# Patient Record
Sex: Male | Born: 1940 | Race: White | Hispanic: No | Marital: Married | Smoking: Former smoker
Health system: Southern US, Community
[De-identification: ages and names within clinical notes are randomized; demographics above are authoritative.]

## PROBLEM LIST (undated history)

## (undated) DIAGNOSIS — F29 Unspecified psychosis not due to a substance or known physiological condition: Secondary | ICD-10-CM

## (undated) DIAGNOSIS — I6529 Occlusion and stenosis of unspecified carotid artery: Secondary | ICD-10-CM

## (undated) DIAGNOSIS — I4891 Unspecified atrial fibrillation: Secondary | ICD-10-CM

## (undated) DIAGNOSIS — F0390 Unspecified dementia without behavioral disturbance: Secondary | ICD-10-CM

## (undated) DIAGNOSIS — F419 Anxiety disorder, unspecified: Secondary | ICD-10-CM

## (undated) DIAGNOSIS — G4700 Insomnia, unspecified: Secondary | ICD-10-CM

## (undated) DIAGNOSIS — I471 Supraventricular tachycardia, unspecified: Secondary | ICD-10-CM

## (undated) DIAGNOSIS — J449 Chronic obstructive pulmonary disease, unspecified: Secondary | ICD-10-CM

## (undated) DIAGNOSIS — I10 Essential (primary) hypertension: Secondary | ICD-10-CM

## (undated) DIAGNOSIS — Z860100 Personal history of colon polyps, unspecified: Secondary | ICD-10-CM

## (undated) DIAGNOSIS — I219 Acute myocardial infarction, unspecified: Secondary | ICD-10-CM

## (undated) DIAGNOSIS — M1990 Unspecified osteoarthritis, unspecified site: Secondary | ICD-10-CM

## (undated) DIAGNOSIS — R040 Epistaxis: Secondary | ICD-10-CM

## (undated) DIAGNOSIS — F329 Major depressive disorder, single episode, unspecified: Secondary | ICD-10-CM

## (undated) DIAGNOSIS — I2510 Atherosclerotic heart disease of native coronary artery without angina pectoris: Secondary | ICD-10-CM

## (undated) DIAGNOSIS — K649 Unspecified hemorrhoids: Secondary | ICD-10-CM

## (undated) DIAGNOSIS — K219 Gastro-esophageal reflux disease without esophagitis: Secondary | ICD-10-CM

## (undated) DIAGNOSIS — R7302 Impaired glucose tolerance (oral): Secondary | ICD-10-CM

## (undated) DIAGNOSIS — G4730 Sleep apnea, unspecified: Secondary | ICD-10-CM

## (undated) DIAGNOSIS — E785 Hyperlipidemia, unspecified: Secondary | ICD-10-CM

## (undated) DIAGNOSIS — C4490 Unspecified malignant neoplasm of skin, unspecified: Secondary | ICD-10-CM

## (undated) DIAGNOSIS — R413 Other amnesia: Secondary | ICD-10-CM

## (undated) DIAGNOSIS — F32A Depression, unspecified: Secondary | ICD-10-CM

## (undated) HISTORY — PX: OTHER SURGICAL HISTORY: SHX169

## (undated) HISTORY — DX: Occlusion and stenosis of unspecified carotid artery: I65.29

## (undated) HISTORY — DX: Sleep apnea, unspecified: G47.30

## (undated) HISTORY — DX: Supraventricular tachycardia: I47.1

## (undated) HISTORY — DX: Anxiety disorder, unspecified: F41.9

## (undated) HISTORY — DX: Unspecified osteoarthritis, unspecified site: M19.90

## (undated) HISTORY — DX: Gastro-esophageal reflux disease without esophagitis: K21.9

## (undated) HISTORY — PX: EYE SURGERY: SHX253

## (undated) HISTORY — DX: Hyperlipidemia, unspecified: E78.5

## (undated) HISTORY — DX: Unspecified malignant neoplasm of skin, unspecified: C44.90

## (undated) HISTORY — DX: Supraventricular tachycardia, unspecified: I47.10

## (undated) HISTORY — PX: COLONOSCOPY: SHX174

## (undated) HISTORY — PX: FINGER SURGERY: SHX640

## (undated) HISTORY — DX: Atherosclerotic heart disease of native coronary artery without angina pectoris: I25.10

## (undated) HISTORY — PX: TONSILLECTOMY AND ADENOIDECTOMY: SUR1326

## (undated) HISTORY — DX: Essential (primary) hypertension: I10

## (undated) HISTORY — PX: BACK SURGERY: SHX140

## (undated) HISTORY — PX: CIRCUMCISION: SUR203

## (undated) HISTORY — DX: Impaired glucose tolerance (oral): R73.02

## (undated) HISTORY — DX: Chronic obstructive pulmonary disease, unspecified: J44.9

---

## 1968-08-06 HISTORY — PX: HEMORRHOID SURGERY: SHX153

## 1976-08-06 HISTORY — PX: CERVICAL SPINE SURGERY: SHX589

## 1998-03-28 ENCOUNTER — Ambulatory Visit (HOSPITAL_COMMUNITY): Admission: RE | Admit: 1998-03-28 | Discharge: 1998-03-28 | Payer: Self-pay | Admitting: Gastroenterology

## 1999-08-24 ENCOUNTER — Encounter: Admission: RE | Admit: 1999-08-24 | Discharge: 1999-08-24 | Payer: Self-pay

## 2000-02-18 ENCOUNTER — Emergency Department (HOSPITAL_COMMUNITY): Admission: EM | Admit: 2000-02-18 | Discharge: 2000-02-18 | Payer: Self-pay | Admitting: Emergency Medicine

## 2000-02-18 ENCOUNTER — Encounter: Payer: Self-pay | Admitting: Emergency Medicine

## 2000-07-01 ENCOUNTER — Inpatient Hospital Stay (HOSPITAL_COMMUNITY): Admission: EM | Admit: 2000-07-01 | Discharge: 2000-07-03 | Payer: Self-pay | Admitting: Emergency Medicine

## 2000-07-01 ENCOUNTER — Encounter: Payer: Self-pay | Admitting: Emergency Medicine

## 2002-10-31 ENCOUNTER — Emergency Department (HOSPITAL_COMMUNITY): Admission: EM | Admit: 2002-10-31 | Discharge: 2002-10-31 | Payer: Self-pay | Admitting: Emergency Medicine

## 2003-02-09 ENCOUNTER — Encounter: Payer: Self-pay | Admitting: Family Medicine

## 2003-02-09 ENCOUNTER — Ambulatory Visit (HOSPITAL_COMMUNITY): Admission: RE | Admit: 2003-02-09 | Discharge: 2003-02-09 | Payer: Self-pay | Admitting: Family Medicine

## 2004-06-27 ENCOUNTER — Ambulatory Visit: Payer: Self-pay | Admitting: Internal Medicine

## 2004-08-08 ENCOUNTER — Observation Stay (HOSPITAL_COMMUNITY): Admission: EM | Admit: 2004-08-08 | Discharge: 2004-08-09 | Payer: Self-pay | Admitting: Emergency Medicine

## 2004-08-08 ENCOUNTER — Ambulatory Visit: Payer: Self-pay | Admitting: Cardiovascular Disease

## 2004-08-09 ENCOUNTER — Ambulatory Visit: Payer: Self-pay

## 2004-08-09 ENCOUNTER — Ambulatory Visit: Payer: Self-pay | Admitting: Internal Medicine

## 2004-08-14 ENCOUNTER — Ambulatory Visit (HOSPITAL_COMMUNITY): Admission: RE | Admit: 2004-08-14 | Discharge: 2004-08-14 | Payer: Self-pay | Admitting: Internal Medicine

## 2004-08-22 ENCOUNTER — Ambulatory Visit: Payer: Self-pay | Admitting: Internal Medicine

## 2004-08-30 ENCOUNTER — Inpatient Hospital Stay (HOSPITAL_COMMUNITY): Admission: EM | Admit: 2004-08-30 | Discharge: 2004-09-03 | Payer: Self-pay | Admitting: Emergency Medicine

## 2004-08-30 ENCOUNTER — Ambulatory Visit: Payer: Self-pay | Admitting: *Deleted

## 2004-09-21 ENCOUNTER — Ambulatory Visit: Payer: Self-pay | Admitting: Internal Medicine

## 2004-11-03 ENCOUNTER — Ambulatory Visit: Payer: Self-pay | Admitting: Internal Medicine

## 2004-11-15 ENCOUNTER — Ambulatory Visit: Payer: Self-pay | Admitting: Internal Medicine

## 2004-12-26 ENCOUNTER — Ambulatory Visit: Payer: Self-pay | Admitting: Internal Medicine

## 2005-02-01 ENCOUNTER — Ambulatory Visit: Payer: Self-pay | Admitting: Internal Medicine

## 2005-02-16 ENCOUNTER — Ambulatory Visit: Payer: Self-pay | Admitting: Internal Medicine

## 2005-02-19 ENCOUNTER — Ambulatory Visit: Payer: Self-pay | Admitting: Internal Medicine

## 2005-04-18 ENCOUNTER — Ambulatory Visit: Payer: Self-pay | Admitting: Cardiology

## 2005-05-22 ENCOUNTER — Ambulatory Visit: Payer: Self-pay | Admitting: Internal Medicine

## 2005-06-07 ENCOUNTER — Ambulatory Visit: Payer: Self-pay | Admitting: Internal Medicine

## 2005-06-14 ENCOUNTER — Ambulatory Visit: Payer: Self-pay | Admitting: Internal Medicine

## 2005-06-14 ENCOUNTER — Ambulatory Visit: Admission: RE | Admit: 2005-06-14 | Discharge: 2005-06-14 | Payer: Self-pay | Admitting: Internal Medicine

## 2005-06-14 ENCOUNTER — Encounter (INDEPENDENT_AMBULATORY_CARE_PROVIDER_SITE_OTHER): Payer: Self-pay | Admitting: Specialist

## 2005-06-18 ENCOUNTER — Ambulatory Visit: Payer: Self-pay | Admitting: Internal Medicine

## 2005-06-19 ENCOUNTER — Ambulatory Visit: Payer: Self-pay | Admitting: Internal Medicine

## 2005-09-10 ENCOUNTER — Ambulatory Visit: Payer: Self-pay | Admitting: Internal Medicine

## 2005-09-14 ENCOUNTER — Ambulatory Visit: Payer: Self-pay | Admitting: Internal Medicine

## 2005-09-20 ENCOUNTER — Ambulatory Visit: Payer: Self-pay

## 2005-11-02 ENCOUNTER — Inpatient Hospital Stay (HOSPITAL_COMMUNITY): Admission: EM | Admit: 2005-11-02 | Discharge: 2005-11-03 | Payer: Self-pay | Admitting: Emergency Medicine

## 2005-11-02 ENCOUNTER — Ambulatory Visit: Payer: Self-pay | Admitting: *Deleted

## 2005-11-16 ENCOUNTER — Ambulatory Visit: Payer: Self-pay | Admitting: Cardiology

## 2006-01-01 ENCOUNTER — Ambulatory Visit: Payer: Self-pay | Admitting: Internal Medicine

## 2006-01-03 ENCOUNTER — Emergency Department (HOSPITAL_COMMUNITY): Admission: EM | Admit: 2006-01-03 | Discharge: 2006-01-03 | Payer: Self-pay | Admitting: Emergency Medicine

## 2006-01-29 ENCOUNTER — Ambulatory Visit: Payer: Self-pay | Admitting: Pulmonary Disease

## 2006-02-21 ENCOUNTER — Ambulatory Visit (HOSPITAL_BASED_OUTPATIENT_CLINIC_OR_DEPARTMENT_OTHER): Admission: RE | Admit: 2006-02-21 | Discharge: 2006-02-21 | Payer: Self-pay | Admitting: Pulmonary Disease

## 2006-02-21 ENCOUNTER — Ambulatory Visit: Payer: Self-pay | Admitting: Pulmonary Disease

## 2006-03-19 ENCOUNTER — Ambulatory Visit: Payer: Self-pay | Admitting: Pulmonary Disease

## 2006-04-03 ENCOUNTER — Ambulatory Visit: Payer: Self-pay | Admitting: Internal Medicine

## 2006-04-04 ENCOUNTER — Ambulatory Visit (HOSPITAL_COMMUNITY): Admission: RE | Admit: 2006-04-04 | Discharge: 2006-04-05 | Payer: Self-pay | Admitting: Orthopedic Surgery

## 2006-04-19 ENCOUNTER — Ambulatory Visit: Payer: Self-pay

## 2006-04-22 ENCOUNTER — Ambulatory Visit: Payer: Self-pay | Admitting: Pulmonary Disease

## 2006-10-10 ENCOUNTER — Observation Stay (HOSPITAL_COMMUNITY): Admission: RE | Admit: 2006-10-10 | Discharge: 2006-10-11 | Payer: Self-pay | Admitting: Orthopedic Surgery

## 2006-12-27 ENCOUNTER — Ambulatory Visit: Payer: Self-pay | Admitting: Internal Medicine

## 2007-01-10 ENCOUNTER — Ambulatory Visit: Payer: Self-pay | Admitting: Internal Medicine

## 2007-01-10 LAB — CONVERTED CEMR LAB
ALT: 19 units/L (ref 0–40)
AST: 19 units/L (ref 0–37)
Alkaline Phosphatase: 56 units/L (ref 39–117)
Bilirubin, Direct: 0.1 mg/dL (ref 0.0–0.3)
CO2: 29 meq/L (ref 19–32)
Calcium: 9.2 mg/dL (ref 8.4–10.5)
Chloride: 104 meq/L (ref 96–112)
Cholesterol: 130 mg/dL (ref 0–200)
Creatinine, Ser: 0.9 mg/dL (ref 0.4–1.5)
GFR calc Af Amer: 110 mL/min
Glucose, Bld: 104 mg/dL — ABNORMAL HIGH (ref 70–99)
Total Bilirubin: 1 mg/dL (ref 0.3–1.2)
Total CHOL/HDL Ratio: 2.5
Total Protein: 6.2 g/dL (ref 6.0–8.3)

## 2007-05-30 ENCOUNTER — Ambulatory Visit: Payer: Self-pay | Admitting: Internal Medicine

## 2007-05-30 LAB — CONVERTED CEMR LAB
ALT: 19 units/L (ref 0–53)
Albumin: 3.9 g/dL (ref 3.5–5.2)
Alkaline Phosphatase: 43 units/L (ref 39–117)
BUN: 15 mg/dL (ref 6–23)
CO2: 30 meq/L (ref 19–32)
Calcium: 9.1 mg/dL (ref 8.4–10.5)
Creatinine, Ser: 1.1 mg/dL (ref 0.4–1.5)
Total Bilirubin: 1 mg/dL (ref 0.3–1.2)
Total Protein: 6.2 g/dL (ref 6.0–8.3)
VLDL: 13 mg/dL (ref 0–40)

## 2007-07-08 ENCOUNTER — Ambulatory Visit: Payer: Self-pay | Admitting: Internal Medicine

## 2008-01-26 ENCOUNTER — Ambulatory Visit: Payer: Self-pay | Admitting: Internal Medicine

## 2008-03-30 ENCOUNTER — Ambulatory Visit: Payer: Self-pay | Admitting: Internal Medicine

## 2008-05-13 ENCOUNTER — Ambulatory Visit (HOSPITAL_COMMUNITY): Admission: RE | Admit: 2008-05-13 | Discharge: 2008-05-13 | Payer: Self-pay | Admitting: Ophthalmology

## 2008-05-18 ENCOUNTER — Ambulatory Visit: Payer: Self-pay | Admitting: Internal Medicine

## 2008-05-18 LAB — CONVERTED CEMR LAB
AST: 21 units/L (ref 0–37)
Albumin: 3.9 g/dL (ref 3.5–5.2)
BUN: 17 mg/dL (ref 6–23)
Chloride: 105 meq/L (ref 96–112)
Cholesterol: 123 mg/dL (ref 0–200)
Creatinine, Ser: 1.1 mg/dL (ref 0.4–1.5)
GFR calc Af Amer: 86 mL/min
GFR calc non Af Amer: 71 mL/min
LDL Cholesterol: 66 mg/dL (ref 0–99)
Potassium: 4.3 meq/L (ref 3.5–5.1)
Total Bilirubin: 1 mg/dL (ref 0.3–1.2)
Total CHOL/HDL Ratio: 2.7
Total CK: 65 units/L (ref 7–195)
Triglycerides: 55 mg/dL (ref 0–149)
VLDL: 11 mg/dL (ref 0–40)

## 2008-06-17 ENCOUNTER — Ambulatory Visit (HOSPITAL_COMMUNITY): Admission: RE | Admit: 2008-06-17 | Discharge: 2008-06-17 | Payer: Self-pay | Admitting: Ophthalmology

## 2008-10-06 ENCOUNTER — Encounter (INDEPENDENT_AMBULATORY_CARE_PROVIDER_SITE_OTHER): Payer: Self-pay | Admitting: *Deleted

## 2008-11-09 ENCOUNTER — Ambulatory Visit: Payer: Self-pay | Admitting: Internal Medicine

## 2008-11-09 LAB — CONVERTED CEMR LAB
Albumin: 3.8 g/dL (ref 3.5–5.2)
CO2: 30 meq/L (ref 19–32)
Chloride: 107 meq/L (ref 96–112)
Cholesterol: 114 mg/dL (ref 0–200)
HDL: 44.3 mg/dL (ref >39.00)
LDL Cholesterol: 62 mg/dL (ref 0–99)
Potassium: 4.6 meq/L (ref 3.5–5.1)
Total CHOL/HDL Ratio: 3
Total Protein: 6.1 g/dL (ref 6.0–8.3)
Triglycerides: 40 mg/dL (ref 0.0–149.0)
VLDL: 8 mg/dL (ref 0.0–40.0)

## 2008-11-10 ENCOUNTER — Encounter: Payer: Self-pay | Admitting: Internal Medicine

## 2008-11-10 ENCOUNTER — Ambulatory Visit: Payer: Self-pay | Admitting: Internal Medicine

## 2008-11-10 DIAGNOSIS — E785 Hyperlipidemia, unspecified: Secondary | ICD-10-CM | POA: Insufficient documentation

## 2008-11-10 DIAGNOSIS — I10 Essential (primary) hypertension: Secondary | ICD-10-CM

## 2008-11-10 DIAGNOSIS — I2510 Atherosclerotic heart disease of native coronary artery without angina pectoris: Secondary | ICD-10-CM

## 2008-12-01 ENCOUNTER — Encounter: Payer: Self-pay | Admitting: Internal Medicine

## 2009-09-21 ENCOUNTER — Telehealth: Payer: Self-pay | Admitting: Internal Medicine

## 2009-10-31 ENCOUNTER — Ambulatory Visit: Payer: Self-pay | Admitting: Internal Medicine

## 2009-11-08 ENCOUNTER — Ambulatory Visit: Payer: Self-pay | Admitting: Internal Medicine

## 2009-11-08 LAB — CONVERTED CEMR LAB
Albumin: 3.9 g/dL (ref 3.5–5.2)
Alkaline Phosphatase: 47 units/L (ref 39–117)
CO2: 30 meq/L (ref 19–32)
Chloride: 105 meq/L (ref 96–112)
Cholesterol: 141 mg/dL (ref 0–200)
Creatinine, Ser: 1.1 mg/dL (ref 0.4–1.5)
HDL: 69.1 mg/dL (ref >39.00)
LDL Cholesterol: 63 mg/dL (ref 0–99)
Potassium: 5 meq/L (ref 3.5–5.1)
Sodium: 141 meq/L (ref 135–145)
Total Protein: 6.9 g/dL (ref 6.0–8.3)
Triglycerides: 46 mg/dL (ref 0.0–149.0)
VLDL: 9.2 mg/dL (ref 0.0–40.0)

## 2010-01-16 ENCOUNTER — Telehealth: Payer: Self-pay | Admitting: Internal Medicine

## 2010-01-31 ENCOUNTER — Ambulatory Visit: Payer: Self-pay | Admitting: Internal Medicine

## 2010-04-27 ENCOUNTER — Ambulatory Visit (HOSPITAL_COMMUNITY): Admission: RE | Admit: 2010-04-27 | Discharge: 2010-04-27 | Payer: Self-pay | Admitting: Orthopedic Surgery

## 2010-07-20 ENCOUNTER — Telehealth (INDEPENDENT_AMBULATORY_CARE_PROVIDER_SITE_OTHER): Payer: Self-pay | Admitting: *Deleted

## 2010-07-25 ENCOUNTER — Ambulatory Visit (HOSPITAL_COMMUNITY)
Admission: RE | Admit: 2010-07-25 | Discharge: 2010-07-26 | Payer: Self-pay | Source: Home / Self Care | Attending: Neurosurgery | Admitting: Neurosurgery

## 2010-09-07 NOTE — Assessment & Plan Note (Signed)
Summary: 1 yr f/u  Medications Added FLEXERIL 5 MG TABS (CYCLOBENZAPRINE HCL) as needed      Allergies Added: NKDA  Visit Type:  Follow-up Primary Provider:  Dr Hart Robinsons  CC:  chest pain 1 time very short.  History of Present Illness: [name] [name] is a very pleasant 70 year old male with a history of coronary artery disease, status post Taxus drug-eluting stent to the LAD and left circumflex in January 2006.  Catheterization on March 2007 showed patent stents with an EF of 60%.  He also has COPD, hyperlipidemia, glucose intolerance, obstructive sleep apnea on CPAP and history of SVT status post ablation and bradycardia, which has prohibited beta-blocker therapy. Returns today for routine f/u.  Doing well. Still working, active. No SOB. No claudication. Tolerating meds well.  A couple of weeks ago. Had episode of transient chest discomfort. lasted a few seconds and resolved. Walking dog 2 miles every day. No CP or claudication. No change in exercise tolerance.  Most recent lipids. T41 TG 46 HDL 69 LDL 63    Current Medications (verified): 1)  Multivitamins   Tabs (Multiple Vitamin) .... Once Daily 2)  Vitamin C 500 Mg  Tabs (Ascorbic Acid) .... Once Daily 3)  Fish Oil   Oil (Fish Oil) .... Once Daily 4)  Garlic .... Once Daily 5)  Aspirin 81 Mg Tbec (Aspirin) .... Take One Tablet By Mouth Daily 6)  Lipitor 20 Mg Tabs (Atorvastatin Calcium) .... Take One Tablet By Mouth Daily. 7)  Niaspan 1000 Mg Cr-Tabs (Niacin (Antihyperlipidemic)) .... Once Daily 8)  Lexapro 20 Mg Tabs (Escitalopram Oxalate) .Marland Kitchen.. 1 Tabs By Mouth Once Daily 9)  Protonix 40 Mg Tbec (Pantoprazole Sodium) .... Once Daily 10)  Nitroglycerin 0.4 Mg Subl (Nitroglycerin) .... One Tablet Under Tongue Every 5 Minutes As Needed For Chest Pain---May Repeat Times Three 11)  Plavix 75 Mg Tabs (Clopidogrel Bisulfate) .... Take One Tablet By Mouth Daily 12)  Lorazepam 1 Mg Tabs (Lorazepam) .... At Bedtime and As Needed 13)   L-Lysine Hcl 500 Mg Tabs (Lysine Hcl) .... Once Daily 14)  Calcium Carbonate-Vitamin D 600-400 Mg-Unit  Tabs (Calcium Carbonate-Vitamin D) .... 2 Once Daily 15)  Flexeril 5 Mg Tabs (Cyclobenzaprine Hcl) .... As Needed  Allergies (verified): No Known Drug Allergies  Past History:  Past Medical History: Last updated: 09/17/2008 1. Coronary artery disease     a. Taxus DES to LAD & LCX January 2006.       b.  Cath 2/07  patent stents with an EF of 60%.   2. Hypertension 3. Hyperlipidemia     a. Myalgias with statin  4. SVT with an ablation in 1995    --residual bradycardia prohibiting beta-blocker 5. COPD followed by Dr. Sherene Sires 6. Osteoarthritis 7. Glucose intolerance 8. GERD. 9. Anxiety 10. Sleep Apnea  Review of Systems       As per HPI and past medical history; otherwise all systems negative.   Vital Signs:  Patient profile:   70 year old male Height:      68 inches Weight:      170 pounds BMI:     25.94 Pulse rate:   72 / minute BP sitting:   108 / 78  (left arm) Cuff size:   regular  Vitals Entered By: Hardin Negus, RMA (November 08, 2009 4:50 PM)  Physical Exam  General:  Gen: well appearing. no resp difficulty HEENT: normal Neck: supple. no JVD. Carotids 2+ bilat; not bruits. No lymphadenopathy or  thryomegaly appreciated. Cor: PMI nondisplaced. Regular rate & rhythm. No rubs, gallops, murmur. Lungs: clear Abdomen: soft, nontender, nondistended. No hepatosplenomegaly. No bruits or masses. Good bowel sounds. Extremities: no cyanosis, clubbing, rash, edema Neuro: alert & orientedx3, cranial nerves grossly intact. moves all 4 extremities w/o difficulty. affect pleasant    Impression & Recommendations:  Problem # 1:  CAD, NATIVE VESSEL (ICD-414.01) Stable. No evidence of ischemia. Brief episode of CP seems non-cardiac. Continue current regimen. If CP recurs can consider Myoview.  Problem # 2:  HYPERLIPIDEMIA (ICD-272.4) Lipids look great. LDL < 70.  Problem  # 3:  HYPERTENSION, BENIGN (ICD-401.1) Blood pressure well controlled. Continue current regimen.  Other Orders: EKG w/ Interpretation (93000)  Patient Instructions: 1)  Follow up in 1 year

## 2010-09-07 NOTE — Progress Notes (Signed)
Summary: Records Request   Faxed Stress to [name] (Per [name]) at [facility] ([phone]). [name]  July 20, 2010 11:28 AM

## 2010-09-07 NOTE — Progress Notes (Signed)
Summary: lab work   Phone Note Call from Patient Call back at Work Phone [phone]   Caller: Patient Reason for Call: Talk to Nurse Summary of Call: request lab work prior to appt in April Initial call taken by: Migdalia Dk,  September 21, 2009 11:27 AM  Follow-up for Phone Call        spoke w/pt labs sch for 3/28 Meredith Staggers, RN  September 21, 2009 12:20 PM

## 2010-09-07 NOTE — Progress Notes (Signed)
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  Discussed with Dr Gala Romney pt to have a reg GXT with him.  Will set up soon Dennis Bast, RN, BSN  January 17, 2010 12:27 PM

## 2010-10-16 LAB — CBC
HCT: 38.1 % — ABNORMAL LOW (ref 39.0–52.0)
Platelets: 184 10*3/uL (ref 150–400)
RBC: 4.16 MIL/uL — ABNORMAL LOW (ref 4.22–5.81)
RDW: 12.7 % (ref 11.5–15.5)
WBC: 4.3 10*3/uL (ref 4.0–10.5)

## 2010-10-16 LAB — SURGICAL PCR SCREEN: MRSA, PCR: NEGATIVE

## 2010-10-16 LAB — BASIC METABOLIC PANEL
BUN: 11 mg/dL (ref 6–23)
Creatinine, Ser: 0.94 mg/dL (ref 0.4–1.5)
GFR calc Af Amer: >60 mL/min (ref >60)
GFR calc non Af Amer: >60 mL/min (ref >60)
Potassium: 4.4 mEq/L (ref 3.5–5.1)

## 2010-10-18 ENCOUNTER — Other Ambulatory Visit (HOSPITAL_COMMUNITY): Payer: Self-pay | Admitting: *Deleted

## 2010-10-18 ENCOUNTER — Ambulatory Visit (HOSPITAL_COMMUNITY)
Admission: RE | Admit: 2010-10-18 | Discharge: 2010-10-18 | Disposition: A | Discharge status: Home / Self Care | Payer: Medicare Other | Source: Ambulatory Visit | Attending: *Deleted | Admitting: *Deleted

## 2010-10-18 DIAGNOSIS — R05 Cough: Secondary | ICD-10-CM | POA: Insufficient documentation

## 2010-10-18 DIAGNOSIS — R059 Cough, unspecified: Secondary | ICD-10-CM | POA: Insufficient documentation

## 2010-11-15 ENCOUNTER — Ambulatory Visit: Payer: Self-pay | Admitting: Internal Medicine

## 2010-12-01 ENCOUNTER — Encounter: Payer: Self-pay | Admitting: Internal Medicine

## 2010-12-04 ENCOUNTER — Encounter: Payer: Self-pay | Admitting: Internal Medicine

## 2010-12-04 ENCOUNTER — Ambulatory Visit (INDEPENDENT_AMBULATORY_CARE_PROVIDER_SITE_OTHER): Payer: Medicare Other | Admitting: Internal Medicine

## 2010-12-04 VITALS — BP 122/76 | HR 80 | Ht 68.0 in | Wt 165.0 lb

## 2010-12-04 DIAGNOSIS — I6529 Occlusion and stenosis of unspecified carotid artery: Secondary | ICD-10-CM

## 2010-12-04 DIAGNOSIS — E785 Hyperlipidemia, unspecified: Secondary | ICD-10-CM

## 2010-12-04 DIAGNOSIS — I6523 Occlusion and stenosis of bilateral carotid arteries: Secondary | ICD-10-CM | POA: Insufficient documentation

## 2010-12-04 DIAGNOSIS — I10 Essential (primary) hypertension: Secondary | ICD-10-CM

## 2010-12-04 DIAGNOSIS — I2510 Atherosclerotic heart disease of native coronary artery without angina pectoris: Secondary | ICD-10-CM

## 2010-12-04 NOTE — Progress Notes (Signed)
HPI:  [name] [name] is a very pleasant 70 year old male with a history of coronary artery disease, status post Taxus drug-eluting stent to the LAD and left circumflex in January 2006.  Catheterization on March 2007 showed patent stents with an EF of 60%.  He also has COPD, hyperlipidemia, glucose intolerance, obstructive sleep apnea on CPAP and history of SVT status post ablation and bradycardia, which has prohibited beta-blocker therapy.  Negative ETT 6/11. (9:00 on [place]) Returns today for routine f/u.  Doing well. Back to work driving a truck. Walking dog 2 miles per day. No SOB. No claudication. Tolerating meds well.  Had carotid u/s at neurologist in [place] (w/u of family h/o Alzheimer's). Told carotid flow was "slow". Doesn't know precentages.  Most recent lipids. TC 140  TG 47 HDL 71 LDL 48   ROS: All systems negative except as listed in HPI, PMH and Problem List.  Past Medical History  Diagnosis Date  . Coronary artery disease     taxes DES to LAD & disease/ Cath 2/07 patents stents with an EF 60%                 t  . Hypertension   . Hyperlipidemia     myagias with statin  . SVT (supraventricular tachycardia)     residual braycardia prohibiting beta blocker  . SVT (supraventricular tachycardia)   . COPD (chronic obstructive pulmonary disease)     followed by DR Sherene Sires  . Sleep apnea   . Anxiety   . GERD (gastroesophageal reflux disease)   . Glucose intolerance (impaired glucose tolerance)   . Osteoarthritis     Current Outpatient Prescriptions  Medication Sig Dispense Refill  . Ascorbic Acid (VITAMIN C) 500 MG tablet Take 500 mg by mouth daily.        Marland Kitchen aspirin 81 MG tablet Take 81 mg by mouth daily.        Marland Kitchen atorvastatin (LIPITOR) 20 MG tablet Take 20 mg by mouth daily.        . calcium carbonate 200 MG capsule Take 250 mg by mouth 2 (two) times daily with a meal.        . clopidogrel (PLAVIX) 75 MG tablet Take 75 mg by mouth daily.        . Coconut Oil OIL 2 mLs by Does  not apply route daily.        . cyclobenzaprine (FLEXERIL) 5 MG tablet Take 5 mg by mouth 3 (three) times daily as needed.        Marland Kitchen escitalopram (LEXAPRO) 20 MG tablet Take 20 mg by mouth daily.        . fish oil-omega-3 fatty acids 1000 MG capsule Take 2 g by mouth daily.        . Garlic 100 MG TABS Take by mouth.        Marland Kitchen LORazepam (ATIVAN) 1 MG tablet Take 1 mg by mouth every 8 (eight) hours.        . Lysine 500 MG CAPS Take by mouth.        . Multiple Vitamin (MULTIVITAMIN) capsule Take 1 capsule by mouth daily.        . niacin (NIASPAN) 1000 MG CR tablet Take 2,000 mg by mouth.       . pantoprazole (PROTONIX) 40 MG tablet Take 40 mg by mouth daily.        . Tamsulosin HCl (FLOMAX) 0.4 MG CAPS Take 0.4 mg by mouth.  PHYSICAL EXAM: Filed Vitals:   12/04/10 1058  BP: 122/76  Pulse: 80   General:  Well appearing. No resp difficulty HEENT: normal Neck: supple. JVP flat. Carotids 2+ bilaterally; no bruits. No lymphadenopathy or thryomegaly appreciated. Cor: PMI normal. Regular rate & rhythm. No rubs, gallops or murmurs. Lungs: clear Abdomen: soft, nontender, nondistended. No hepatosplenomegaly. No bruits or masses. Good bowel sounds. Extremities: no cyanosis, clubbing, rash, edema Neuro: alert & orientedx3, cranial nerves grossly intact. Moves all 4 extremities w/o difficulty. Affect pleasant.    ECG: NSR 80 No ST-T wave abnormalities.     ASSESSMENT & PLAN:

## 2010-12-04 NOTE — Assessment & Plan Note (Signed)
Degree of stenosis unclear. Asked him to fax [place] report so we can follow.

## 2010-12-04 NOTE — Assessment & Plan Note (Signed)
Blood pressure well controlled. Continue current regimen.  

## 2010-12-04 NOTE — Assessment & Plan Note (Signed)
No evidence of ischemia. Continue current regimen.   

## 2010-12-04 NOTE — Patient Instructions (Addendum)
Your physician recommends that you schedule a follow-up appointment in: 12 months with Dr. Gala Romney Please have carotid doppler report faxed to [place] at [phone]

## 2010-12-04 NOTE — Assessment & Plan Note (Signed)
Lipids look great. Continue current regimen.  

## 2010-12-19 NOTE — Assessment & Plan Note (Signed)
[facility]                            CARDIOLOGY OFFICE NOTE   NAME:[name] [name], [name] [name]                       MRN:          [id number]  DATE:01/26/2008                            DOB:          1940-08-19    PRIMARY CARE PHYSICIAN:  Dr. Samuel Jester.   INTERVAL HISTORY:  [name] is a delightful 70 year old male with a  history of coronary artery disease status post TAXUS drug-eluting stent  to the LAD and left circumflex in January 2006 and cardiac  catheterization in March 2007 showed patent stents with an EF of 60%.  He also has COPD, hyperlipidemia, glucose intolerance, obstructive sleep  apnea on CPAP, history of SVT, status post ablation, and bradycardia  which has prohibited beta-blocker therapy.   He returns today for routine followup.  He is doing well.  He remains  active without any chest pain or shortness of breath.  Over the last 6  to 8 months, he has been noticing progressive muscle aches which he  suspects is related to his Lipitor.  He does not notice significant  muscle weakness.   CURRENT MEDICATIONS:  1. Multivitamin.  2. Vitamin C.  3. Plavix 75 a day.  4. Fish oil.  5. Aspirin 81.  6. Lipitor 40.  7. Niaspan 1000.  8. Lexapro 40 a day.  9. Protonix 40 a day.   PHYSICAL EXAMINATION:  GENERAL:  He is well appearing, in no acute  distress, ambulatory in the clinic without respiratory difficulty.  VITAL SIGNS:  Blood pressure is 102/80, heart rate 72, and weight is  180.  HEENT:  Normal, except for mild xanthelasma.  NECK:  Supple.  No JVD.  Carotids are 2+ bilateral without bruits.  There is no lymphadenopathy or thyromegaly.  CARDIAC:  PMI is nondisplaced.  Regular rate and rhythm with an S4.  No  murmurs or rubs.  LUNGS:  Clear with mildly decreased breath sounds throughout.  ABDOMEN:  Soft, nontender, and nondistended.  No hepatosplenomegaly.  No  bruits.  No masses.  Good bowel sounds.  EXTREMITIES:  Warm.  No  cyanosis, clubbing, or edema.  No rash.  Good  distal pulses.  NEURO:  Alert and oriented x3.  Cranial nerves II-XII are intact.  Moves  all four extremities without difficulty.  Affect is pleasant.   DIAGNOSTIC STUDY:  Most recent cholesterol shows LDL of 57, total  cholesterol of 126, HDL of 62, and triglycerides of 36.  EKG shows sinus  rhythm, rate 61.  No ST-T wave abnormalities.   ASSESSMENT AND PLAN:  1. Coronary artery disease.  He is doing great.  No evidence of      ischemia.  Continue current therapy.  2. Hyperlipidemia.  Lipids look great; however, he is having some      muscle aches.  I am wondering this could be statin-related      myositis.  I have asked him to stop his Lipitor for 2 weeks.  We      will check a CK level today.  If his CK levels are markedly  elevated, we will have him restart his Lipitor in 2 weeks for a      rechallenge.  3. Hypertension, doing well.  4. Disposition.  He is doing well.  We will see him back in a couple      of months to further evaluate his muscle pain and possible      relationship to his statin.     Bevelyn Buckles. Bensimhon, MD  Electronically Signed    DRB/MedQ  DD: 01/26/2008  DT: 01/27/2008  Job #: [id number]   cc:   Samuel Jester

## 2010-12-19 NOTE — Assessment & Plan Note (Signed)
[facility]                            CARDIOLOGY OFFICE NOTE   NAME:Folkert, OSIRIS ODRISCOLL                       MRN:          [id number]  DATE:12/27/2006                            DOB:          1940/09/29    PRIMARY CARE PHYSICIAN:  Dr. Samuel Jester.   INTERVAL HISTORY:  Mr. Froman is a delightful 70 year old male with a  history of coronary artery disease , status post 2 vessel angioplasty  with a TAXUS drug-eluting stent to the LAD and left circumflex in  January 2006. cardiac catheterization in March of 2007 showed patent  stents with an ejection fraction of 60%. He also has a history of COPD,  hyperlipidemia, glucose intolerance, obstructive sleep apnea on CPAP,  and a history of SVT status post ablation.   He returns today for routine followup. He is doing quite well. He denies  any chest pain or shortness of breath. He remains active walking 2 miles  every night with his dog without any anginal symptoms. He has been  compliant with all of his medications without difficultly.   CURRENT MEDICATIONS:  1. Aspirin 81 mg a day.  2. Plavix 75.  3. Lipitor 40.  4. Niaspan 1000.  5. Protonix 40.  6. Multivitamin.   PHYSICAL EXAMINATION:  He is well-appearing in no acute distress. He  ambulates around the clinic without any respiratory difficultly. Blood  pressure is 112/76, heart rate is 54, weight is 180.  HEENT: Normal except for some mild xanthelasma.  NECK: Supple. There is no JVD. Carotids are 2 + bilaterally without any  bruits. There is no lymphadenopathy or thyromegaly.  CARDIAC: PMI is nondisplaced. He is bradycardic and regular. Plus S4, no  murmur.  LUNGS: Clear.  ABDOMEN: Soft, nontender, nondistended. No hepatosplenomegaly. No  bruits. No masses. Good bowel sounds.  EXTREMITIES: Warm with no cyanosis, clubbing, or edema. No rash, good  distal pulses.  NEURO: Alert and oriented x3. Cranial nerves II-XII are intact. Moves  all 4  extremities without difficultly. Affect is very pleasant.   EKG: Shows sinus bradycardia at a rate of 54. No significant ST-T wave  abnormalities.   ASSESSMENT:  1. Coronary artery disease,  he is doing very well without any      evidence of ischemia. Continue current therapy. Unfortunately he is      not a candidate for beta blocker due to his bradycardia.  2. Hyperlipidemia, lipids are at goal with an HDL of 54, and an LDL of      51. We will check his repeat lipid and liver today.  3. Lower extremity pain, had recent ABI which showed normal blood flow      with 1.2 bilaterally.  4. Hypertension, well controlled.   DISPOSITION:  We will see him back for routine follow up in 6 months.     Bevelyn Buckles. Bensimhon, MD  Electronically Signed    DRB/MedQ  DD: 12/27/2006  DT: 12/27/2006  Job #: [id number]   cc:   Samuel Jester

## 2010-12-19 NOTE — Assessment & Plan Note (Signed)
[facility]                            CARDIOLOGY OFFICE NOTE   NAME:[name] [name]                       MRN:          [id number]  DATE:03/30/2008                            DOB:          [birth year]    PRIMARY CARE PHYSICIAN:  Dr. Samuel Jester.   INTERVAL HISTORY:  [name] [name] is a very pleasant 70 year old male with a  history of coronary artery disease, status post Taxus drug-eluting stent  to the LAD and left circumflex in January 2006.  Catheterization on  March 2007 showed patent stents with an EF of 60%.  He also has COPD,  hyperlipidemia, glucose intolerance, obstructive sleep apnea on CPAP and  history of SVT status post ablation and bradycardia, which has  prohibited beta-blocker therapy.   He returns today for routine followup.  He denies any chest pain or  shortness of breath.  His main complaint is that he is hurting from head  to toe, which he relates to his Lipitor.  He says that he stopped this  for 3 weeks and it went away, now he started back up about a month ago  and it come back again.  This is fairly debilitating for him.  We have  checked a CK level before and this was within the normal range.  He has  not had any muscle weakness.   CURRENT MEDICATIONS:  1. Multivitamin.  2. Vitamin C.  3. Plavix 75 a day.  4. Fish oil.  5. Aspirin 81 a day.  6. Lipitor 40 a day.  7. Niaspan 1000 a day.  8. Lexapro.  9. Pantoprazole.   PHYSICAL EXAMINATION:  GENERAL:  He is in no acute distress, ambulatory  in the clinic without any respiratory difficulty.  VITAL SIGNS:  Blood pressure is 104/68, heart rate 65, weights 181.  HEENT:  Normal except for mild xanthelasmas.  NECK:  Supple.  No JVD.  Carotids are 2+ bilateral without bruits.  There is no lymphadenopathy or thyromegaly.  CARDIAC:  PMI is nondisplaced.  He is regular with no murmurs or rubs.  There is a soft S4.  LUNGS:  Clear.  ABDOMEN:  Soft, nontender, nondistended.  No  hepatosplenomegaly, no  bruits, no masses.  Good bowel sounds.  EXTREMITIES:  Warm with no cyanosis, clubbing, or edema.  No rash.  Good  distal pulses.  NEURO:  Alert and oriented x3.  Cranial nerves II-XII are intact.  Moves  all fours extremities without difficulty.  He has good strength.  Affect  is flat.   ASSESSMENT AND PLAN:  1. Coronary artery disease is stable.  No evidence of ischemia.  2. Hypertension, well controlled.  3. Hyperlipidemia.  He seems to be having myalgias with his statin.      There is no frank evidence of myositis.  We will cut his Lipitor      back to 20 mg a day and see how this does.  If he continues to have      symptoms, we may need to switch him over the low-dose Crestor or      maybe  pravastatin.  We will check a CMET and lipid panel in 2      months.   DISPOSITION:  We will then see him back for routine followup in 6  months.  He knows to call me sooner if there is a problem.     Bevelyn Buckles. Bensimhon, MD  Electronically Signed    DRB/MedQ  DD: 03/30/2008  DT: 03/31/2008  Job #: [id number]

## 2010-12-19 NOTE — Assessment & Plan Note (Signed)
[facility]                            CARDIOLOGY OFFICE NOTE   NAME:Whitehead, ONYX SCHIRMER                       MRN:          [id number]  DATE:07/08/2007                            DOB:          12/30/1940    PRIMARY CARE PHYSICIAN:  Samuel Jester,  M.D.   INTERVAL HISTORY:  Mr. Berkel is a delightful 70 year old male with a  history of coronary artery disease status post two-vessel angioplasty  with a TAXUS drug-eluting stent to the LAD and left circumflex in  January 2006.  Cardiac catheterization in March 2007 showed patent  stents with ejection fraction of 60%.  He also has a history of COPD,  hyperlipidemia, glucose intolerance, obstructive sleep apnea on CPAP,  history of SVT status post ablation, and bradycardia which has  prohibited beta blocker therapy.   He returns today for routine followup.  He is doing well.  He remains  active without any chest pain or shortness of breath.  He has been  compliant with all of his medications.   CURRENT MEDICATIONS:  1. Plavix 75 a day.  2. Fish oil.  3. Aspirin 81.  4. Lipitor 40.  5. Niaspan 1000.  6. Lexapro 40.  7. Protonix 40.   PHYSICAL EXAMINATION:  GENERAL:  He is well appearing, in no acute  distress.  He ambulates around the clinic without any respiratory  difficulty.  VITAL SIGNS:  Blood pressure 115/74, heart rate 63.  Weight is 182.  HEENT:  Normal except for minimal xanthelasmas.  NECK:  Supple, no JVD.  Carotids are 2+ bilaterally without bruits.  There is no lymphadenopathy or thyromegaly.  CARDIAC:  PMI is nondisplaced.  He has regular rate and rhythm.  No  murmurs or rubs.  There is an S4.  LUNGS:  Clear.  ABDOMEN:  Soft, nontender, nondistended.  No hepatosplenomegaly, no  bruits, no masses.  Good bowel sounds.  EXTREMITIES:  Warm with no cyanosis, clubbing, or edema.  No rash.  Good  distal pulses.  NEUROLOGIC:  Alert and oriented x3.  Cranial nerves II-XII intact.  Moves all  four extremities without difficulty.  Affect is pleasant.   EKG shows normal sinus rhythm at a rate of 63.  No ST-T wave  abnormalities.   Total cholesterol is 115, triglycerides 63, HDL 41, LDL 62.   ASSESSMENT AND PLAN:  1. Coronary artery disease.  He is doing well without any evidence of      ischemia.  Continue current therapy.  2. Hyperlipidemia.  Lipids are at goal.  Continue therapy.  3. Hypertension, well controlled.   DISPOSITION:  Overall, he is doing great.  Will see him back in 6 months  for routine followup.     Bevelyn Buckles. Bensimhon, MD  Electronically Signed    DRB/MedQ  DD: 07/08/2007  DT: 07/08/2007  Job #: [id number]   cc:   Samuel Jester

## 2010-12-22 NOTE — Cardiovascular Report (Signed)
NAME:  [name] [name], LARCH.:  [id number]   MEDICAL RECORD NO.:  [id number]          PATIENT TYPE:  INP   LOCATION:  3709                         FACILITY:  [facility]   PHYSICIAN:  Charlies Constable, M.D. [facility] DATE OF BIRTH:  27-Feb-1941   DATE OF PROCEDURE:  11/02/2005  DATE OF DISCHARGE:                              CARDIAC CATHETERIZATION   CLINICAL HISTORY:  [name] [name] is 70 years old and has had known coronary  artery disease.  He had Taxus stents placed in the proximal LAD and proximal  circumflex artery in January 2006, by Dr. Gerri Spore.  He was seen by Dr.  Gala Romney and had Cardiolite scan performed for some atypical chest pain  which was a low-risk scan.  He subsequently had recurrent chest pain which  he described as a pressure and sharp pain, but was nonexertional.  He came  to the emergency room and was seen by Dr. Dorethea Clan and was admitted for  evaluation and angiography.   PROCEDURE:  The procedure was performed via right femoral artery, arterial  sheath and 6-French coronary catheters.  Procedure was performed and  Omnipaque contrast was used.  After placing the diagnostic study, we made a  decision to perform intravascular ultrasound on the LAD lesion which was  hypodense.  The patient was given weighted doses of heparin upon the ACD at  200 seconds.  We used a Q4, 6-French guiding catheter with side holes.  Then  we crossed the area in the proximal LAD with the wire without difficulty.  After nitroglycerin was given, we passed an Atlantis catheter across the  lesion and did automatic pullback.  This did not demonstrate any significant  obstruction or dissection.  Our final diagnostic studies were then performed  through the guiding catheter.  The right femoral artery was closed at the  end of the procedure.  The patient tolerated the procedure well and left the  laboratory in satisfactory condition.   RESULTS:  Left main coronary artery:  The left main  coronary artery was free  of disease.   Left anterior descending artery:  This gave rise to two sets of perforators.  There was 0% stenosis at the stent site and the proximal LAD.  There was 40%  narrowing just distal to the stent.  There was a hypodense lesion in the mid  LAD after the first diagonal branch and first septal perforator.   Circumflex artery:  The circumflex artery gave rise to a large marginal  branch and two small posterolateral branches.  There was 0% stenosis at the  stent site in the proximal circumflex artery.   Right coronary artery:  The right coronary artery is moderate size and gave  rise to conus branch and acute marginal branch which supplied part of the  inferior septum, short posterior descending branch and two posterolateral  branches.  These vessels were free of significant disease.   Left ventriculogram:  The left ventricular showed good wall motion with no  areas of hypokinesis.  The estimated ejection fraction was 60%.   CONCLUSION:  Coronary artery disease, status post  prior percutaneous  coronary interventions as described above with 0% stenosis at the stent site  in the proximal LAD, 40% narrowing in the proximal LAD distal to the stent,  a hypodense area in the mid LAD which was not significantly obstructed by  IVUS, 0% stenosis in the circumflex artery at the stent site in the proximal  circumflex artery, 30% narrowing in the mid right coronary artery and normal  LV function.   RECOMMENDATIONS:  Reassurance.  The IVUS study in the mid LAD showed an  eccentric plaque which I think accounts for the hypodensity.  There was no  evidence of either dissection or significant obstruction.  I will plan  reassurance and keep the patient tonight and let him go home tomorrow.           ______________________________  Charlies Constable, M.D. [facility]     BB/MEDQ  D:  11/02/2005  T:  11/05/2005  Job:  [id number]   cc:   [name] [name]  Fax: [phone]

## 2010-12-22 NOTE — Cardiovascular Report (Signed)
[place]  Patient:    [name] [name], [name] [name]                       MRN: [id number] Proc. Date: 07/02/00 Adm. Date:  [id number] Attending:  Nathen May CC:         Samuel Jester, M.D.  Nathen May, M.D., [facility]  Cardiac catheter lab   Cardiac Catheterization  PROCEDURE:                    Left heart catheterization with coronary angiography, left ventriculography, and intracoronary administration of nitroglycerin.  INDICATION:                   Mr. Bulger is a 70 year old male who presented with prolonged episode of chest pain and was referred for cardiac catheterization.  DESCRIPTION OF PROCEDURE:     A 6 French sheath was placed in the right femoral artery.  A standard Judkins 6 French catheters were utilized. Contrast was Omnipaque for the initial part of the procedure and then changed to hexabrix.  The patient developed significant coronary spasm, particularly of the obtuse marginal branch during the catheterization which was relieved with intracoronary nitroglycerin.  There were no complications.  HEMODYNAMIC DATA: 1. Left ventricular pressure 110/8. 2. Aortic pressure 118/68. 3. There is no aortic valve gradient.  LEFT VENTRICULOGRAM:  Wall motion is normal.  Ejection fraction is calculated at 65%.  CORONARY ARTERIOGRAPHY (RIGHT DOMINANT): 1. Left main has an ostial 40% stenosis. 2. Left anterior descending coronary artery has an ostial 30% stenosis. 3. The mid-vessel has 50% stenosis proximal to the third diagonal and    another 50% stenosis distal to the third diagonal branch. 4. The LAD gives rise to four small diagonal branches. 5. Left circumflex had a 40% stenosis in the proximal vessel and a 30%    stenosis in the mid-vessel.  The distal vessel is relatively small and has    a 40% stenosis just after the origin of the large OM2.  There is a small    OM1, large OM2, and a small OM3.  OM2 had a proximal 20%  stenosis and a    mid-30% stenosis.  The proximal portion of OM2 became progressively tighter    with subsequent coronary injection progressing to 99% stenosis with    TIMI II flow.  Intracoronary nitroglycerin was administered and relieved    this area of spasm completely leaving only a residual 20% stenosis. 6. Right coronary artery had a diffuse 30-40% stenosis in the proximal    vessel and a diffuse 30-40% stenosis in the distal vessel.  The right    coronary artery gives rise to a small posterior descending artery, small    first posterolateral, large second posterolateral, and small third    posterolateral branches.  IMPRESSION: 1. Normal left ventricular systolic function. 2. Moderate fixed coronary artery disease which appears to be nonobstructive. 3. There is a significant component of coronary vasospasm which was relieved    with nitroglycerin.  PLAN:  The patient will be started on long-acting nitrates and calcium channel blockers to prevent coronary spasms.  I would recommending obtaining a stress Cardiolite as an outpatient to assess the significance of the fixed disease particularly in the LAD. DD:  07/02/00 TD:  07/02/00 Job: [id number]

## 2010-12-22 NOTE — Cardiovascular Report (Signed)
NAME:  CHRISTO, HAIN.:  [id number]   MEDICAL RECORD NO.:  [id number]          PATIENT TYPE:  INP   LOCATION:  6599                         FACILITY:  [facility]   PHYSICIAN:  Carole Binning, M.D. LHCDATE OF BIRTH:  09-25-1940   DATE OF PROCEDURE:  09/01/2004  DATE OF DISCHARGE:                              CARDIAC CATHETERIZATION   PROCEDURES PERFORMED:  1.  PTCA with placement of a drug-eluting stent in the proximal left      anterior descending artery.  2.  PTCA with placement of a drug-eluting stent in the proximal left      circumflex coronary artery.   INDICATIONS:  Mr.  Lucarelli is a 70 year old male who presented with unstable  angina.  Cardiac catheterization yesterday by Dr. Eden Emms revealed two-vessel  coronary artery disease with a 90% stenosis in the proximal left anterior  descending artery and a diffuse 80% stenosis in the proximal circumflex  coronary artery.  The patient returned today for planned percutaneous  coronary intervention.   PROCEDURAL NOTE:  A 6 French sheath was placed in the left femoral artery.  The patient had been started on Integrilin and heparin prior to the  procedure.  Additional heparin was administered to maintain an ACT of  greater than 200 seconds.  We used a 6 [place] CLS 3.5 guiding catheter.  An  Asahi soft coronary guide wire was advanced under fluoroscopic guidance into  the distal LAD.  We then performed PTCA of the proximal LAD with a 2.75 x 15  mm Quantum balloon inflated to 14 atmospheres.  We then positioned a 2.75 x  16 mm Taxus drug-eluting stent in the proximal vessel, with the proximal  edge of the stent carefully positioned at the ostium of the left anterior  descending artery.  The stent was deployed at 16 atmospheres.  We then went  in with a 3.5 x 8 mm Quantum balloon and inflated this to 16 atmospheres in  the very proximal edge of the stent and 18 atmospheres in the midportion of  the stent.   Intermittent doses of intracoronary nitroglycerin were  administered.  Angiographic images were obtained revealing patency of the  left anterior descending artery, with 0% residual stenosis at the stent site  and TIMI III flow into the distal vessel.   We then turned our attention to the left circumflex.  The Asahi soft wire  was advanced under fluoroscopic guidance into the distal aspect of the first  obtuse marginal branch.  We then performed PTCA of the proximal circumflex  with a 2.75 x 15 mm Quantum balloon inflated to 8 atmospheres.  Following  this, we positioned a 2.5 x 24 mm Taxus drug-eluting stent across the  diseased segment of vessel.  The proximal edge of the stent was positioned  in ostium of the circumflex.  The distal edge of the stent was positioned  just prior to the bifurcation of the circumflex and the obtuse marginal  branch.  We deployed this stent at 16 atmospheres.  Following this, we went  in with our 2.75 x 15 mm  Quantum balloon and inflated this to 14 atmospheres  in the distal aspect of the stent, 18 atmospheres in the midportion of the  stent, and 22 atmospheres in the proximal portion of the stent.  Finally, we  went back with our 3.5 x 8 mm Quantum balloon and inflated this to 14  atmospheres in the proximal aspect of the stent and 10 atmospheres in the  midportion of the stent.  Intermittent doses of nitroglycerin were  administered.  Final angiographic images were obtained revealing patency of  the left circumflex, with a 0% residual stenosis at the stent site and TIMI  III flow into the distal vessel.   COMPLICATIONS:  None.   RESULTS:  1.  Successful PTCA with placement of a drug-eluting stent in the proximal      left anterior descending artery.  A 90% stenosis was reduced to 0%      residual, with TIMI III flow.  Of note, we did lose a very small      diagonal branch with stent placement.  The patient was having mild chest      discomfort at the  conclusion of the procedure but was hemodynamically      stable.  2.  Successful PTCA with placement of a drug-eluting stent in the proximal      left circumflex coronary artery.  A diffuse 80% stenosis was reduced to      0% residual, with TIMI III flow.   PLAN:  Integrilin will be continued for 18 hours.  The patient will be  treated with Plavix for the recommended one year.  He will also benefit from  aggressive risk factor modification.      MWP/MEDQ  D:  09/01/2004  T:  09/01/2004  Job:  [id number]   cc:   [address]  [place]  [place] [address]  Fax: [phone]   Arvilla Meres, M.D. [facility]   Cardiac Catheterization Laboratory

## 2010-12-22 NOTE — Discharge Summary (Signed)
NAME:  [name] [name], BIVEN.:  [id number]   MEDICAL RECORD NO.:  [id number]          PATIENT TYPE:  INP   LOCATION:  2006                         FACILITY:  [facility]   PHYSICIAN:  Arturo Morton. Riley Kill, M.D. [facility] OF BIRTH:  May 13, 1941   DATE OF ADMISSION:  08/30/2004  DATE OF DISCHARGE:  09/03/2004                                 DISCHARGE SUMMARY   BRIEF HISTORY:  This is a 70 year old male with a history of moderate  coronary artery disease which was felt to be nonobstructive, treated with  medical therapy.  The patient also has a history of vasospasm.  He presented  to the emergency room, at [facility], on August 30, 2004, for  evaluation of chest pain.  He was seen by Dr. Dorethea Clan and admitted to rule  out an MI.   PAST MEDICAL HISTORY:  Significant for coronary artery disease with cardiac  catheterization 2001.  This showed a 40% left main, 50% mid LAD, 40%  circumflex, 30% mid RCA with normal LV function and a question of vasospasm  treated with intracoronary nitroglycerin.   The patient also has a history of supraventricular tachycardia status post  ablation.  He has a history of pulmonary embolus which complicated his  ablation in 1995.  He had a Cardiolite in 2001 that showed no ischemia,  ejection fraction 58%.  He has a history of hypertension, hyperlipidemia,  positive family history of coronary artery disease, history of ongoing  tobacco use, he quit earlier this month.  He has gastroesophageal reflux  disease, history of anxiety, a previous lung nodule that was worked up by  pulmonology.  He is status post C-spine surgery.  He has had some injections  in his thumbs.   ALLERGIES:  NO KNOWN DRUG ALLERGIES.   MEDICATIONS PRIOR TO ADMISSION INCLUDED:  Mobic, Imdur, Prozac, aspirin,  multivitamins, fish oil and Pepcid over-the-counter.   SOCIAL HISTORY:  The patient lives in [place] with his wife.  He works in  the trucking business.  He has a  45 pack year history of tobacco use.  He  denies alcohol or drug use.   FAMILY HISTORY:  His mother died at age 54, his father died at age 110; his  father had Alzheimer's disease.  He has a brother who is alive and well with  some coronary artery disease, he had to have bypass surgery at a relatively  early age.   HOSPITAL COURSE:  As noted, this patient was admitted to [facility]  through the emergency department on August 30, 2004, by Dr. Dorethea Clan for  evaluation of chest pain with history of nonobstructive coronary disease in  the past.  He was scheduled for a cardiac catheterization which was  performed on September 01, 2004, by Dr. Eden Emms.  Please see complete dictated  report from that catheterization.  The patient was found to have diffuse  disease.  The left main had a 30-40% stenosis, the LAD had an 80-90%  proximal stenosis, the circumflex had an 80% stenosis, the RCA had a 30% mid  to distal lesion.  He had a normal ejection fraction of 55%.  Percutaneous  intervention was recommended.   On September 01, 2004, the patient underwent stent placement to the LAD and  circumflex lesions.  The LAD was 90%, it was reduced to 0%.  The circumflex  was 80%, reduced to 0%.  The patient tolerated the procedure well.  Postprocedure he did have a bump in his enzymes, there was a small side  branch occlusion off the LAD.  It was recommended that he stay on Plavix for  at least 1 year.   The plans were for possible discharge on September 02, 2004; however, due to  the bump in his enzymes, it was recommended that he stay an extra day.  He  also had bilateral groin hematomas although there were no bruits  appreciated.  These appeared to be improving.   On September 03, 2004, the patient was feeling well, he denied any chest pain  or shortness of breath, his groin hematomas were resolving, his cardiac  enzymes were coming back down.  The situation was discussed with Dr. Riley Kill  and  arrangements were made to discharge the patient in stable and improved  condition.   LABORATORY DATA:  CK MB enzymes on the day of discharge revealed a CK of 99,  MB was 6.1, on the day prior to this, his CK was 225, MB was 27 with an  index of 12.3, his troponin was 1.54.  An EKG on the day of discharge  revealed sinus rhythm, rate 51 beats per minute with T-wave inversions in  AVL, but was otherwise unremarkable.  A chemistry profile on September 02, 2004, revealed BUN 7, creatinine 1.0, potassium 3.7, glucose 103.  A CBC on  that day revealed hemoglobin 13.3, hematocrit 38.5, WBC 6.3 thousand,  platelets 160,000.  A chest x-ray showed COPD with no acute disease.  I do  not see a lipid profile in the chart at this time.   DISCHARGE MEDICATIONS:  1.  Plavix 75 mg daily for at least 1 year.  2.  Coated aspirin 325 mg daily.  3.  Mobic 15 mg daily.  4.  Imdur 30 mg daily.  5.  Prozac 20 mg daily.  6.  Pepcid as previously taken.  7.  Lipitor 20 mg daily at bedtime.  8.  Nitroglycerin as needed for chest pain.  9.  Multivitamin daily.  10. Fish oil daily.  11. Tylenol as needed.   The patient was told to avoid any strenuous activity for at least one week.  He was not to drive for two days.  He was told to stay on a low fat diet.  He was told to call the office if he had any problems with his groin site.  He was to avoid tobacco.  He was to followup with Dr. Charm Barges as needed, the  office would call him for an appointment with Dr. Gerri Spore.   PROBLEM LIST AT TIME OF DISCHARGE:  1.  Chest pain with cardiac catheterization performed August 31, 2004,      revealing diffuse coronary disease with an ejection fraction of 55%.  2.  Percutaneous transluminal coronary angioplasty stenting of the      circumflex and left anterior descending performed September 01, 2004, by      Dr. Gerri Spore with small side branch occlusion of the left anterior      descending. 3.  Mild bump in cardiac  enzymes due to the side branch occlusion.  4.  History of supraventricular tachycardia status post ablation.  5.  History of pulmonary embolus complicating his ablation in 1995.  6.  History of hypertension.  7.  History of hyperlipidemia.  8.  Positive family history of coronary artery disease.  9.  Tobacco use prior to admission.  10. Gastroesophageal reflux disease.  11. History of anxiety.  12. History of a lung nodule being evaluated by pulmonary.      DR/MEDQ  D:  09/03/2004  T:  09/03/2004  Job:  [id number]   cc:   [address]  [place]  [place] [address]  Fax: [phone]

## 2010-12-22 NOTE — H&P (Signed)
NAME:  [name] [name], [name] [name].:  [id number]   MEDICAL RECORD NO.:  [id number]          PATIENT TYPE:  EMS   LOCATION:  MAJO                         FACILITY:  [facility]   PHYSICIAN:  Charlton Haws, M.D.     DATE OF BIRTH:  02-12-1941   DATE OF ADMISSION:  08/08/2004  DATE OF DISCHARGE:                                HISTORY & PHYSICAL   PRIMARY CARE PHYSICIAN:  Samuel Jester, M.D.   PRIMARY CARDIOLOGIST:  Carole Binning, M.D.   CHIEF COMPLAINT:  Chest pain.   HISTORY OF PRESENT ILLNESS:  Mr. Mckimmy is a 70 year old male with a history  of moderate coronary artery disease, as well as vasospasm.  He was in his  usual state of health until this a.m.  He became angry.  After he became  angry, he was walking and developed sharp substernal chest pain.  It was a 7-  8/10.  He took sublingual nitroglycerin x 2 at first and then another one  prior to EMS arrival.  These did not change his pain.  Walking around did  not change his pain and there was no change with position, deep inspiration  or cough.  He had no chest wall tenderness.  The pain was associated with  shortness of breath and diaphoresis, but no nausea or vomiting.  EMS gave  him four baby aspirin and transported him to the ER where he received IV  nitroglycerin.  His pain is a 1/10 now.  The last time he had symptoms  similar to these were in 2001 prior to his catheterization.  He states that  his symptoms are a buzzing pain in his lower left chest.  He gets them  occasionally and they are always relieved by nitroglycerin.  When he quit  smoking, he was able to completely discontinue the Imdur and the Norvasc and  did not have any symptoms.  However, he restarted smoking and after he  restarting smoking he developed symptoms again.  These are fairly well  controlled by low-dose Imdur.   PAST MEDICAL HISTORY:  1.  Status post cardiac catheterization in 2001 showing 40% left main, LAD      30% proximal, 50% mid  and 50% distal, as well as circumflex 40% and 30%      disease with the RCA at 30%.  He also had vasospasm.  He ruled in for an      MI secondary to spasm.  2.  History of SVT, status post ablation complicated by PE in 1995.  3.  History of a Cardiolite in 2001 that showed no scar or ischemia with EF      58%.  4.  Hypertension.  5.  Hyperlipidemia.  6.  Family history of coronary artery disease.  7.  Ongoing tobacco use.  8.  Gastrointestinal reflux disease symptoms occasionally.  9.  History of anxiety.  10. Osteoarthritis.   PAST SURGICAL HISTORY:  1.  Cardiac catheterization.  2.  SVT ablation.  3.  C spine surgery.  4.  Bilateral thumb injections.   ALLERGIES:  No known drug  allergies.   CURRENT MEDICATIONS:  1.  Mobic 15 mg daily.  2.  Imdur 30 mg one-half tablet daily.  3.  Prozac 20 mg daily.  4.  Aspirin 325 mg daily.  5.  Multivitamins, vitamin C, L-lysine and fish oil daily.  6.  Pepcid Complete daily.   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  He has a greater than 45-pack-year history of tobacco.  Does not abuse alcohol or drugs.   FAMILY HISTORY:  His mother died at age 33 of old age and his father died at  age 41 with Alzheimer's.  He has one brother that is alive and well, but had  bypass surgery, and another brother that is alive and well, but had CVA.   REVIEW OF SYSTEMS:  Significant for the chest pain as described above.  He  has some dyspnea on exertion.  He has occasional coughing and wheezing, but  does not treat this.  He has anxiety that is fairly well controlled by the  Prozac.  He has arthralgias.  He has occasional reflux symptoms.  The review  of systems is otherwise negative.   PHYSICAL EXAMINATION:  VITAL SIGNS:  The temperature is 98.8 degrees, blood  pressure 126/85, heart rate 63, respiratory rate 20 and O2 saturation 100%  on 2 L.  GENERAL APPEARANCE:  He is a well-developed,  well-nourished, white male in  no acute distress.  HEENT:  His head is normocephalic and atraumatic with pupils equal, round  and reactive to light and accommodation.  NECK:  Supple and there is no lymphadenopathy, thyromegaly, bruit or JVD  noted.  CHEST:  He has some dry rales in the bases, but no crackles.  CARDIOVASCULAR:  His heart is regular in rate and rhythm with a 2/6 systolic  ejection murmur noted.  SKIN:  No rashes or lesions are noted.  ABDOMEN:  Soft and nontender with active bowel sounds and no  hepatosplenomegaly by exam.  EXTREMITIES:  There is no cyanosis, clubbing or edema.  MUSCULOSKELETAL:  There no joint deformity or effusions and no spine or CVA  tenderness.  NEUROLOGIC:  He is alert and oriented with cranial nerves II-XII grossly  intact.   LABORATORY DATA:  Chest x-ray:  Mild diffuse peribronchial thickening and  mild left lower lobe atelectasis/scarring, but no acute cardiopulmonary  disease.  EKG:  Sinus rhythm, heart rate 61 with no acute ischemic changes  and no change from an EKG dated 2001.  Laboratory values are pending.   ASSESSMENT AND PLAN:  1.  Chest pain.  Normal EKG and the pain is not like previous spasm.  Will      rule out MI and continue his home dose of Imdur, as well as heparin.      Will check a D-dimer.  If enzymes are negative, outpatient Cardiolite in      the office if he remains stable.  2.  Hyperlipidemia.  Check in a.m.  3.  Tobacco use.  The patient needs to quit and is aware of this.  4.  He is otherwise stable.  Will be continued on his home medications.   Charlton Haws, M.D., saw the patient and determined the plan of care.       RB/MEDQ  D:  08/08/2004  T:  08/08/2004  Job:  [id number]

## 2010-12-22 NOTE — H&P (Signed)
NAME:  [name] [name], FULTS.:  [id number]   MEDICAL RECORD NO.:  [id number]          PATIENT TYPE:  INP   LOCATION:  3709                         FACILITY:  [facility]   PHYSICIAN:  Vida Roller, M.D.   DATE OF BIRTH:  [birth year]   DATE OF ADMISSION:  11/02/2005  DATE OF DISCHARGE:  11/03/2005                                HISTORY & PHYSICAL   SUMMARY OF HISTORY:  [name] [name] is a 70 year old white male who was  transported via EMS to [facility] emergency room secondary to chest  discomfort.   [name] [name] states at approximately 9:00 a.m., while at work doing his usual  morning routine, he suddenly developed a left-sided chest dull aching  sensation.  This did not radiate nor was it associated with shortness of  breath, nausea, vomiting or diaphoresis.  He gave it a 1 on a scale of 0 to  10.  His coworker asked him if he was okay because [name] [name] was rubbing  his chest.  He informed his coworker of his chest discomfort and he was  taken to supervisor's office where he rested.  The company nurse checked his  blood pressure and found this to be in the 140s to 150s over 90s which [name] states is high for him.  EMS was summoned and during transport, he  received IV fluids, O2, and a sublingual nitroglycerin.  The sublingual  nitroglycerin did not improve his discomfort.  He does describe a slight  pleuritic component to the discomfort.  He denies any recent accidents,  chest injuries, tenderness, or change with movement or exertion.  He feels  that the discomfort is different than prior to his stents that he received  in January 2006.  He stated that discomfort felt sharp.  During the stent  procedure, he states that he felt like he had a spinning sensation within  his chest.   PAST MEDICAL HISTORY:  No known drug allergies.   MEDICATIONS:  Prior to admission include:  1.  Vitamin C, unknown dosage every day.  2.  Plavix 75 mg every day.  3.  Multivitamin every  day.  4.  Prilosec 20 mg every day.  5.  Aspirin 81 every day.  6.  Prozac 20 mg every day.  7.  Lipitor 40 mg q.h.s.  8.  Fish oil unknown dose every day.   His history is notable for:  1.  SVT with an ablation in 1995.  This was complicated by pulmonary      embolism.  2.  COPD followed by Dr. Sherene Sires, status post bronchoscopy, however, the      majority of his symptoms have improved since he stopped smoking.  3.  Hyperlipidemia, last checked, in February 2007, showed total cholesterol      105, triglycerides 45, HDL 48, LDL 49.  4.  Osteoarthritis.  5.  Sinus bradycardia which resolved post discontinuation of his beta      blocker.  6.  Glucose intolerance.  He does not check his sugars at home.  7.  Hypertension.  His blood  pressure usually runs at home around 110s over      70s.  8.  GERD.  9.  Anxiety.  10. He first underwent cardiac catheterization in 2001.  This showed a 40%      left main, 50% proximal LAD, 40% circumflex, 30% mid RCA, with normal LV      function.  He did have some vasospasm that was relieved with      intracoronary nitroglycerin.  In January 2006, he was re-hospitalized      with chest discomfort.  He received a drug-eluting stent to the LAD and      circumflex.  EF was 55%.  Postprocedure enzymes were slightly elevated.      He had a residual 30-40% left main disease, mid 30% LAD, 30% first      diagonal, 30 to 40% multiple lesions in the proximal and mid RCA.   SURGICAL HISTORY:  1.  C-spine surgery.  2.  Status post hemorrhoid surgery in the 1970s.  3.  Status post T&A   SOCIAL HISTORY:  He resides in [place] with his wife.  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  He quit smoking in October  2006.  Prior to that, he smoked one pack a day for 45 years.  He denies any  alcohol, drugs.  He does take a herbal medication, __________  unknown  dosage every day.  He tries to maintain a low fat and sodium diet.  He plans  to  start exercising.   FAMILY HISTORY:  His mother died at the age of 20 secondary to old age.  His  father died at age 69 secondary to Alzheimer's.  He has three sisters, two  of which have cancer, another one has arthritis.  He has one brother age 37  that had a bypass surgery in his 29s and a brother age 79 who has had a  stroke.   REVIEW OF SYSTEMS:  Notable for rare nose bleed on the right side, glasses,  dentures, occasional bruising secondary to his work, chronic dyspnea on  exertion and cough which has remained unchanged for years, occasional  urinary straining and nocturia, numbness in his bilateral upper extremities  in the morning which he also feels to be chronic, arthralgias in both feet  and right knee.  All other points are negative.   PHYSICAL EXAMINATION:  GENERAL:  Well-nourished, well-developed, pleasant  white male who is still complaining of 1 over 10 chest discomfort.  VITAL SIGNS:  Blood pressure is 105/66, pulse 61, respirations 20, 98% sat  on room air.  HEENT:  Unremarkable, except for dentures.  NECK:  Supple without thyromegaly, adenopathy, JVD or carotid bruits.  CHEST:  Symmetrical excursion.  LUNGS:  Sounds clear to all auscultation but somewhat diminished.  HEART:  Regular rate and rhythm.  Normal S1-S2.  He does have a split S2  that is physiologic without murmurs, rubs, clicks or gallops.  All pulses  are symmetrical and intact without femoral or abdominal bruits.  SKIN:  Integument was intact.  He does have some bruising, particularly in  the left lower abdominal quadrant.  ABDOMEN:  Slightly rounded.  Bowel sounds present without organomegaly,  masses or tenderness.  EXTREMITIES:  Negative cyanosis, clubbing or edema.  MUSCULOSKELETAL:  Essentially unremarkable with full range of motion of his  upper extremities and palpation to his chest and shoulder.  He did not have  any reproducible chest discomfort.  NEUROLOGIC:  Intact.  Chest x-ray:  No  active disease.  EKG shows sinus bradycardia, normal axis,  normal intervals, early R with nonspecific ST-T changes grossly unchanged  from January 2007.  I-STAT in ER shows H&H of 15 and 44.  Sodium 137,  potassium 4.7, BUN 21, creatinine 1.1, glucose 102.   IMPRESSION:  1.  Prolonged chest discomfort, somewhat atypical.  2.  A recent adenosine Myoview __________  study:  Ejection fraction 54%,      fixed inferior wall defect, and mild reversibility in the apex, not      excluding ischemia.  3.  Residual coronary artery disease.  4.  Status post stenting to the left anterior descending artery and      circumflex with drug-eluting stents, January 2006.   History as mentioned in the past medical.   DISPOSITION:  1.  Dr. Dorethea Clan reviewed the patient's past medical history, spoke with and      examined the patient, and agrees with the above.  Given his recent      history of residual coronary artery disease, recent negative adenosine      Myoview, and multiple cardiac risk factors, despite the atypical nature      of the discomfort, Dr. Dorethea Clan has recommended cardiac catheterization to      Mr. Meas and his wife.  We were able to add him on to the      catheterization schedule for this afternoon; thus, he will be made NPO.  2.  We will start IV fluids.  3.  We will check C-MET, CBC, PT/PTT, and a hemoglobin A1c.  We have also      added a D-dimer to his blood work.  4.  Further plans will be pending the results of his blood work and the      cardiac catheterization.      Joellyn Rued, P.A. [facility]      Vida Roller, M.D.  Electronically Signed    EW/MEDQ  D:  11/02/2005  T:  11/03/2005  Job:  [id number]   cc:   Samuel Jester  Fax: [phone]   Arvilla Meres, M.D. [facility]  [facility]  [address]  [place]  [place] [address]   Duke Salvia, M.D.  [address]  [address]  [place]  [place] [address]   Charlaine Dalton. Sherene Sires, M.D. [facility]  [address]  [place]  [place]  [address]   [place], Dr.  [facility]

## 2010-12-22 NOTE — Discharge Summary (Signed)
[place]  Patient:    [name] [name], [name] [name]                       MRN: [id number] Adm. Date:  [id number] Disc. Date: 07/03/00 Attending:  Nathen May Dictator:   Chinita Pester, N.P. CC:         Nathen May, M.D., [facility]   Discharge Summary  PRIMARY DIAGNOSIS:  Chest pain.  HISTORY OF PRESENT ILLNESS:  This is a 70 year old male with a history of SVT, status post radiofrequency ablation by Dr. Graciela Husbands six to seven years ago complicated by a clot in his lung and blockages by cath at the time of the radiofrequency ablation.  He was seen in [facility] Emergency Room for complaints of chest pain.  He had progressively worsening substernal chest discomfort for two weeks.  He now has pain which is described as sharp and brought on by exertion and made better with rest.  He has noted decreased exercise tolerance over the last two weeks as well.  Last p.m. he developed a 10/10 chest pain that was lasted about 30-45 minutes.  Then again this a.m., he developed a 10/10 chest pain that seemed to last all morning.  He presented to the emergency room.  He has had associated shortness of breath, diaphoresis, and radiation down bilateral arms.  He denies any palpitations, orthopnea, or PND.  He received three nitroglycerins in the emergency room and was pain free.  PAST MEDICAL HISTORY:  Positive for anxiety, gastroesophageal reflux disease, cervical disk surgery, supraventricular tachycardia.  ALLERGIES:  No known drug allergies.  SOCIAL HISTORY:  Positive tobacco a pack a day for 40 years, occasional alcohol.  Negative drug use.  He is married.  FAMILY HISTORY:  Positive for CAD.  Brother had CABG in his 72s.  HOSPITAL COURSE:  The patient was admitted and underwent a cardiac catheterization on November 27 which showed an EF of 65%, left main ostial 40%, and LAD ostial 30%, mid 50% left circumflex proximal, 40% mid, 30% distal, 40% after  OM-2.  The patient had spasm which was relieved with nitroglycerin.  The patient was noted to have a LV function with moderate fixed CAB which appears nonobstructed, but significant coronary spasm relieved with nitroglycerin.  Postcath, he had uncomplicated postcath course.  DISCHARGE MEDICATIONS:  He was eventually discharged to home on: 1. Aspirin 325 mg q.d. 2. Imdur 30 mg q.d. 3. Norvasc 2.5 mg q.d. 4. Prilosec and Pepcid as before. 5. Wellbutrin 150 mg q.d. 6. Sublingual nitroglycerin p.r.n.  DISCHARGE INSTRUCTIONS:  He was not to do any driving for the next 48 hours or heavy lifting above 5 pounds for the next three days.  He was to follow a low-fat low-cholesterol diet.  SPECIAL DISCHARGE INSTRUCTIONS:  He was instructed to quit smoking.  DISCHARGE FOLLOWUP:  He is to follow up with physician assistant on December 11 at 10:30 a.m., and a stress test was scheduled for December 5 at 12:45 p.m. DD:  07/03/00 TD:  07/03/00 Job: [id number]

## 2010-12-22 NOTE — Assessment & Plan Note (Signed)
[facility]                               PULMONARY OFFICE NOTE   NAME:Fenstermaker, DELVON CHIPPS                       MRN:          [id number]  DATE:04/22/2006                            DOB:          May 31, 1941    I saw Mr. [name] [name] today in follow-up for his obstructive sleep apnea.  He had  undergone the auto CPAP titration study and had his pressure setting changed  recently, approximately one week ago.  He says that since the pressure  change he is using his CPAP approximately 3-4 hours a night.  He will  typically go to bed about 10:00 at night and wakes up at 5:00 in the  morning.  He states that he has noticed that he is not as tired during the  day and feels like he has more energy since using his CPAP.  He is currently  using a full face mask but is considering having that changed to nasal  cones.  He complains of having some nasal congestion, as well as some  drainage from his nose and his mouth.  Otherwise, he is not having any  difficulties using his machine.  He says he will accidentally take his  machine off without realizing it.  Of note is that he had surgery on his  right thumb approximately 3 weeks ago.  Otherwise, he has not had any other  significant change in his health or his medications since his last visit.  At this point I have encouraged him to maintain his compliance with his CPAP  machine.  I will also start him on Veramyst - 2 sprays in each nostril once  daily to see if this improves his symptoms of nasal congestion.  I have also  asked him to try and make adjustments in the settings for his humidifier to  see if this helps with his symptoms of dryness.  Otherwise, I plan on having  him follow-up in approximately 2-3 months.                                   Coralyn Helling, MD   VS/MedQ  DD:  04/22/2006  DT:  04/23/2006  Job #:  [id number]

## 2010-12-22 NOTE — Op Note (Signed)
NAME:  SAVON, BORDONARO.:  [id number]   MEDICAL RECORD NO.:  [id number]          PATIENT TYPE:  OIB   LOCATION:  5017                         FACILITY:  [facility]   PHYSICIAN:  Dionne Ano. Gramig III, M.D.DATE OF BIRTH:  [birth year]   DATE OF PROCEDURE:  04/04/2006  DATE OF DISCHARGE:                                 OPERATIVE REPORT   SURGEON:  Dionne Ano. Amanda Pea, M.D.   ASSISTANT:  Thayer Headings, M.D.   PREOPERATIVE DIAGNOSIS:  Right thumb end-stage degenerative joint disease,  with failure of conservative management.   POSTOPERATIVE DIAGNOSIS:  Right thumb end-stage degenerative joint disease,  with failure of conservative management.   PROCEDURES:  1. Right thumb carpometacarpal arthroplasty (removal of trapezium at the      base of the thumb joint), right upper extremity.  2. Abductor pollicis longus-digastric tendon transfer to the first      metacarpal flexor carpi radialis, and back upon the abductor pollicis      longus proper and itself (Zancolli tendon transfer), right base of      thumb joint.  3. Right thumb abductor pollicis longus 1/3-proper portion tendon transfer      to the flexor carpi radialis, abductor pollicis longus proper, and back      upon itself with multiple figure-of-eight throws (Weilby tendon      transfer), right base of thumb joint.  4. Abductor pollicis longus tenodesis (shortening of the wrist extensor at      the wrist-forearm level to prevent dorsolateral escape).   COMPLICATIONS:  None.   ANESTHESIA:  General.   TOURNIQUET TIME:  Less than 1 hour.   DRAINS:  None.   ESTIMATED BLOOD LOSS:  Minimal.   INDICATIONS FOR PROCEDURE:  Mr. Hyams presents with the above-mentioned  diagnosis.  I have counseled him regard the risks and benefits of surgery,  including risks of infection, bleeding, anesthesia, damage to normal  structures, and failure of surgery to accomplish the intended goals of  alleviating symptoms and  restoring function.  With this in mind, he has  asked [place] to proceed.  All Questions have been encouraged and answered  preoperatively.   OPERATIVE PROCEDURE:  The patient was seen by myself and Anesthesia, taken  to the operating suite and given preoperative antibiotics in the form of  Ancef.  He was appropriately padded, laid supine, and he underwent a general  anesthesia.  Once a sterile field was secured about the right upper  extremity, we then inflated the tourniquet to 250 mmHg.  This was done after  a thorough Betadine Scrub and Paint for 10 minutes was performed, and I then  performed a dorsoradial incision about the thumb.  Dissection was carried  down and the interval between the EPB and APL was created.  The capsule was  incised.  The superficial radial nerve and radial artery were identified and  protected.  Once this was done, I then performed excision of the trapezium  without difficulty in a piecemeal fashion.  I performed an FCR tenolysis and  tenosynovectomy, and then drilled a hole dorsal  to palmar, extending  articularly in line with the palmar oblique ligament.  This was enlarged to  a 3.2 drill bit approximately.  Once this was done, we irrigated copiously  and made sure all the trapezium was removed.  This completed the  arthroplasty/trapezium excision portion of the procedure.   Once this was done, I then performed a small incision at the distal third of  the forearm dorsoradially and dissected down, and dissected the digastric  tendon of the APL and the 1/3-proper portion of the APL proper tendon.  These were released proximally and retrieved distally through the main  wound.  Once this was done, I then performed two separate tendon transfers.  The APL-digastric portion was placed through the first metacarpal, exiting  interarticularly.  It was then looped around the FCR twice and was placed  back up through the APL proper and sutured with 4-0 FiberWire to my   satisfaction, completing the Zancolli tendon transfer portion of the  procedure.  Following this, the abductor pollicis longus 1/3 proper portion  was placed around the FCR and APL proper, and back upon themselves with  multiple figure-of-eight throws.  This was inset with 4-0 FiberWire and the  tension was excellent.  This completed the [facility] tendon transfer.  Following this, I then performed a shortening of the wrist extensor by  tightening the APL proper with 4-0 FiberWire.  A complex capsular  imbrication ensued as well.  This was a shortening of the wrist extensor at  the wrist level to prevent dorsolateral escape, and I was pleased with this.  Following this, I then performed copious irrigation with the tourniquet  deflated.  Hemostasis was obtained with bipolar electrocautery, and the  wound was then closed with Prolene.  The counterincision proximally was  closed as well with Prolene.  Once this was done, we placed a sterile  dressing, followed by a thumb spica splint on.  He tolerated the procedure  well, had soft compartments, excellent refill, and no completing features.  All sponge, needle and instrument counts were reported as correct.  The  patient will be monitored in the postoperative recovery area and will be  admitted for IV antibiotics, pain management and general postop observation.  I have gone over these issues at length with the patient, and all questions  have been encouraged and answered.           ______________________________  Dionne Ano. Everlene Other, M.D.     Nash Mantis  D:  04/04/2006  T:  04/04/2006  Job:  [id number]

## 2010-12-22 NOTE — Discharge Summary (Signed)
NAME:  JADIN, CREQUE NO.:  [id number]   MEDICAL RECORD NO.:  [id number]          PATIENT TYPE:  INP   LOCATION:  6526                         FACILITY:  [facility]   PHYSICIAN:  Theodore Demark, P.A. LHCDATE OF BIRTH:  [birth year]   DATE OF ADMISSION:  08/08/2004  DATE OF DISCHARGE:  08/09/2004                                 DISCHARGE SUMMARY   PROCEDURES:  None.   HOSPITAL COURSE:  Mr. Sanfilippo is a 70 year old male with known moderate  coronary artery disease.  He had an MI secondary to coronary artery spasms  in 2001.  Since then, he has done very well.  When he quit smoking he was  able to discontinue the Norvasc and the Imdur, however, when he started  smoking again he had to restart the Imdur.  Since then, he has had  occasional vasospastic chest pains that he describes as a buzzing pain in  his lower left chest.  He says it is always relieved by one sublingual  nitroglycerin which he rarely has to take.   On the day of admission, he became angry and was walking away when he began  experiencing sharp chest pain.  He treated himself with sublingual  nitroglycerin x2.  The pain was a 7/10 at first and the nitroglycerin did  not help.  He called 911 and took one more nitroglycerin.  He walked around  without any change and there was no change in his pain with position  changes, deep inspiration, or cough.  He states his chest wall was not  tender.  The pain was associated with shortness of breath and diaphoresis  but no nausea and vomiting.  He was given baby aspirin x4 by EMS.  In the  emergency room, he was given IV nitroglycerin.  His pain decreased to a  1/10.  The last time he had symptoms similar to these was in 2001 prior to  his catheterization.  He was admitted for further evaluation and treatment.   His EKG was without ischemic changes and his cardiac enzymes were negative.  He was evaluated by Dr. Gerri Spore, who felt that an outpatient evaluation  with  Cardiolite was appropriate.  This was scheduled for August 09, 2004 at  11:45 a.m.   Mr. Vanbenschoten was considered stable for discharge on 08/09/04 with outpatient  followup arranged.  Of note, a lipid profile was drawn and is pending at the  time of dictation.   DISCHARGE DIAGNOSES:  1.  Chest pain, cardiac enzymes negative for myocardial infarction,      Cardiolite today.  2.  Status post cardiac catheterization in 2001 with 40% left main LAD with      a 30% and a 50% stenosis, circumflex with 40% and 30% stenosis, and RCA      30%.  Vasospasm was noted during the catheterization and relieved with      intracoronary nitroglycerin.  3.  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  4.  History of supraventricular tachycardia status post ablation,      complicated by pulmonary embolus in 1995.  5.  History of Cardiolite in 2001 that was negative for scar or ischemia,      showed an EF of 58%.  6.  History of hyperlipidemia.  7.  Family history of coronary artery disease.  8.  Ongoing tobacco use.  9.  Gastroesophageal reflux disease.  10. Anxiety.  11. Osteoarthritis status post steroid injections in both thumbs.  12. Status post C-spine surgery.   DISCHARGE INSTRUCTIONS:  His activity level is to be as tolerated after the  stress test.  He is to stick to a low fat diet.  He is to get a stress test  today at 11:45 a.m. in our office.  He is to follow up with Dr. Gala Romney on  August 22, 2004 at 3:15 p.m.  He is not to use tobacco.   DISCHARGE MEDICATIONS:  1.  Mobic 15 mg daily.  2.  Imdur 30 mg 1/2 tablet daily.  3.  Prozac 20 mg daily.  4.  Pepcid complete daily.  5.  Vitamin C multivitamin, lysine, and fish oil as prior to admission.  6.  Aspirin 325 mg daily.       RB/MEDQ  D:  08/09/2004  T:  08/09/2004  Job:  [id number]   cc:   [address]  [place]  [place] [address]  Fax: [phone]   Arvilla Meres, M.D. [facility]

## 2010-12-22 NOTE — Discharge Summary (Signed)
NAME:  [name] [name], [name] [name].:  [id number]   MEDICAL RECORD NO.:  [id number]          PATIENT TYPE:  INP   LOCATION:  3709                         FACILITY:  [facility]   PHYSICIAN:  Vida Roller, M.D.   DATE OF BIRTH:  11/18/1940   DATE OF ADMISSION:  11/02/2005  DATE OF DISCHARGE:  11/03/2005                                 DISCHARGE SUMMARY   PRIMARY CARE PHYSICIAN:  Samuel Jester, M.D. in [place].   PULMONOLOGIST:  Charlaine Dalton. Sherene Sires, M.D.   ORTHOPEDIC:  Dionne Ano. Amanda Pea, M.D.   CARDIOLOGIST:  Arvilla Meres, M.D./Steven Anabel Halon, M.D.   PROCEDURE:  Left heart catheterization/SCA/IVUS of the LAD on November 02, 2005.   ALLERGIES:  The patient has no known drug allergies.   DISCHARGE DIAGNOSIS:  Chest pain, noncardiac.   SECONDARY DIAGNOSES:  1.  Supraventricular tachycardia, status post ablation in 1995 which was      complicated by pulmonary embolism.  2.  Chronic obstructive pulmonary disease, status post bronchoscopy.  3.  Hyperlipidemia.  4.  Osteoarthritis.  5.  Hypertension.  6.  Glucose intolerance.  7.  Anxiety.   HISTORY OF PRESENT ILLNESS:  This 70 year old male who was admitted on November 02, 2005 after he had some chest pain while at work. The patient had some  pleuritic-type chest pain that felt different from his previous pain. The  patient was admitted to [facility] and a planned cardiac  catheterization was performed on November 02, 2005 by Dr. Charlies Constable, which  showed no ruptured plaque, nonobstructive CAD and a 40-50% stenosis of the  LAD. The patient was seen on November 03, 2005 by Dr. Vida Roller who stated  that the patient was stable to be discharged home.   The patient will be discharged home on the following medications:  Lipitor  40 mg q.h.s., aspirin 81 mg daily, Mobic 15 mg daily, Plavix 75 mg daily,  Prilosec over-the-counter as needed, Prozac as previously ordered and  Niaspan 1000 mg q.h.s. The patient will  follow up with Dr. Arvilla Meres  in two weeks. The office will call for an  appointment. He was discharged on heart healthy diet. He was also given a  wound care sheet that stated that if the patient had any fever, swelling or  discharge at the site to call the office. He was instructed not to drive for  two weeks, no lifting for one week. Total time with N.P. and M.D. time was  less than 30 minutes.     ______________________________  April Humphrey, NP      Vida Roller, M.D.  Electronically Signed    AH/MEDQ  D:  12/04/2005  T:  12/04/2005  Job:  [id number]

## 2010-12-22 NOTE — Procedures (Signed)
NAME:  [name] [name], [name] [name] NO.:  [id number]   MEDICAL RECORD NO.:  [id number]          PATIENT TYPE:  OUT   LOCATION:  SLEEP CENTER                 FACILITY:  [facility]   PHYSICIAN:  Coralyn Helling, M.D.      DATE OF BIRTH:  1941/07/28   DATE OF STUDY:  02/21/2006                              NOCTURNAL POLYSOMNOGRAM   REFERRING PHYSICIAN:  Coralyn Helling, M.D.   INDICATION FOR THE STUDY:  This is an individual who has coronary artery  disease, snoring, difficulty with his breathing at night and excessive  daytime sleepiness who presents to the sleep lab for evaluation of sleep  apnea.   EPWORTH SCORE:  13.   MEDICATIONS:  Vitamin C, Plavix, aspirin, Lipitor, Niaspan, Mobic, Protonix.  Of note that the patient removed his dentures prior to undergoing the sleep  study.   SLEEP ARCHITECTURE:  This was a split night protocol.  Total recording time  was 406.5 minutes of which 197.1 minutes were used for the diagnostic  portion and the remainder was used for the therapeutic portion of the study.  Total sleep time was 313.5 minutes.  Sleep efficiency was 77%.  Sleep  latency was 18.5 minutes, which is normal.  REM latency is 109.5 minutes,  which is normal.  The study was notable for the lack of slow wave sleep.  The patient was observed in both the supine and nonsupine position.   RESPIRATORY DATA:  The average respiratory rate was 14.  DURING the  diagnostic portion of the study the apnea/hypopnea index was noted to be  59.3.  Loud snoring was noted by the technician.  There also appeared to be  a positional as well as supine effect for his apneic events and the events  were exclusively obstructive in nature.  During the therapeutic portion he  was titrated from a pressure setting of 6 to 22 cmH2O.  At the pressure  setting of 21 cmH2O his apnea/hypopnea index was reduced to 9.1.  There was  one central event noted at this pressure setting and he was observed in both  REM  sleep and supine sleep.  However, he only had approximately 14 minutes  of sleep time at this pressure setting.  Of note is that at lower pressure  settings he continued to have snoring and apneic events.   OXYGEN DATA:  His baseline oxygenation was 96%.  His oxygen saturation nadir  during the diagnostic portion of the study was 79%.  During the therapeutic  portion at a CPAP pressure setting of 21, his oxygen saturation nadir was  90% and his mean oxygen saturation was 97%.   CARDIAC DATA:  Showed normal sinus rhythm.  The average heart rate was 56.   MOVEMENT/PARASOMNIA:  The periodic limb movement index is 35.2 which is  increased.   IMPRESSION:  This was a split night study.  During the diagnostic portion  study it was noted that his apnea/hypopnea index is 59.3 and oxygen  saturation nadir was 79%, consistent with severe obstructive sleep apnea.  Of note is that he had a significant positional as well as REM effect.  During  the therapeutic portion of the study at a CPAP pressure setting of 21  cmH2O, his apnea/hypopnea index was reduced to 9.1, snoring was eliminated,  and he was observed in both REM sleep and supine sleep at this pressure  setting.  However, he only had approximately 14 minutes of sleep time at  this pressure setting.  Additionally, his periodic limb movement index was  noted to be increased at 35.  However, clinical correlation would be  necessary to determine the significance of this.  At this time the patient  should be initiated on CPAP pressure using 21 cmH2O with heated  humidification and followed up clinically for his ability to tolerate this.  If he is not able to tolerate this, consideration should be given to having  the patient return to the sleep lab for a possible BiPAP titration study.  Alternatively, he could undergo an auto BiPAP titration study as an  outpatient.      Coralyn Helling, M.D.  Diplomat, Biomedical engineer of Sleep Medicine   Electronically Signed     VS/MEDQ  D:  02/28/2006 15:19:54  T:  02/28/2006 16:26:42  Job:  [id number]

## 2010-12-22 NOTE — Op Note (Signed)
NAME:  [name] [name], [name] [name] NO.:  [id number]   MEDICAL RECORD NO.:  [id number]          PATIENT TYPE:  AMB   LOCATION:  CARD                         FACILITY:  [facility]   PHYSICIAN:  Casimiro Needle B. Sherene Sires, M.D. [place] OF BIRTH:  1940/10/29   DATE OF PROCEDURE:  06/14/2005  DATE OF DISCHARGE:                                 OPERATIVE REPORT   REFERRED BY:  Dr. Samuel Jester.   HISTORY AND INDICATIONS:  Please see dictated office notes. This patient has  a history of smoking with recurrent left sided pneumonias and persistent  rhonchi on exam suggestive of possible endobronchial lesion in the left  lower lobe. I recommended bronchoscopy to directly visualize the left lower  lobe and make sure there was not a bronchial lesion causing these problems.   The patient agreed to the patient in the office but after a full discussion  of  the risks, benefits, and alternatives. He was given a total of 25 mg of  IV Demerol and 2.5 mg of IV Versed while being continuously monitored by  surface ECG and oximetry. The right and left nares were prepared with 1%  lidocaine by updraft nebulizer and 2% lidocaine jelly.   Using a standard flexible video bronchoscope, the right naris was easily  cannulated with good visualization of the entire oropharynx and larynx. The  cords moved normally and there were no apparent upper airway lesions.   Using an additional 1% lidocaine as needed, the entire tracheobronchial tree  was explored bilaterally with the following findings.   1.  The trachea and carina were normal.  2.  The right sided airways were normal.  3.  There was a marked narrowing at the takeoff of the left main stem      bronchus which limited the diameter of the left main stem bronchus to      approximately 50% of its normal diameter. Within 4 cm, the left main      stem bronchus then opened up to a completely normal airway. All the      airways beyond the proximal left main stem  bronchus were normal. There      were no focal endobronchial lesions nor were then any increased      secretions.   The left lower lobe was lavaged to be complete for AFB and fungal stain and  culture, routine stain and culture and cytology.   IMPRESSION:  Anatomic variant versus effective previous thoracic surgery on  the left main stem bronchus leaving reduction in the diameter of this  airway. It is absolutely critical in this setting that the patient stop  smoking. However, I see no evidence of endobronchial disease and will  attempt to correlate this with CT scan findings when he returns to the  office for followup.   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           ______________________________  Charlaine Dalton Sherene Sires, M.D. [facility]     MBW/MEDQ  D:  06/14/2005  T:  06/14/2005  Job:  [id number]

## 2010-12-22 NOTE — H&P (Signed)
NAME:  [name] [name], PENADO.:  [id number]   MEDICAL RECORD NO.:  [id number]          PATIENT TYPE:  EMS   LOCATION:  MAJO                         FACILITY:  [facility]   PHYSICIAN:  Vida Roller, M.D.   DATE OF BIRTH:  [birth year]   DATE OF ADMISSION:  08/30/2004  DATE OF DISCHARGE:                                HISTORY & PHYSICAL   PHYSICIANS:  Primary care physician:  Samuel Jester, M.D.  Primary cardiologist: Carole Binning, M.D.   HISTORY OF PRESENT ILLNESS:  [name] [name] is a 70 year old man with a history  of moderate coronary artery disease which was felt to be nonobstructive,  treated with medical therapy, who also has a history of vasospasm, who  presented to the emergency room today after about a 45-minute episode of  discomfort in his chest.  He was lifting some heavy objects at work and had  a sudden onset associated with some shortness of breath, very characteristic  for his previous angina, mild diaphoresis.  He took a sublingual  nitroglycerin.  He got significantly better.  He called 911.  They gave him  some aspirin and some nasal cannula O2, and it resolved, and he is here in  the emergency department currently pain free.  His most recent evaluation  was a 24-hour admission here at [facility] on January 3 through 4 of this  year where he had a similar episode of discomfort in his chest.  He was  admitted, ruled out for myocardial infarction, and the plan was for him to  get a heart catheterization done on Friday in the outpatient laboratory by  Dr. Juanda Chance.  He denies any PND or orthopnea, no lower extremity edema.  He  recently quit smoking, although he was smoking at the time of his most  recent admission.   PAST MEDICAL HISTORY:  1.  Coronary artery disease.  He had a catheterization in 2001 which showed      a 40% left main, 50% mid LAD, a 40% circumflex, and a 30% mid RCA.  At      that time, he had normal LV function.  There was some problem  with      vasospasm at that time, and he was treated with intracoronary      nitroglycerin and did reasonably well.  2.  History of supraventricular tachycardia  status post ablation.  3.  History of pulmonary embolus which complicated that ablation in 1995.  4.  History of Cardiolite done in 2001 which shows no ischemia and an      ejection fraction of 58%.  5.  Hypertension.  6.  Hyperlipidemia.  7.  Family history of coronary disease.  8.  Ongoing tobacco abuse.  He quit earlier this month.  9.  Gastroesophageal reflux disease.  10. Anxiety.  11. On previous examination, he had a lung nodule that was seen, and this is      currently being worked up by the pulmonary folks.   PAST SURGICAL HISTORY:  1.  C spine surgery.  2.  He has had some injections  in his thumb.   ALLERGIES:  No known drug allergies.   CURRENT MEDICATIONS:  1.  Mobic 15 mg once a day.  2.  Imdur 15 mg once a day.  3.  Prozac 20 mg once a day.  4.  Aspirin 325 mg once a day.  5.  Multivitamins.  6.  Fish oil.  7.  Pepcid complete once a day.   SOCIAL HISTORY:  He lives in [place] with his wife.  He works in the  trucking business.  He has about a 45+-pack-year smoking history.  He denies  alcohol or drug abuse.   FAMILY HISTORY:  His mother died at age 69; his father died at age 57, both  of them at advanced age.  His father had some Alzheimer's disease.  He has a  brother who is alive and well, had some coronary disease and had to have a  bypass surgery at a relatively early age.   REVIEW OF SYSTEMS:  Essentially negative except for that described in the  History of Present Illness.   PHYSICAL EXAMINATION:  GENERAL:  Well-developed, well-nourished white male  in no apparent distress.  Alert and oriented x 4 and an excellent historian.  VITAL SIGNS: Blood pressure 120/80, pulse 65, respirations 14, and he is  afebrile and saturating 100% on 2 liters nasal cannula.  HEENT:  Normocephalic and  atraumatic with equal pupils.  NECK:  Supple.  He has no jugular venous distention or carotid bruits.  CHEST: Clear to auscultation bilaterally with the exception of some wheezes  at the periphery.  CARDIOVASCULAR:  Regular rate with 2/6 systolic murmur.  His first and  second heart sounds are normal.  Point of maximal impulse is not displaced.  ABDOMEN:  Soft, nontender.  EXTREMITIES:  Without clubbing, cyanosis, or edema.  NEUROLOGIC:  Exam is nonfocal.   LABORATORY DATA AND OTHER STUDIES:  Laboratories are still pending, but his  laboratories from 4 January appear to be reasonably normal.  His first set  of point-of-care enzymes is negative.   Electrocardiogram currently is sinus rhythm at a rate of 55 with normal  intervals and normal axes.  He has no ischemic ST-T wave changes and no Q  waves concerning for old myocardial infarction.  It is essentially unchanged  from the EKG we have from 3 January of this year.   ASSESSMENT:  This is a gentleman with known coronary artery disease and  chest discomfort.  He has a history of vasospasm and also has a history of  supraventricular tachycardia, history of pulmonary embolus, and recent  tobacco abuse, although he quit this month, hypertension, and  hyperlipidemia.   PLAN:  1.  Admit him.  2.  Cycle his enzymes.  3.  Will treat him with lovenox.  4.  The plan is for heart catheterization in the morning.      JH/MEDQ  D:  08/30/2004  T:  08/30/2004  Job:  [id number]   cc:   Samuel Jester, M.D.  [place], [place]   Carole Binning, M.D. [facility]

## 2010-12-22 NOTE — Op Note (Signed)
NAME:  [name] [name], [name] [name] NO.:  [id number]   MEDICAL RECORD NO.:  [id number]          PATIENT TYPE:  AMB   LOCATION:  SDS                          FACILITY:  [facility]   PHYSICIAN:  Dionne Ano. 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   PROCEDURE:  1. Left thumb CMC arthroplasty (removal of the trapezium at the base      of the thumb joint region secondary to long-standing arthritis left      thumb).  2. Abductor pollicis longus digastric portion tendon transfer to the      first metacarpal FCR and back upon the APL proper and itself      (Zancolli tendon transfer) left base of thumb joint.  3. Abductor pollicis longus 1/3 proper portion tendon transfer to the      FCR, APL proper, and back upon itself with multiple figure-of-eight      throws (Weilby tendon transfer) left base of thumb joint.  4. Abductor pollicis longus tenodesis (shortening of a wrist extensor      at the wrist level to prevent dorsolateral escape).   SURGEON:  Dionne Ano. Amanda Pea, M.D.   ASSISTANT:  Karie Chimera, P.A.-C.   COMPLICATIONS:  None.   ANESTHESIA:  Axillary block with IV sedation.   TOURNIQUET TIME:  Less than 90 minutes.   ESTIMATED BLOOD LOSS:  Minimal.   INDICATIONS FOR PROCEDURE:  [name] [name] is a 70 year old male who has  undergone right thumb joint reconstruction in the past.  He presents for  left thumb reconstruction, understanding and accepting the risks and  benefits of surgery including the risks of infection, bleeding,  anesthesia, damage to normal structures, and failure of surgery to  accomplish its intended goals of relieving symptoms and restoring  function.  With this in mind, he desires to proceed.  All questions have  been encouraged and answered.   OPERATIVE PROCEDURE:  He was taken to the operative suite. Prior to  being taken to the operative arena, he was marked, consented, and  underwent a block by Dr. Jairo Ben. Preoperative antibiotics were  given in the operative suite.  He was sedated and the left upper  extremity was stress tested and then prepped and draped in the usual  sterile fashion with Betadine scrub and paint.  Following this, the  patient underwent insufflation of the tourniquet and under sterile  field, a curvilinear incision was made about the dorsal aspect of the  thumb CMC region.  Dissection was carried down and the EPB and APL  tendon were identified.  I split these longitudinally. The EPB had a  very aberrant pathway and thus I performed a tenotomy of the EPB to  prevent MP hyperextension and  chronic problems.  I entered the interval  between the two, opened the capsule, and then identified the radial  artery and kept this out of harm's way.  The superficial radial nerve  was kept out of harm's way.  These structures were identified and  protected.  Following this, with a combination osteotome, curet, and  rongeur, I performed trapezium excision at the base of the thumb region.  The patient had significantly arthritic joints.  Following this, I  performed a tenolysis and tenosynovectomy of the FCR (flexor carpi  radialis) tendon.  Next, I created a drill hole dorsal to palmar exiting  intra-articularly in line with the palmar beak ligament.  I then  irrigated the area copiously and this completed the arthroplasty portion  of the procedure.   Following this, I then performed a counter incision at the dorsal radial  third of the forearm and dissected out and harvested the digastric  portion of the APL and the 1/3 proper portion of  the APL tendon.  These  tendons were then retrieved in the main incision.  During this harvest,  I kept the superficial radial nerve out of harm's way.  Following this,  I then performed a tendon transfer.  This was done by weaving the APL  digastric portion through the drill hole dorsal to palmar exiting intra-  articularly. I wrapped this around the FCR and then back upon itself in  the APL proper and inset this with 4-0 and 3-0 FiberWire combination.  This completed the Zancolli tendon transfer.   Following this, I then performed APL 1/3 proper portion tendon transfer  to the FCR, APL proper, and back upon itself with multiple figure-of-  eight throws, and then back upon themselves with multiple figure-of-  eight throws.  The second tendon transfer was inset with 4-0 and 3-0  FiberWire.  This completed the [facility] tendon transfer.   I should note performed a tenotomy of the EPB tendon and left this freed  to prevent MCP hyperextension.  I also released the dorsal edge of the  first dorsal compartment during the course of the operative procedure  very carefully.   Following this, I performed APL tenodesis (shortening of the wrist  extensor at the wrist level to prevent dorsolateral escape of the  arthroplasty).  This was done with 4-0 and 3-0 FiberWire to my  satisfaction without difficulty.   Following this, the tourniquet was deflated.  Hemostasis was obtained  with electrocautery. Bone wax was placed over the drill hole and the  patient had excellent refill.  There were no complicating features.  He  had some general ooze from the Plavix that he is on chronically but,  overall, had excellent hemostasis at the conclusion of the case.  Gelfoam was placed in the defect created by the arthroplasty and the  patient had the wound closed with Prolene suture.  I was pleased with  the findings.  He will be admitted for IV antibiotics, observation, and general postop  measures.  He will  notify me, of course, should any problems questions  or concerns arise.  I will monitor his condition closely and proceed  accordingly with postop management according to our protocol.  At the  time of the first postop visit, we will plan for suture removal and cast  application.  He will be casted for six weeks total from the day of  surgery.           ______________________________  Dionne Ano. Gramig  III, M.D.     Nash Mantis  D:  10/10/2006  T:  10/10/2006  Job:  [id number]

## 2010-12-22 NOTE — Assessment & Plan Note (Signed)
[facility]                               PULMONARY OFFICE NOTE   NAME:Stenerson, ASHDEN SONNENBERG                       MRN:          [id number]  DATE:03/19/2006                            DOB:          [birth year]    I saw Mr. Throne today in follow up after he had undergone his split night  polysomnogram on February 21, 2006.  During the diagnostic portion of the study  he was found to have severe obstructive sleep apnea as demonstrated by an  apnea/hypopnea index of 59.3 and an oxygen saturation nadir of 79%.  During  the therapeutic portion of the study he had difficulty with tolerating the  CPAP mask and as a result had frequent sleep disruption.  He was eventually  titrated to a CPAP pressure setting of 21 cmH20 and his apnea/hypopnea index  reduced to 9.  He was observed in REM sleep and supine sleep at this  pressure setting.  However, he had only 14 minutes of sleep at this pressure  setting.  Additionally of note is that he had the increase in his periodic  limb movement index.  At this time I reviewed the results of his sleep study  with him and I discussed several treatment options with him with regards to  treatment of his obstructive sleep apnea including CPAP therapy, oral  appliance and surgical intervention.  He was initially reluctant to consider  using CPAP therapy because of concerns with claustrophobia and difficulties  he had with the therapeutic portion of the split night study.  I discussed  with him that the other alternatives would not be as beneficial likely,  particularly with regards to oral appliance; since he has dentures, it may  be difficult to have him fitted for this.  With regards to surgical  intervention, it is a possibility that the surgical procedure could not work  and given his underlying health problems this would pose additional risk  with regards to having surgical intervention.  After much discussion, he was  somewhat  reluctantly agreeable to try CPAP therapy, particularly in light of  the fact that he does have significant coronary artery disease.  At this  time, since he had difficulty with the therapeutic portion of his split  night study, what I would try to do first is have him undergo an auto CPAP  titration study with a range of 5-20 cmH20 with humidification and a mask of  choice, and then depending upon review of this as well as his tolerance to  CPAP therapy further recommendations will be made and then I will be  planning to follow up with him in approximately 1 month to determine his  tolerance and compliance to CPAP therapy.                                   Coralyn Helling, MD   VS/MedQ  DD:  03/19/2006  DT:  03/19/2006  Job #:  [id number]

## 2010-12-22 NOTE — Assessment & Plan Note (Signed)
[facility]                              CARDIOLOGY OFFICE NOTE   NAME:[name], [name] BUCKLE                       MRN:          [id number]  DATE:04/03/2006                            DOB:          12/20/1940    PRIMARY CARE PHYSICIAN:  Dr. Samuel Jester.   PATIENT IDENTIFICATION:  [name] [name] is a very pleasant 70 year old male who  returns today for routine followup.   PROBLEM LIST:  1. Coronary artery disease.      a.     Status post 2-vessel angioplasty with Taxus drug-eluting stents       of the LAD and left circumflex in January 2006.      b.     Re-look catheterization March of 2007.  Ejection fraction 60%,       patent stents.  2. History of SVT, status post ablation 1999, complicated by pulmonary      embolus.  3. Chronic obstructive pulmonary disease.      a.     Stopped smoking October 2006.  4. Hyperlipidemia.      a.     Most recent LDL was 49 in February 2007.  5. History of symptomatic bradycardia with relief after stopping beta      blocker in May 2006.  6. Glucose intolerance.  7. Osteoarthritis.  8. Obstructive sleep apnea, recently started CPAP.   CURRENT MEDICATIONS:  1. Multivitamin.  2. Prilosec 20 mg.  3. Plavix 75 mg a day.  4. Fish oil.  5. Lexapro 20 mg.  6. Lipitor 40 mg.  7. Aspirin 81 mg.  8. Mobic 15 mg.  9. Niaspan 1000 mg.   ALLERGIES:  No known drug allergies.   INTERVAL HISTORY:  [name] [name] returns today for routine followup.  He is  doing quite well.  He is walking 2 miles a day without any chest pain or  shortness of breath.  He does have some lower extremity pain, primarily in  the front of his legs, but also some pain in the calves.  He has no  nighttime pain.  Recently underwent a sleep study, which he failed, and he  was set up with a CPAP.  He feels that he is resting better.   PHYSICAL EXAMINATION:  GENERAL:  He is well-appearing, in no acute distress.  He ambulates around the clinic without  any difficulty.  Respirations are  unlabored.  VITAL SIGNS:  Blood pressure is 113/76, with a heart rate of 79.  His weight  is 172.  HEENT:  Sclerae are anicteric, EOMI.  Scattered xanthelasma.  Mucous  membranes are moist.  NECK:  Supple.  There is no JVD.  Carotids 2+ bilaterally without any  bruits.  There is no lymphadenopathy or thyromegaly.  CARDIAC:  He is bradycardic and regular, no murmurs, rubs or gallops.  LUNGS:  Clear.  ABDOMEN:  Soft, nontender, nondistended.  No hepatosplenomegaly, no bruits,  no masses, good bowel sounds.  EXTREMITIES:  Warm with no cyanosis, clubbing or edema.  DP pulses are 2+  bilaterally.  NEUROLOGIC:  Alert and oriented x3  with a flat affect.  Cranial nerves II-  XII are intact.  He moves all 4 extremities without difficulty.   EKG sinus bradycardia, rate of 54, no ST-T wave abnormalities.   ASSESSMENT AND PLAN:  1. Coronary artery disease.  This is quite stable.  He is on a good      medical regimen with the exception of a beta blocker, which he is      unable to tolerate due to bradycardia.  2. Hyperlipidemia.  Continue current regimen, check lipids and a liver      panel today.  3. Sinus bradycardia.  This is asymptomatic.  No indication for a      pacemaker at this time.  4. Hypertension.  Well controlled.  5. Lower extremity pain.  His pulses are not consistent with peripheral      arterial disease, but we will get ultrasound and ankle brachial indices      just to confirm, given his long history of tobacco use.   DISPOSITION:  __________                                Bevelyn Buckles. Bensimhon, MD    DRB/MedQ  DD:  04/03/2006  DT:  04/04/2006  Job #:  [id number]   cc:   Samuel Jester

## 2010-12-22 NOTE — Cardiovascular Report (Signed)
NAME:  [name] [name], [name] [name].:  [id number]   MEDICAL RECORD NO.:  [id number]          PATIENT TYPE:  INP   LOCATION:  2870                         FACILITY:  [facility]   PHYSICIAN:  Charlton Haws, M.D.     DATE OF BIRTH:  1941-04-15   DATE OF PROCEDURE:  08/31/2004  DATE OF DISCHARGE:                              CARDIAC CATHETERIZATION   PROCEDURE:  Coronary angiography.   INDICATIONS FOR PROCEDURE:  Mr. Royden Purl is a 70 year old patient of Dr.  Gerri Spore and Dr. Samuel Jester.  He has a history of previous MI with vaso-  spastic angina.  Cath in 2001, showed 40% left main disease, 50% LAD  disease, 40% circ disease, and 30% right disease.  He was admitted with  recurrent chest pain in the face of ongoing tobacco abuse.   Catheterization central and right femoral artery.   RESULTS:  1.  Left main coronary artery had 30 to 40% tubular disease.  2.  Left anterior descending artery had a very tight proximal 80 to 90%      lesion.  The mid LAD had 30% multiple lesion.  3.  The distal LAD was diffusely small.  First diagonal branch had 30%      multiple lesions.  4.  The circumflex coronary artery had somewhat more diffuse and tubular 80%      calcified lesion.  There was primarily one obtuse marginal branch      without significant disease and a very small AV circumflex.  5.  The right coronary artery had 30 to 40% multiple lesions in the proximal      and mid vessel.  It was dominant.  6.  RAO ventriculography:  Essentially normal with an ejection fraction of      55%.  There was no gradient across the aortic valve and no mitral      regurgitation.  Aortic pressure was 110/70, LV pressure was 110/10.   The patient tolerated the procedure well.   IMPRESSION:  The films were reviewed with Dr. Samule Ohm.  The patient will  return tomorrow with Dr. Gerri Spore to have two-vessel angioplasty via left  anterior descending and circumflex.  He is only 70 years old and has no  significant disease of his right coronary artery.       ___________________________________________  Charlton Haws, M.D.    PN/MEDQ  D:  08/31/2004  T:  08/31/2004  Job:  [id number]   cc:   Carole Binning, M.D. [facility] [address]  [place]  [place] [address]  Fax: [phone]   Vida Roller, M.D.  Fax: [phone]

## 2010-12-22 NOTE — Op Note (Signed)
[facility]  Patient:    [name] [name], [name] [name]                       MRN: [id number] Proc. Date: 06/05/95 Adm. Date:  [id number] Attending:  Molpus, John L Dictator:   Izell Gregg. Elesa Hacker, M.D.                           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  OPERATION:  Anterior cervical diskectomy and fusion at C4-5 and C5-6 using iliac bone graft from the patient.  SURGEON:  Izell . Elesa Hacker, M.D.  ASSISTANT:  Bradd Canary., M.D.  ANESTHESIA:  General endotracheal  DESCRIPTION OF PROCEDURE:  Under general endotracheal anesthesia, this man was positioned supine on the operating room table with his head in halter traction.  The iliac crest area was rotated upward with padding.  The patient was prepped and draped in the usual manner.  Preoperative x-rays were taken to align the incision in such a way as to allow access to C4-5 and C5-6.  On the right side of the neck, an incision was made in the anterior border of the sternocleidomastoid to the midline.  Dissection was carried through platysma and down to the anterior vertebral border.  Carotid sheath contents were retracted laterally, and esophagus and trachea were retracted medially.  The longus colli muscles were identified, and separated from the vertebral surface using an insulated #4 for hemostasis and dissection.  A self-retaining retractor was put into place.  A repeat x-ray was taken to confirm the proper operative level.  Starting at C5-6, the soft disc material was removed.  The intervertebral disc space was then cleaned of additional contents using a combination of the curved Midas Rex drill, handpiece and various curets and rongeurs.  Dissection was carried down and dura was visualized.  As osteophytes and herniated disc material  were removed, the dura bulged into the available space in the anterior intervertebral space.  This area was packed with Gelfoam, soaked in thrombin.  Next, retractors were removed cephalad, and a similar procedure was carried out at C4-5 with similar findings, although those at C5-6 were more striking.  Soft disc herniation was found at both levels.  Next, after appropriate measurements were made, a 2-1/2-inch incision was made over the mid portion of the iliac crest on the right.  Dissection was carried down to that structure.  7 mm bone grafts were taken side by side, one for each of the two levels to be fused.  Hemostasis was obtained in this area, and the wound was closed.  After proper measurements and trimming, the bone plugs were put into place using the impactor and hammer.  After this, a lateral cervical spine x-ray was taken to ensure proper placement of the bone plugs.  The wound was then copiously irrigated, and hemostasis was obtained.  The wound was then closed in layers ,and Steri-Strips applied after a subcuticular closure.  A [place] collar was put into place, and the patient was then taken to recovery room. DD:  06/05/95 TD:  06/06/95 Job: [id number]

## 2010-12-25 ENCOUNTER — Observation Stay (HOSPITAL_COMMUNITY)
Admission: EM | Admit: 2010-12-25 | Discharge: 2010-12-26 | Disposition: A | Discharge status: Home / Self Care | Payer: Medicare Other | Attending: Internal Medicine | Admitting: Internal Medicine

## 2010-12-25 ENCOUNTER — Emergency Department (HOSPITAL_COMMUNITY): Payer: Medicare Other

## 2010-12-25 DIAGNOSIS — Z79899 Other long term (current) drug therapy: Secondary | ICD-10-CM | POA: Insufficient documentation

## 2010-12-25 DIAGNOSIS — R0602 Shortness of breath: Secondary | ICD-10-CM | POA: Insufficient documentation

## 2010-12-25 DIAGNOSIS — D649 Anemia, unspecified: Secondary | ICD-10-CM | POA: Insufficient documentation

## 2010-12-25 DIAGNOSIS — R0789 Other chest pain: Principal | ICD-10-CM | POA: Insufficient documentation

## 2010-12-25 DIAGNOSIS — I2510 Atherosclerotic heart disease of native coronary artery without angina pectoris: Secondary | ICD-10-CM | POA: Insufficient documentation

## 2010-12-25 DIAGNOSIS — E785 Hyperlipidemia, unspecified: Secondary | ICD-10-CM | POA: Insufficient documentation

## 2010-12-25 DIAGNOSIS — R791 Abnormal coagulation profile: Secondary | ICD-10-CM | POA: Insufficient documentation

## 2010-12-25 DIAGNOSIS — Z9861 Coronary angioplasty status: Secondary | ICD-10-CM | POA: Insufficient documentation

## 2010-12-25 DIAGNOSIS — K219 Gastro-esophageal reflux disease without esophagitis: Secondary | ICD-10-CM | POA: Insufficient documentation

## 2010-12-25 DIAGNOSIS — G4733 Obstructive sleep apnea (adult) (pediatric): Secondary | ICD-10-CM | POA: Insufficient documentation

## 2010-12-25 LAB — POCT CARDIAC MARKERS
CKMB, poc: <1 ng/mL — ABNORMAL LOW (ref 1.0–8.0)
Myoglobin, poc: 35 ng/mL (ref 12–200)
Troponin i, poc: <0.05 ng/mL (ref 0.00–0.09)

## 2010-12-25 LAB — BASIC METABOLIC PANEL
CO2: 27 mEq/L (ref 19–32)
Calcium: 9.6 mg/dL (ref 8.4–10.5)
GFR calc Af Amer: >60 mL/min (ref >60)
GFR calc non Af Amer: >60 mL/min (ref >60)
Glucose, Bld: 112 mg/dL — ABNORMAL HIGH (ref 70–99)
Potassium: 3.9 mEq/L (ref 3.5–5.1)
Sodium: 137 mEq/L (ref 135–145)

## 2010-12-25 LAB — DIFFERENTIAL
Basophils Absolute: 0 10*3/uL (ref 0.0–0.1)
Basophils Relative: 1 % (ref 0–1)
Lymphocytes Relative: 32 % (ref 12–46)
Monocytes Absolute: 0.6 10*3/uL (ref 0.1–1.0)
Neutro Abs: 2.7 10*3/uL (ref 1.7–7.7)

## 2010-12-25 LAB — CBC
HCT: 36.7 % — ABNORMAL LOW (ref 39.0–52.0)
Hemoglobin: 11.9 g/dL — ABNORMAL LOW (ref 13.0–17.0)
MCHC: 32.4 g/dL (ref 30.0–36.0)
RDW: 13.6 % (ref 11.5–15.5)
WBC: 5.1 10*3/uL (ref 4.0–10.5)

## 2010-12-25 LAB — APTT: aPTT: 29 seconds (ref 24–37)

## 2010-12-25 LAB — PROTIME-INR: INR: 0.93 (ref 0.00–1.49)

## 2010-12-26 ENCOUNTER — Observation Stay (HOSPITAL_COMMUNITY): Payer: Medicare Other

## 2010-12-26 DIAGNOSIS — R079 Chest pain, unspecified: Secondary | ICD-10-CM

## 2010-12-26 LAB — CBC
HCT: 37 % — ABNORMAL LOW (ref 39.0–52.0)
MCH: 28.9 pg (ref 26.0–34.0)
MCHC: 32.4 g/dL (ref 30.0–36.0)
MCV: 89.2 fL (ref 78.0–100.0)
Platelets: 168 10*3/uL (ref 150–400)
RDW: 13.6 % (ref 11.5–15.5)

## 2010-12-26 LAB — BASIC METABOLIC PANEL
CO2: 28 mEq/L (ref 19–32)
Chloride: 104 mEq/L (ref 96–112)
GFR calc non Af Amer: >60 mL/min (ref >60)
Glucose, Bld: 95 mg/dL (ref 70–99)
Potassium: 3.6 mEq/L (ref 3.5–5.1)
Sodium: 138 mEq/L (ref 135–145)

## 2010-12-26 LAB — CARDIAC PANEL(CRET KIN+CKTOT+MB+TROPI)
Relative Index: INVALID (ref 0.0–2.5)
Total CK: 54 U/L (ref 7–232)
Troponin I: <0.3 ng/mL (ref <0.30)

## 2010-12-26 LAB — DIFFERENTIAL
Eosinophils Absolute: 0.1 10*3/uL (ref 0.0–0.7)
Eosinophils Relative: 2 % (ref 0–5)
Lymphocytes Relative: 42 % (ref 12–46)
Lymphs Abs: 2.1 10*3/uL (ref 0.7–4.0)
Monocytes Absolute: 0.6 10*3/uL (ref 0.1–1.0)

## 2010-12-26 LAB — HEPATIC FUNCTION PANEL
Alkaline Phosphatase: 55 U/L (ref 39–117)
Indirect Bilirubin: 0.1 mg/dL — ABNORMAL LOW (ref 0.3–0.9)
Total Protein: 6.2 g/dL (ref 6.0–8.3)

## 2010-12-26 LAB — LIPID PANEL
Cholesterol: 126 mg/dL (ref 0–200)
HDL: 64 mg/dL (ref >39)
LDL Cholesterol: 44 mg/dL (ref 0–99)

## 2010-12-26 MED ORDER — IOHEXOL 350 MG/ML SOLN
100.0000 mL | Freq: Once | INTRAVENOUS | Status: AC | PRN
  Administered 2010-12-26: 100 mL via INTRAVENOUS

## 2010-12-27 ENCOUNTER — Ambulatory Visit (INDEPENDENT_AMBULATORY_CARE_PROVIDER_SITE_OTHER): Payer: Medicare Other | Admitting: *Deleted

## 2010-12-27 DIAGNOSIS — I10 Essential (primary) hypertension: Secondary | ICD-10-CM

## 2010-12-27 DIAGNOSIS — I2510 Atherosclerotic heart disease of native coronary artery without angina pectoris: Secondary | ICD-10-CM

## 2010-12-27 NOTE — Progress Notes (Signed)
Graded Exercise Test  Indication: 69-year-old gentleman with known coronary disease and prior percutaneous intervention; recent hospital admission for chest discomfort  Procedure: Treadmill exercise performed to a workload of 11.3 METs and a heart rate of 148, 96% of age-predicted maximum.  Exercise discontinued due to dyspnea and fatigue; no chest discomfort reported.  Blood pressure increased from resting value of 100/65 to180/80 early in recovery, a normal response.  No arrhythmias detected.  EKG: Normal sinus rhythm; left atrial abnormality; prominent QRS voltage; otherwise normal. Stress EKG:  No significant change.  Impression: Negative treadmill stress test revealing good exercise tolerance and no symptoms or EKG findings to suggest myocardial ischemia.  Other findings as noted. 

## 2010-12-27 NOTE — H&P (Signed)
NAME:  [name] [name], [name] [name].:  [id number]  MEDICAL RECORD NO.:  [id number]           PATIENT TYPE:  O  LOCATION:  A212                          FACILITY:  [facility]  PHYSICIAN:  Vania Rea, M.D. DATE OF BIRTH:  11/22/1940  DATE OF ADMISSION:  12/25/2010 DATE OF DISCHARGE:  LH                             HISTORY & PHYSICAL   PRIMARY CARE PHYSICIAN:  Dr. Samuel Jester.  CARDIOLOGIST:  Bevelyn Buckles. Bensimhon, MD  DICTATING PHYSICIAN:  Vania Rea, MD  CHIEF COMPLAINT:  Chest pain this afternoon.  HISTORY OF PRESENT ILLNESS:  This is a 70 year old Caucasian gentleman with a history of coronary artery disease status post drug-eluting stent placement in 2006 who also has a history of hypertension, GERD, hyperlipidemia and depression was in his baseline state of health until he began to feel dizzy and weak while in his kitchen.  The patient went to lie down but the dizziness was soon succeeded by a pressing chest pain which he rated as about to 7/10 associated with nausea and dizziness but no diaphoresis.  The patient was also feeling somewhat short of breath.  The patient notified family members who decided he should go to the emergency room.  He arrived to the emergency room about 1 hour 15 minutes later, received sublingual nitroglycerin x3 which took his pain from 7/10 down to 0.  The hospitalist service was called to assist with management.  The patient denies any fever or coffee, denies any passage of bloody or black stool.  He denies any vomiting or diarrhea.  PAST MEDICAL HISTORY: 1. Coronary artery disease status post stent placement as noted above. 2. Depression. 3. Hyperlipidemia. 4. Hypertension. 5. Status post ablation x3 for supraventricular tachycardia. 6. Chronic bradycardia status post therapy. 7. Status post lumbar disk surgery.  MEDICATIONS: 1. Protonix 40 mg daily. 2. Vicodin 5/500 as needed. 3. Ativan 0.5 mg twice daily as  needed. 4. Aricept 10 mg alternating with 23 mg daily.  The patient has been     on this for the past 2 weeks per his neurologist and in the next     couple of days should be switching to a steady dose of 23 mg daily. 5. Lipitor 20 mg daily. 6. Aspirin 81 mg daily. 7. Plavix 75 mg daily. 8. Niacin 1000 mg daily. 9. Lexapro 20 mg daily. 10.Calcium with vitamin D and multivitamins.  ALLERGIES:  No known drug allergies.  SOCIAL HISTORY:  He is a former smoker, occasional alcohol.  Lives with his wife.  FAMILY HISTORY:  Significant for 2 sisters with cancers, lung, and stomach.  Two brothers with coronary artery disease.  REVIEW OF SYSTEMS:  Other than noted above unremarkable.  PHYSICAL EXAM:  GENERAL:  Depressed looking middle-aged Caucasian gentleman reclining in the stretcher. VITAL SIGNS:  Temperature is 97.3, pulse 57, respirations 16, blood pressure 104/66.  He is saturating 100% on 2 L. HEENT:  Pupils are round and equal.  Mucous membranes are pink, anicteric.  He is not dehydrated.  No cervical lymphadenopathy.  No thyromegaly or carotid bruit. CHEST:  Clear to auscultation bilaterally.  There  is no reproducible chest wall tenderness. CARDIOVASCULAR:  Regular rhythm.  No murmurs. ABDOMEN:  Soft, nontender.  No masses. EXTREMITIES:  Without edema. CENTRAL NERVOUS SYSTEM:  Cranial nerves II-XII are grossly intact.  He has no focal lateralizing signs.  LABORATORY FINDINGS:  White count is 5.1, hemoglobin 11.9, platelets 162 with a normal differential on his white count.  Sodium is 137, potassium 3.9, chloride 101, CO2 27, glucose 112, BUN 15, creatinine 0.9, calcium 9.6.  His PT, PTT and INR all normal.  Plain chest x-ray shows mild chronic bronchitic and interstitial changes.  No acute abnormalities. His cardiac enzymes are completely normal with undetectable CK-MB and troponins and a myoglobin of 35.  His EKG shows normal sinus rhythm with no ST-segment  changes.  ASSESSMENT: 1. Unstable angina as evidenced by chest pain relieved by     nitroglycerin. 2. Depression. 3. Hyperlipidemia. 4. Coronary artery disease. 5. Mild early dementia.  PLAN:  We will bring this gentleman on observation and get serial cardiac enzymes.  If his cardiac enzymes continue to be negative, we will consider consult Cardiology versus discharge for outpatient cardiac followup. We will keep him n.p.o. after midnight in case the cardiologist decides to stress him in the morning.     Vania Rea, M.D.     LC/MEDQ  D:  12/26/2010  T:  12/26/2010  Job:  [id number]  cc:   Samuel Jester, MD Fax: [phone]  Bevelyn Buckles. Bensimhon, MD [address] [place] [address]  Electronically Signed by Vania Rea M.D. on 12/27/2010 09:56:40 PM

## 2010-12-28 NOTE — Discharge Summary (Signed)
NAME:  [name] [name], [name] [name] NO.:  [id number]  MEDICAL RECORD NO.:  [id number]           PATIENT TYPE:  O  LOCATION:  A212                          FACILITY:  [facility]  PHYSICIAN:  Elliot Cousin, M.D.    DATE OF BIRTH:  12/26/1940  DATE OF ADMISSION:  12/25/2010 DATE OF DISCHARGE:  05/22/2012LH                              DISCHARGE SUMMARY   DISCHARGE DIAGNOSES: 1. Chest pain in a patient with coronary artery disease.  Myocardial     infarction ruled out. 2. Dyslipidemia.  The patient's fasting lipid profile revealed a total     cholesterol of 126, triglycerides of 90, HDL cholesterol of 64, and     LDL cholesterol of 44. 3. Borderline hypokalemia. 4. Elevated D-dimer.  CT scan negative for pulmonary embolism. 5. Focal nodular airspace consolidation in the posterior left lower     lobe per CT of the chest.  Followup CT is recommended in 3-6 months     to confirm appropriate resolution. 6. Obstructive sleep apnea, noncompliant with the CPAP. 7. Gastroesophageal reflux disease. 8. Mild anemia with a hemoglobin of 12.0. 9. Stable 4.4-cm ascending aortic aneurysm.  DISCHARGE MEDICATIONS: 1. Aricept 10 mg daily to be changed to 23 mg daily per his primary     care physician. 2. Aspirin 81 mg daily. 3. Ativan 1 mg b.i.d. as needed. 4. Calcium carbonate with vitamin D 600 mg 1 tablet b.i.d. 5. Cyclobenzaprine 10 mg every 8 hours as needed for muscle spasm. 6. Fish oil 1 gram 2 capsules daily. 7. Hydrocodone/APAP 5/500 mg 1 tablet every 6 hours as needed for     pain. 8. Lexapro 20 mg daily. 9. Lipitor 40 mg nightly. 10.Multivitamin 1 tablet daily. 11.Sublingual nitroglycerin 0.4 mg under the tongue every 5 minutes up     to three doses as needed for chest pain. 12.Plavix 75 mg daily. 13.Protonix 40 mg daily. 14.Vitamin C 1000 mg daily. 15.Lysine 500 mg daily.  DISCHARGE DISPOSITION:  The patient was discharged to home in improved and stable condition on Dec 26, 2010.  Dr. Dietrich Pates plans to perform a stress test tomorrow morning on Dec 27, 2010.  CONSULTATIONS:  Gerrit Friends. Dietrich Pates, MD, [facility]  PROCEDURE PERFORMED: 1. CT angiogram of the chest on Dec 26, 2010.  The results revealed     no acute PE, stable ascending aortic aneurysm measuring     4.4 cm, extensive coronary and aortic calcifications, and focal     nodular airspace consolidation in the posterior left lower lobe,     possibly pneumonia. 2. Chest x-ray on Dec 25, 2010.  The results revealed mild chronic     bronchitic and interstitial changes, but no acute abnormalities.  HISTORY OF PRESENT ILLNESS:  The patient is a 70 year old man with a past medical history significant for coronary artery disease, status post drug-eluting stent placement in 2006, hyperlipidemia, and depression.  He presented to the emergency department on Dec 25, 2010, with a chief complaint of chest pain.  Apparently, he received sublingual nitroglycerin in the emergency department.  It decreased his pain from 7/10 to 0/10.  The patient was admitted for further evaluation and management.  HOSPITAL COURSE:  The patient was restarted on most if not all of his chronic medications.  Even though there was a reported history of hypertension, the patient's blood pressure was actually on the low normal side.  Beta blockade therapy was not given due to relatively low normal blood pressures.  His pain was treated with as-needed sublingual nitroglycerin and as-needed morphine.  The patient required neither during the hospitalization.  His EKG on admission revealed normal sinus rhythm with a heart rate of 64 beats per minute with no ST or T-wave abnormalities.  His chest x-ray on admission revealed mild chronic bronchitic changes, but no acute abnormalities.  For further evaluation, a number of studies were ordered.  All of his cardiac enzymes were well within normal limits.  His D-dimer was elevated at 0.78.  This  prompted an order for CT angiogram of the chest.  The CT angiogram of the chest was negative for PE; however it did reveal a focal nodular airspace consolidation in the posterior left lower lobe, possibly due to pneumonia according to the radiologist's interpretation. The patient had no complaints of cough.  He was completely afebrile. There was no leukocytosis.  Clinically, the patient did not have pneumonia, and therefore antibiotic therapy was not started.  Cardiologist, Dr. Dietrich Pates was consulted.  He evaluated the patient.  He ordered potassium chloride supplementation as the patient's serum potassium was borderline low at 3.6.  He also ordered a blood magnesium level which was 1.7.  He noted that the patient's fasting lipid profile was excellent.  He recommended an outpatient stress test as the patient was asymptomatic and had ruled out for myocardial infarction at the time of his assessment.  Therefore, Dr. Dietrich Pates arranged for the patient to come back tomorrow morning for an outpatient stress test.  DISCHARGE LABORATORY DATA:  Total bilirubin 0.2, alkaline phosphatase 55, SGOT 20, SGPT 13, total protein 6.2, albumin 3.2, CK 54, CK-MB 2.0, troponin-I less than 0.30.  Sodium 138, potassium 3.6, chloride 104, CO2 of 28, glucose 95, BUN 13, creatinine 0.94, calcium 9.5.  WBC 5.0, hemoglobin 12.0, platelet count 168.     Elliot Cousin, M.D.     DF/MEDQ  D:  12/26/2010  T:  12/27/2010  Job:  [id number]  cc:   Samuel Jester, MD Fax: [phone]  Bevelyn Buckles. Bensimhon, MD [address] [place] [address]  Electronically Signed by Elliot Cousin M.D. on 12/28/2010 08:24:13 PM

## 2011-01-03 NOTE — Progress Notes (Signed)
Graded Exercise Test  Indication: 71 year old gentleman with known coronary disease and prior percutaneous intervention; recent hospital admission for chest discomfort  Procedure: Treadmill exercise performed to a workload of 11.3 METs and a heart rate of 148, 96% of age-predicted maximum.  Exercise discontinued due to dyspnea and fatigue; no chest discomfort reported.  Blood pressure increased from resting value of 100/65 to180/80 early in recovery, a normal response.  No arrhythmias detected.  EKG: Normal sinus rhythm; left atrial abnormality; prominent QRS voltage; otherwise normal. Stress EKG:  No significant change.  Impression: Negative treadmill stress test revealing good exercise tolerance and no symptoms or EKG findings to suggest myocardial ischemia.  Other findings as noted.

## 2011-02-01 ENCOUNTER — Telehealth: Payer: Self-pay | Admitting: Cardiology

## 2011-02-01 NOTE — Telephone Encounter (Signed)
Pt is requesting a Occupational Health [employer]  To fax copies and print out for patient. Fx [phone], ph:[phone]

## 2011-02-02 NOTE — Telephone Encounter (Signed)
Faxed stress test to occupational health at [facility]

## 2011-02-05 NOTE — Consult Note (Signed)
NAME:  [name] [name], [name] [name] NO.:  [id number]  MEDICAL RECORD NO.:  [id number]           PATIENT TYPE:  O  LOCATION:  A212                          FACILITY:  [facility]  PHYSICIAN:  Gerrit Friends. Dietrich Pates, MD, FACCDATE OF BIRTH:  September 13, 1940  DATE OF CONSULTATION:  12/26/2010 DATE OF DISCHARGE:  12/26/2010                                CONSULTATION   PRIMARY CARDIOLOGIST:  Bevelyn Buckles. Bensimhon, MD  PRIMARY CARE PHYSICIAN:  Dr. Charm Barges.  REASON FOR CONSULTATION:  Chest pain.  HISTORY OF PRESENT ILLNESS:  A 70 year old Caucasian male with known history of CAD with drug-eluting stent to the LAD and circumflex in 2006 with a history of COPD, hyperlipidemia, and obstructive sleep apnea on CPAP admitted secondary to chest pain, dizziness, and nausea with mild shortness of breath.  The patient apparently had a very large lunch. Approximately 2 hours later, he began to have generalized weakness and shortness of breath and chest pressure lasting approximately 3 hours. With his history of CAD, he presented to the emergency room where he was given sublingual nitroglycerin x3 with relief of pain and placed on oxygen at 2 liters.  On arrival, the patient's blood pressure was 113/73 with a heart rate of 67.  Cardiac enzymes were cycled and found to be negative x3.  The patient has had no recurrence of chest pain since that time.  He said no recurrence of nausea or vomiting.  The pain is not similar to the pain or pressure that he experienced prior to having his myocardial infarction with stent placement in 2006.  He admits to having had similar discomfort in his abdomen along with shortness of breath and weakness after being not eating for several hours 1 day and not taking his medications which subsided on its own.  His wife who is a diabetic did take his blood sugar this recent episode and was found to be normal as at 170 three hours post feeding.  Currently, the patient is resting in  bed without complaint.  REVIEW OF SYSTEMS:  Shortness of breath, chest pressure, weakness after eating a heavy male with some mild abdominal pain with some nausea.  All other systems are reviewed and found to be negative.  PAST MEDICAL HISTORY: 1. CAD status post Taxus drug-eluting stent to the LAD and circumflex     in 2006. 2. Last cardiac catheterization March 2007 revealing patent stents     with an EF of 60%. 3. History of COPD. 4. Hyperlipidemia. 5. Glucose intolerance. 6. Obstructive sleep apnea, on CPAP. 7. SVT status post ablation. 8. GERD. 9. Depression and anxiety. 10.Bradycardia, intolerant to beta-blockers. 11.Alzheimer disease, early, recent diagnosis.  PAST SURGICAL HISTORY:  Cervical spine repaired, lumbar disk surgery, hemorrhoidectomy, and tonsillectomy.  SOCIAL HISTORY:  He lives in [place] with his wife.  He is a retired [employer] architect.  He is married.  He quit smoking 8 years ago.  Negative for EtOH or drug use.  FAMILY HISTORY:  Mother deceased in her 45s from old age.  Father deceased from Alzheimer's.  He has 2 brothers with CAD and Alzheimer's and a sister  with Alzheimer's.  ALLERGIES:  Intolerance to BETA-BLOCKER causing significant bradycardia, otherwise no known drug allergies.  MEDICATIONS: 1. Protonix 40 mg daily. 2. Vicodin 5 mg/500 mg p.r.n. 3. Ativan 0.5 mg p.r.n. 4. Aricept 10 mg with increasing to 23 mg this week which he takes     daily. 5. Lipitor 20 mg daily. 6. Aspirin 81 mg daily. 7. Niacin 1000 mg daily. 8. Lexapro 20 mg daily. 9. Calcium with vitamin D daily. 10.Fish oil daily. 11.Vitamin C and D daily.  CURRENT LABORATORY DATA:  Sodium 138, potassium 3.6, chloride 104, CO2 of 28, BUN 13, creatinine 0.94, and glucose 95.  Hemoglobin 12.0, hematocrit 37.0, white blood cells 5.0, and platelets 168.  Troponin less than 0.05, then less than 30.0, and less than 3.0 respectively.  PT 12.7, INR 0.93.  Magnesium 1.7.  EKG  revealing normal sinus rhythm with a rate of 64 beats per minute.  RADIOLOGY:  Mild bronchitic changes, but no acute abnormalities.  PHYSICAL EXAMINATION:  VITAL SIGNS:  Blood pressure 104/67, pulse 71, respirations 18, temperature 98.3, and O2 saturation 99% on 1.5 liter nasal cannula. GENERAL:  He is awake and alert and oriented.  He has a flat affect.  He responds appropriately. HEENT:  Head is normocephalic and atraumatic.  Eyes:  PERRLA. NECK:  Supple.  There is a right carotid bruit, but negative on the left.  No JVD is noted. CARDIOVASCULAR:  Regular rate and rhythm with a 1/6 systolic murmur auscultated.  Pulses are 2+ and equal without bruits. LUNGS:  Clear to auscultation without wheezes, rales, or rhonchi. ABDOMEN:  Soft and nontender.  There is no tenderness with palpation of the right upper quadrant.  Negative Murphy sign.  EXTREMITIES:  Without clubbing, cyanosis, or edema. MUSCULOSKELETAL:  No joint deformity. NEUROLOGIC:  Cranial nerves II through XII are grossly intact.  IMPRESSION: 1. Atypical chest pain with normal EKG and negative cardiac enzymes,     relieved by nitroglycerin, and evaluated by Dr. Gala Romney     approximately 1 month ago without definite etiology identified.       Patient describes significant postprandial symptoms, and GI etiology a consideration.     We will also check H. pylori.  Primary service may wish to consider GI consultation,      either on an inpatient or outpatient basis. 2. Hypomagnesemia with mild hypokalemia.  We will replete both.   Bettey Mare. Lyman Bishop, NP   ______________________________ Gerrit Friends. Dietrich Pates, MD, [facility]    KML/MEDQ  D:  12/26/2010  T:  12/27/2010  Job:  [id number]  cc:   Bevelyn Buckles. Bensimhon, MD [address], [place] [address]  Dr. Charm Barges  Electronically Signed by Joni Reining NP on 01/01/2011 08:52:49 PM Electronically Signed by Redford Bing MD [facility] on 02/05/2011 04:59:03 PM

## 2011-05-08 LAB — HEMOGLOBIN AND HEMATOCRIT, BLOOD: HCT: 41.1

## 2011-05-08 LAB — BASIC METABOLIC PANEL
CO2: 29
Chloride: 103
Creatinine, Ser: 0.98
GFR calc Af Amer: >60
Sodium: 141

## 2011-10-17 ENCOUNTER — Telehealth (HOSPITAL_COMMUNITY): Payer: Self-pay | Admitting: Adult Health

## 2011-10-17 ENCOUNTER — Telehealth: Payer: Self-pay | Admitting: Internal Medicine

## 2011-10-17 NOTE — Telephone Encounter (Signed)
Spoke with pt wife, aware will send message to dr bensimhon and let her know.

## 2011-10-17 NOTE — Telephone Encounter (Signed)
Message copied by Tonye Becket D on Wed Oct 17, 2011  7:55 PM ------      Message from: Arvilla Meres R      Created: Wed Oct 17, 2011  3:06 PM       He can stop for a while.            Please have him f/u with me in HF clinic. He is cranky but i have followed him for a long time                  ----- Message -----         From: Sherald Hess, NP         Sent: 10/17/2011   1:19 PM           To: Dolores Patty, MD                        ----- Message -----         From: Deliah Goody, RN         Sent: 10/17/2011  10:05 AM           To: Sherald Hess, NP            HEY,hope you are having a great day. Can you ask dan for me, this pt had a nose bleed and had to have packing at the ED. They stopped his plavix for one week. As far as i can tell his last procedure was 2006. At that time he had two taxus stents placed. relook cath in 2007 was fine. Can we stop his plavix or does he need to cont? thanks

## 2011-10-17 NOTE — Telephone Encounter (Signed)
Pt's wife calling re plavix, pt had bad nose bleed sunday had to go to ER, was told to go off plavix and asa for about a week, will send ENT this am, wants to know what dr bensimhon thinks about this? can be reached until 1030a or after 100p

## 2011-10-19 NOTE — Telephone Encounter (Signed)
Discussed pt issues with dr bensimhon, okay for pt to hold plavix for the week but the pt does need to restart. Pt wife aware and the number to the heart failure clinic given to pt for them to call to schedule an appt.

## 2011-11-05 ENCOUNTER — Ambulatory Visit (HOSPITAL_COMMUNITY)
Admission: RE | Admit: 2011-11-05 | Discharge: 2011-11-05 | Disposition: A | Discharge status: Home / Self Care | Payer: Medicare Other | Source: Ambulatory Visit | Attending: Internal Medicine | Admitting: Internal Medicine

## 2011-11-05 ENCOUNTER — Encounter (HOSPITAL_COMMUNITY): Payer: Self-pay

## 2011-11-05 VITALS — BP 112/64 | HR 71 | Wt 167.0 lb

## 2011-11-05 DIAGNOSIS — I2510 Atherosclerotic heart disease of native coronary artery without angina pectoris: Secondary | ICD-10-CM | POA: Insufficient documentation

## 2011-11-05 DIAGNOSIS — E785 Hyperlipidemia, unspecified: Secondary | ICD-10-CM

## 2011-11-05 NOTE — Assessment & Plan Note (Addendum)
Goal LDL < 70. Check Lipids next week. Titrate statin as needed.

## 2011-11-05 NOTE — Patient Instructions (Addendum)
Follow up 6 months.   Stress Test next week with lipids  Resume Plavix/Aspirin after your ENT procedure.

## 2011-11-05 NOTE — Assessment & Plan Note (Addendum)
CP with typical and atypical features. Did get relief with NTG but has not recurred. ECG normal. Will repeat stress test next week. He will re-start plavix and Aspirin after ENT procedure completed. Follow up in 6 months

## 2011-11-06 ENCOUNTER — Ambulatory Visit (INDEPENDENT_AMBULATORY_CARE_PROVIDER_SITE_OTHER): Payer: Medicare Other | Admitting: Nurse Practitioner

## 2011-11-06 ENCOUNTER — Encounter: Payer: Self-pay | Admitting: Nurse Practitioner

## 2011-11-06 VITALS — BP 124/88 | HR 81 | Resp 20

## 2011-11-06 DIAGNOSIS — I2510 Atherosclerotic heart disease of native coronary artery without angina pectoris: Secondary | ICD-10-CM

## 2011-11-06 DIAGNOSIS — R079 Chest pain, unspecified: Secondary | ICD-10-CM

## 2011-11-06 DIAGNOSIS — Z01818 Encounter for other preprocedural examination: Secondary | ICD-10-CM

## 2011-11-06 NOTE — Patient Instructions (Signed)
Your treadmill looks good today.   Call us for any problems or return of symptoms.  See Dr. Gala Romney at your regular appointment.  Call the [facility] office at [phone] if you have any questions, problems or concerns.

## 2011-11-06 NOTE — Progress Notes (Signed)
Exercise Treadmill Test  Pre-Exercise Testing Evaluation Rhythm: normal sinus  Rate: 81   PR:  .15 QRS:  .09  QT:  .37 QTc: .43           Test  Exercise Tolerance Test Ordering MD: Arvilla Meres, MD  Interpreting MD:  Norma Fredrickson, NP  Unique Test No: 1  Treadmill:  1  Indication for ETT: chest pain - rule out ischemia  Contraindication to ETT: No   Stress Modality: exercise - treadmill  Cardiac Imaging Performed: non   Protocol: standard Bruce - maximal  Max BP:  163/84  Max MPHR (bpm):  150 85% MPR (bpm):  129  MPHR obtained (bpm): 150 % MPHR obtained:  100%  Reached 85% MPHR (min:sec): 5:25 Total exercise time: 7:15  Workload in METS:  8.9 Borg Scale: 17  Reason ETT Terminated:  patient's desire to stop    ST Segment Analysis At Rest: normal ST segments - no evidence of significant ST depression With Exercise: no evidence of significant ST depression  Other Information Arrhythmia:  No Angina during ETT:  absent (0) Quality of ETT:  diagnostic  ETT Interpretation:  normal - no evidence of ischemia by ST analysis  Comments: Patient presented today for GXT. Has had a short lived episode of chest pain at rest back on 11/02/11. Took a total of 2 NTG. Has had no recurrence. Has not been active over the last 2 months due to illness.  He is scheduled for a polyp removal due to epistaxis. Remains off of aspirin and Plavix in light of upcoming surgery. GXT for surgery clearance.   Patient exercised on the standard Bruce protocol for a total of 7:15. Fair and reduced exercise tolerance. Adequate blood pressure response. Clinically negative for chest pain. EKG negative for ischemia.   Recommendations: GXT is negative. May proceed with his surgery with usual precautions. If his chest pain returns, further testing will be warranted. Patient is agreeable to this plan and will call if any problems develop in the interim.

## 2011-11-07 ENCOUNTER — Telehealth (HOSPITAL_COMMUNITY): Payer: Self-pay | Admitting: *Deleted

## 2011-11-07 NOTE — Telephone Encounter (Signed)
Please call patient in regards to his stress test 4/2 that he passed, need results sent to [name] [name] at [facility] in [place]

## 2011-11-07 NOTE — Telephone Encounter (Signed)
Informed patient we have faxed results to Dr. Andrey Campanile (fax [phone]).

## 2011-11-09 NOTE — Progress Notes (Signed)
Patient ID: MACIAH SCHWEIGERT, male   DOB: [birth year], 71 y.o.   MRN: [id number]  HPI: Waymon is a very pleasant 71 year old male with a history of coronary artery disease, status post Taxus drug-eluting stent to the LAD and left circumflex in January 2006.  Catheterization on March 2007 showed patent stents with an EF of 60%.  He also has COPD, hyperlipidemia, glucose intolerance, obstructive sleep apnea on CPAP and history of SVT status post ablation and bradycardia, which has prohibited beta-blocker therapy.  Negative ETT 6/11 and 5/12. (9:00 on Bruce)  Had carotid u/s at neurologist in [place] (w/u of family h/o Alzheimer's). Told carotid flow was "slow". Doesn't know precentages.  He had a nose bleed 10/16/11 and he requried over night stay at [facility]. Off Plavix/Aspirin for 1 week. Scheduled for polyp removal 11-08-11. He stopped  plavix and Aspirin 11-02-11. He has stopped ambulating daily due to bleeding.  Chest Pain 11-02-11 while sitting down which lasted for 3 minutes. CP resolved with nitorglycerin.   Denies SOB/PND/Orthopnea. Denies CP ambulating/ upstairs.     ROS: All systems negative except as listed in HPI, PMH and Problem List.  Past Medical History  Diagnosis Date  . Coronary artery disease     taxes DES to LAD & disease/ Cath 2/07 patents stents with an EF 60%                 t  . Hypertension   . Hyperlipidemia     myagias with statin  . SVT (supraventricular tachycardia)     s/p ablation with residual braycardia prohibiting beta blocker  . COPD (chronic obstructive pulmonary disease)     followed by DR Sherene Sires  . Sleep apnea   . Anxiety   . GERD (gastroesophageal reflux disease)   . Glucose intolerance (impaired glucose tolerance)   . Osteoarthritis   . Carotid stenosis   . Epistaxis March 2013    Current Outpatient Prescriptions  Medication Sig Dispense Refill  . atorvastatin (LIPITOR) 20 MG tablet Take 20 mg by mouth daily.        Marland Kitchen escitalopram (LEXAPRO)  20 MG tablet Take 20 mg by mouth daily.        . fish oil-omega-3 fatty acids 1000 MG capsule Take 2 g by mouth daily.        Marland Kitchen LORazepam (ATIVAN) 1 MG tablet Take 1 mg by mouth every 8 (eight) hours.        . Lysine 500 MG CAPS Take by mouth.        . niacin (NIASPAN) 1000 MG CR tablet Take 2,000 mg by mouth.       . pantoprazole (PROTONIX) 40 MG tablet Take 40 mg by mouth daily.        . QUEtiapine (SEROQUEL) 50 MG tablet Take 150 mg by mouth at bedtime.      . Tamsulosin HCl (FLOMAX) 0.4 MG CAPS Take 0.4 mg by mouth.        Marland Kitchen aspirin 81 MG tablet Take 81 mg by mouth daily.        . clopidogrel (PLAVIX) 75 MG tablet Take 75 mg by mouth daily.        . cyclobenzaprine (FLEXERIL) 5 MG tablet Take 5 mg by mouth 3 (three) times daily as needed.           PHYSICAL EXAM: Filed Vitals:   11/05/11 1422  BP: 112/64  Pulse: 71   Weight change:  General:  Well appearing. No  resp difficulty HEENT: normal Neck: supple. JVP flat. Carotids 2+ bilaterally; no bruits. No lymphadenopathy or thryomegaly appreciated. Cor: PMI normal. Regular rate & rhythm. No rubs, gallops or murmurs. Lungs: clear Abdomen: soft, nontender, nondistended. No hepatosplenomegaly. No bruits or masses. Good bowel sounds. Extremities: no cyanosis, clubbing, rash, edema Neuro: alert & orientedx3, cranial nerves grossly intact. Moves all 4 extremities w/o difficulty. Affect pleasant.    ECG: NSR 60 No ST-T wave abnormalities.     ASSESSMENT & PLAN:

## 2011-11-15 ENCOUNTER — Ambulatory Visit (INDEPENDENT_AMBULATORY_CARE_PROVIDER_SITE_OTHER): Payer: Medicare Other | Admitting: *Deleted

## 2011-11-15 DIAGNOSIS — E785 Hyperlipidemia, unspecified: Secondary | ICD-10-CM

## 2011-11-15 LAB — LIPID PANEL
Cholesterol: 193 mg/dL (ref 0–200)
Triglycerides: 72 mg/dL (ref 0.0–149.0)

## 2011-11-16 ENCOUNTER — Other Ambulatory Visit: Payer: Medicare Other

## 2011-11-16 ENCOUNTER — Encounter: Payer: Medicare Other | Admitting: Nurse Practitioner

## 2011-11-21 ENCOUNTER — Telehealth (HOSPITAL_COMMUNITY): Payer: Self-pay | Admitting: *Deleted

## 2011-11-21 MED ORDER — ATORVASTATIN CALCIUM 40 MG PO TABS: 40.0000 mg | ORAL_TABLET | Freq: Every day | ORAL | Status: DC

## 2011-11-21 NOTE — Telephone Encounter (Signed)
Message copied by Noralee Space on Wed Nov 21, 2011  4:10 PM ------      Message from: Dolores Patty      Created: Fri Nov 16, 2011 11:54 PM       Increase lipitor to 40 daily

## 2012-03-28 ENCOUNTER — Encounter (HOSPITAL_COMMUNITY): Payer: Self-pay | Admitting: Pharmacy Technician

## 2012-04-01 ENCOUNTER — Encounter (HOSPITAL_COMMUNITY): Payer: Self-pay

## 2012-04-01 ENCOUNTER — Encounter (HOSPITAL_COMMUNITY)
Admission: RE | Admit: 2012-04-01 | Discharge: 2012-04-01 | Disposition: A | Discharge status: Home / Self Care | Payer: Medicare Other | Source: Ambulatory Visit | Attending: Orthopedic Surgery | Admitting: Orthopedic Surgery

## 2012-04-01 HISTORY — DX: Unspecified hemorrhoids: K64.9

## 2012-04-01 HISTORY — DX: Personal history of colonic polyps: Z86.010

## 2012-04-01 HISTORY — DX: Major depressive disorder, single episode, unspecified: F32.9

## 2012-04-01 HISTORY — DX: Insomnia, unspecified: G47.00

## 2012-04-01 HISTORY — DX: Other amnesia: R41.3

## 2012-04-01 HISTORY — DX: Depression, unspecified: F32.A

## 2012-04-01 HISTORY — DX: Personal history of colon polyps, unspecified: Z86.0100

## 2012-04-01 LAB — CBC
HCT: 38.1 % — ABNORMAL LOW (ref 39.0–52.0)
Hemoglobin: 12 g/dL — ABNORMAL LOW (ref 13.0–17.0)
MCV: 82.5 fL (ref 78.0–100.0)
RBC: 4.62 MIL/uL (ref 4.22–5.81)
WBC: 5.6 10*3/uL (ref 4.0–10.5)

## 2012-04-01 LAB — BASIC METABOLIC PANEL
CO2: 28 mEq/L (ref 19–32)
Chloride: 102 mEq/L (ref 96–112)
Creatinine, Ser: 1.02 mg/dL (ref 0.50–1.35)
Sodium: 138 mEq/L (ref 135–145)

## 2012-04-01 MED ORDER — CHLORHEXIDINE GLUCONATE 4 % EX LIQD: 60.0000 mL | Freq: Once | CUTANEOUS | Status: DC

## 2012-04-01 NOTE — Progress Notes (Addendum)
Sleep study was done in 2007-but doesn't use CPAP

## 2012-04-01 NOTE — Pre-Procedure Instructions (Signed)
20 [name] [name]  04/01/2012   Your procedure is scheduled on:  Thurs, Aug 29 @ 10:00 AM  Report to [facility] at 8:00 AM.  Call this number if you have problems the morning of surgery: [phone]   Remember:   Do not eat food:After Midnight.    Take these medicines the morning of surgery with A SIP OF WATER: Escitalopram(Lexapro),Pantoprazole(Protonix),Ativan(Lorazepam),and Tamsulosin(Flomax)   Do not wear jewelry  Do not wear lotions, powders, or colognes  Men may shave face and neck.  Do not bring valuables to the hospital.  Contacts, dentures or bridgework may not be worn into surgery.  Leave suitcase in the car. After surgery it may be brought to your room.  For patients admitted to the hospital, checkout time is 11:00 AM the day of discharge.   Patients discharged the day of surgery will not be allowed to drive home.  Special Instructions: CHG Shower Use Special Wash: 1/2 bottle night before surgery and 1/2 bottle morning of surgery.   Please read over the following fact sheets that you were given: Pain Booklet, Coughing and Deep Breathing, MRSA Information and Surgical Site Infection Prevention

## 2012-04-01 NOTE — Progress Notes (Addendum)
Cardiologist is Dr.Bensimon with last ov in epic  Denies ever having an echo  Heart cath report in epic from 2001/2006/2007  Stress test in epic from 01/03/11  Note in epic from Dr.Bensimon that pt was to have a stress test in 11/2011--pt states that he was not aware of this  EKG in epic from 11/05/11 CXR >52yr ago

## 2012-04-02 MED ORDER — DEXTROSE 5 % IV SOLN
3.0000 g | INTRAVENOUS | Status: DC
  Filled 2012-04-02: qty 3000

## 2012-04-03 ENCOUNTER — Encounter (HOSPITAL_COMMUNITY): Payer: Self-pay | Admitting: Anesthesiology

## 2012-04-03 ENCOUNTER — Encounter (HOSPITAL_COMMUNITY)
Admission: RE | Disposition: A | Discharge status: Home / Self Care | Payer: Self-pay | Source: Ambulatory Visit | Attending: Orthopedic Surgery

## 2012-04-03 ENCOUNTER — Ambulatory Visit (HOSPITAL_COMMUNITY): Payer: Medicare Other | Admitting: Anesthesiology

## 2012-04-03 ENCOUNTER — Ambulatory Visit (HOSPITAL_COMMUNITY)
Admission: RE | Admit: 2012-04-03 | Discharge: 2012-04-03 | Disposition: A | Discharge status: Home / Self Care | Payer: Medicare Other | Source: Ambulatory Visit | Attending: Orthopedic Surgery | Admitting: Orthopedic Surgery

## 2012-04-03 DIAGNOSIS — Z7982 Long term (current) use of aspirin: Secondary | ICD-10-CM | POA: Insufficient documentation

## 2012-04-03 DIAGNOSIS — M659 Unspecified synovitis and tenosynovitis, unspecified site: Secondary | ICD-10-CM | POA: Insufficient documentation

## 2012-04-03 DIAGNOSIS — Z860100 Personal history of colon polyps, unspecified: Secondary | ICD-10-CM | POA: Insufficient documentation

## 2012-04-03 DIAGNOSIS — F329 Major depressive disorder, single episode, unspecified: Secondary | ICD-10-CM | POA: Insufficient documentation

## 2012-04-03 DIAGNOSIS — M23305 Other meniscus derangements, unspecified medial meniscus, unspecified knee: Secondary | ICD-10-CM | POA: Insufficient documentation

## 2012-04-03 DIAGNOSIS — Z809 Family history of malignant neoplasm, unspecified: Secondary | ICD-10-CM | POA: Insufficient documentation

## 2012-04-03 DIAGNOSIS — G4700 Insomnia, unspecified: Secondary | ICD-10-CM | POA: Insufficient documentation

## 2012-04-03 DIAGNOSIS — M1990 Unspecified osteoarthritis, unspecified site: Secondary | ICD-10-CM | POA: Insufficient documentation

## 2012-04-03 DIAGNOSIS — F411 Generalized anxiety disorder: Secondary | ICD-10-CM | POA: Insufficient documentation

## 2012-04-03 DIAGNOSIS — J4489 Other specified chronic obstructive pulmonary disease: Secondary | ICD-10-CM | POA: Insufficient documentation

## 2012-04-03 DIAGNOSIS — G4730 Sleep apnea, unspecified: Secondary | ICD-10-CM | POA: Insufficient documentation

## 2012-04-03 DIAGNOSIS — M2240 Chondromalacia patellae, unspecified knee: Secondary | ICD-10-CM | POA: Insufficient documentation

## 2012-04-03 DIAGNOSIS — Z8261 Family history of arthritis: Secondary | ICD-10-CM | POA: Insufficient documentation

## 2012-04-03 DIAGNOSIS — K219 Gastro-esophageal reflux disease without esophagitis: Secondary | ICD-10-CM | POA: Insufficient documentation

## 2012-04-03 DIAGNOSIS — F3289 Other specified depressive episodes: Secondary | ICD-10-CM | POA: Insufficient documentation

## 2012-04-03 DIAGNOSIS — I10 Essential (primary) hypertension: Secondary | ICD-10-CM | POA: Insufficient documentation

## 2012-04-03 DIAGNOSIS — Z9861 Coronary angioplasty status: Secondary | ICD-10-CM | POA: Insufficient documentation

## 2012-04-03 DIAGNOSIS — I498 Other specified cardiac arrhythmias: Secondary | ICD-10-CM | POA: Insufficient documentation

## 2012-04-03 DIAGNOSIS — Z8249 Family history of ischemic heart disease and other diseases of the circulatory system: Secondary | ICD-10-CM | POA: Insufficient documentation

## 2012-04-03 DIAGNOSIS — Z823 Family history of stroke: Secondary | ICD-10-CM | POA: Insufficient documentation

## 2012-04-03 DIAGNOSIS — M23349 Other meniscus derangements, anterior horn of lateral meniscus, unspecified knee: Secondary | ICD-10-CM | POA: Insufficient documentation

## 2012-04-03 DIAGNOSIS — M2340 Loose body in knee, unspecified knee: Secondary | ICD-10-CM | POA: Insufficient documentation

## 2012-04-03 DIAGNOSIS — E785 Hyperlipidemia, unspecified: Secondary | ICD-10-CM | POA: Insufficient documentation

## 2012-04-03 DIAGNOSIS — J449 Chronic obstructive pulmonary disease, unspecified: Secondary | ICD-10-CM | POA: Insufficient documentation

## 2012-04-03 HISTORY — PX: KNEE ARTHROSCOPY: SHX127

## 2012-04-03 SURGERY — ARTHROSCOPY, KNEE
Anesthesia: General | Site: Knee | Laterality: Right | Wound class: Clean

## 2012-04-03 MED ORDER — ONDANSETRON HCL 4 MG/2ML IJ SOLN
INTRAMUSCULAR | Status: DC | PRN
  Administered 2012-04-03: 4 mg via INTRAVENOUS

## 2012-04-03 MED ORDER — PROPOFOL 10 MG/ML IV EMUL: INTRAVENOUS | Status: DC | PRN

## 2012-04-03 MED ORDER — BUPIVACAINE HCL (PF) 0.25 % IJ SOLN
INTRAMUSCULAR | Status: DC | PRN
  Administered 2012-04-03: 30 mL

## 2012-04-03 MED ORDER — MORPHINE SULFATE 4 MG/ML IJ SOLN
INTRAMUSCULAR | Status: AC
  Filled 2012-04-03: qty 1

## 2012-04-03 MED ORDER — MIDAZOLAM HCL 2 MG/2ML IJ SOLN: 0.5000 mg | Freq: Once | INTRAMUSCULAR | Status: DC | PRN

## 2012-04-03 MED ORDER — MEPERIDINE HCL 25 MG/ML IJ SOLN: 6.2500 mg | INTRAMUSCULAR | Status: DC | PRN

## 2012-04-03 MED ORDER — PROPOFOL 10 MG/ML IV BOLUS
INTRAVENOUS | Status: DC | PRN
  Administered 2012-04-03: 100 mg via INTRAVENOUS

## 2012-04-03 MED ORDER — ARTIFICIAL TEARS OP OINT
TOPICAL_OINTMENT | OPHTHALMIC | Status: DC | PRN
  Administered 2012-04-03: 1 via OPHTHALMIC

## 2012-04-03 MED ORDER — MORPHINE SULFATE 4 MG/ML IJ SOLN
INTRAMUSCULAR | Status: DC | PRN
  Administered 2012-04-03: 4 mg via SUBCUTANEOUS

## 2012-04-03 MED ORDER — PROMETHAZINE HCL 25 MG/ML IJ SOLN: 6.2500 mg | INTRAMUSCULAR | Status: DC | PRN

## 2012-04-03 MED ORDER — LACTATED RINGERS IV SOLN
INTRAVENOUS | Status: DC
  Administered 2012-04-03: 09:00:00 via INTRAVENOUS

## 2012-04-03 MED ORDER — LACTATED RINGERS IV SOLN: INTRAVENOUS | Status: DC

## 2012-04-03 MED ORDER — LIDOCAINE HCL (CARDIAC) 20 MG/ML IV SOLN
INTRAVENOUS | Status: DC | PRN
  Administered 2012-04-03: 30 mg via INTRAVENOUS

## 2012-04-03 MED ORDER — HYDROMORPHONE HCL PF 1 MG/ML IJ SOLN
INTRAMUSCULAR | Status: AC
  Filled 2012-04-03: qty 1

## 2012-04-03 MED ORDER — HYDROMORPHONE HCL PF 1 MG/ML IJ SOLN
0.2500 mg | INTRAMUSCULAR | Status: DC | PRN
  Administered 2012-04-03 (×2): 0.5 mg via INTRAVENOUS

## 2012-04-03 MED ORDER — FENTANYL CITRATE 0.05 MG/ML IJ SOLN
INTRAMUSCULAR | Status: DC | PRN
  Administered 2012-04-03: 50 ug via INTRAVENOUS
  Administered 2012-04-03 (×2): 25 ug via INTRAVENOUS

## 2012-04-03 MED ORDER — CEFAZOLIN SODIUM-DEXTROSE 2-3 GM-% IV SOLR
INTRAVENOUS | Status: DC | PRN
  Administered 2012-04-03: 2 g via INTRAVENOUS

## 2012-04-03 MED ORDER — OXYCODONE-ACETAMINOPHEN 5-325 MG PO TABS: 1.0000 | ORAL_TABLET | ORAL | Status: AC | PRN

## 2012-04-03 MED ORDER — MIDAZOLAM HCL 5 MG/5ML IJ SOLN
INTRAMUSCULAR | Status: DC | PRN
  Administered 2012-04-03: 2 mg via INTRAVENOUS

## 2012-04-03 MED ORDER — BUPIVACAINE HCL (PF) 0.25 % IJ SOLN
INTRAMUSCULAR | Status: AC
  Filled 2012-04-03: qty 30

## 2012-04-03 MED ORDER — SODIUM CHLORIDE 0.9 % IR SOLN
Status: DC | PRN
  Administered 2012-04-03: 6000 mL

## 2012-04-03 MED ORDER — LACTATED RINGERS IV SOLN
INTRAVENOUS | Status: DC | PRN
  Administered 2012-04-03: 09:00:00 via INTRAVENOUS

## 2012-04-03 SURGICAL SUPPLY — 32 items
BANDAGE ELASTIC 6 VELCRO ST LF (GAUZE/BANDAGES/DRESSINGS) ×2 IMPLANT
BLADE CUDA 5.5 (BLADE) IMPLANT
BLADE CUTTER GATOR 3.5 (BLADE) ×2 IMPLANT
BLADE GREAT WHITE 4.2 (BLADE) ×2 IMPLANT
BOOTCOVER CLEANROOM LRG (PROTECTIVE WEAR) ×8 IMPLANT
CLOTH BEACON ORANGE TIMEOUT ST (SAFETY) ×2 IMPLANT
DRAPE ARTHROSCOPY W/POUCH 114 (DRAPES) ×2 IMPLANT
DRSG PAD ABDOMINAL 8X10 ST (GAUZE/BANDAGES/DRESSINGS) ×2 IMPLANT
DURAPREP 26ML APPLICATOR (WOUND CARE) ×2 IMPLANT
GLOVE BIO SURGEON STRL SZ7.5 (GLOVE) ×2 IMPLANT
GLOVE BIO SURGEON STRL SZ8 (GLOVE) ×2 IMPLANT
GLOVE EUDERMIC 7 POWDERFREE (GLOVE) ×2 IMPLANT
GLOVE SS BIOGEL STRL SZ 7.5 (GLOVE) ×1 IMPLANT
GLOVE SUPERSENSE BIOGEL SZ 7.5 (GLOVE) ×1
GOWN STRL NON-REIN LRG LVL3 (GOWN DISPOSABLE) ×2 IMPLANT
GOWN STRL REIN XL XLG (GOWN DISPOSABLE) ×4 IMPLANT
KIT BASIN OR (CUSTOM PROCEDURE TRAY) ×2 IMPLANT
KIT ROOM TURNOVER OR (KITS) ×2 IMPLANT
MANIFOLD NEPTUNE II (INSTRUMENTS) ×2 IMPLANT
NEEDLE 18GX1X1/2 (RX/OR ONLY) (NEEDLE) ×2 IMPLANT
PACK ARTHROSCOPY DSU (CUSTOM PROCEDURE TRAY) ×2 IMPLANT
PAD ARMBOARD 7.5X6 YLW CONV (MISCELLANEOUS) ×4 IMPLANT
PADDING CAST COTTON 6X4 STRL (CAST SUPPLIES) ×2 IMPLANT
RING FOAM WHITE (MISCELLANEOUS) ×2 IMPLANT
SET ARTHROSCOPY TUBING (MISCELLANEOUS) ×1
SET ARTHROSCOPY TUBING LN (MISCELLANEOUS) ×1 IMPLANT
SPONGE GAUZE 4X4 12PLY (GAUZE/BANDAGES/DRESSINGS) ×2 IMPLANT
SPONGE LAP 4X18 X RAY DECT (DISPOSABLE) ×2 IMPLANT
STRIP CLOSURE SKIN 1/2X4 (GAUZE/BANDAGES/DRESSINGS) ×2 IMPLANT
SYR 30ML LL (SYRINGE) ×2 IMPLANT
TOWEL OR 17X24 6PK STRL BLUE (TOWEL DISPOSABLE) ×2 IMPLANT
WATER STERILE IRR 1000ML POUR (IV SOLUTION) ×2 IMPLANT

## 2012-04-03 NOTE — H&P (Signed)
[name] [name]    Chief Complaint: right knee medial meniscal tear  HPI: The patient is a 71 y.o. male with persistant right medial knee pain refractory to conservative mangement  Past Medical History  Diagnosis Date  . Coronary artery disease     taxes DES to LAD & disease/ Cath 2/07 patents stents with an EF 60%                 t  . Hypertension   . Hyperlipidemia     takes Lipitor and Niacin daily  . SVT (supraventricular tachycardia)     s/p ablation with residual braycardia prohibiting beta blocker  . COPD (chronic obstructive pulmonary disease)     followed by DR Sherene Sires  . Sleep apnea   . Anxiety   . Glucose intolerance (impaired glucose tolerance)   . Osteoarthritis   . Carotid stenosis   . Epistaxis March 2013  . Short-term memory loss   . Long-term memory loss     takes Aricept and Seroquel daily  . Joint pain   . Joint swelling   . Back pain   . Bruises easily     takes Plavix and ASA 81mg  daily  . GERD (gastroesophageal reflux disease)     takes Protonix daily  . Hemorrhoids   . Constipation   . History of colon polyps   . Urinary frequency   . Depression     takes Ativan daily and Lexaprol   . Insomnia     takes Ativan nightly    Past Surgical History  Procedure Date  . Cervical spine surgery 1978    x 2  . Hemorrhoid surgery 1970  . Tonsillectomy and adenoidectomy   . Finger surgery     pointer finger on left hand  . Circumcision     at age 70  . Back surgery   . Cataract surgery     bilateral  . Colonoscopy   . Eeg      with ablation 20+yrs ago  . Cardiac catheterization 2000/01/11  . Coronary angioplasty     2 stents  . Thumb surgery     bilateral  . Polyp removed from nose     Family History  Problem Relation Age of Onset  . Other Mother 56    died old age  . Alzheimer's disease Father 27    died  . Cancer Sister   . Arthritis Sister   . Heart disease Brother     has bypass surgery in his 5's  . Stroke Brother     Social  History:  reports that he has quit smoking. He does not have any smokeless tobacco history on file. He reports that he drinks alcohol. He reports that he does not use illicit drugs.  Allergies: No Known Allergies  Medications Prior to Admission  Medication Sig Dispense Refill  . aspirin 81 MG tablet Take 81 mg by mouth daily.        Marland Kitchen atorvastatin (LIPITOR) 20 MG tablet Take 20 mg by mouth daily.      . clopidogrel (PLAVIX) 75 MG tablet Take 75 mg by mouth daily.        Marland Kitchen donepezil (ARICEPT) 10 MG tablet Take 20 mg by mouth at bedtime.      Marland Kitchen escitalopram (LEXAPRO) 20 MG tablet Take 20 mg by mouth daily.        . fish oil-omega-3 fatty acids 1000 MG capsule Take 2 g by mouth daily.        Marland Kitchen  LORazepam (ATIVAN) 1 MG tablet Take 1 mg by mouth every 8 (eight) hours as needed. For anxiety      . Lysine 500 MG CAPS Take 500 mg by mouth daily.       . niacin 500 MG CR capsule Take 500 mg by mouth at bedtime.      . pantoprazole (PROTONIX) 40 MG tablet Take 40 mg by mouth daily.        . QUEtiapine (SEROQUEL) 50 MG tablet Take 150 mg by mouth at bedtime.      . Tamsulosin HCl (FLOMAX) 0.4 MG CAPS Take 0.4 mg by mouth daily.          Physical Exam: right knee medial joint line pain as nited at recent office visit with MRI showing medial meniscal tear.  Vitals     Assessment/Plan  Impression: right knee medial meniscal tear   Plan of Action: Procedure(s): ARTHROSCOPY KNEE  Dakhari Zuver M 04/03/2012, 9:12 AM

## 2012-04-03 NOTE — Op Note (Signed)
04/03/2012  10:28 AM  PATIENT:   [name] [name]  71 y.o. 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  ASSISTANTS: Shuford pac   ANESTHESIA:   GET + local  EBL: min  SPECIMEN:  none  Drains: none   PATIENT DISPOSITION:  PACU - hemodynamically stable.    PLAN OF CARE: Discharge to home after PACU  Dictation# [id number]

## 2012-04-03 NOTE — Anesthesia Procedure Notes (Signed)
Procedure Name: LMA Insertion Date/Time: 04/03/2012 9:55 AM Performed by: Luster Landsberg Pre-anesthesia Checklist: Patient identified, Emergency Drugs available, Suction available and Patient being monitored Patient Re-evaluated:Patient Re-evaluated prior to inductionOxygen Delivery Method: Circle system utilized Preoxygenation: Pre-oxygenation with 100% oxygen Intubation Type: IV induction Ventilation: Mask ventilation without difficulty LMA: LMA with gastric port inserted LMA Size: 5.0 Tube type: Oral Number of attempts: 1 Placement Confirmation: positive ETCO2 and breath sounds checked- equal and bilateral Tube secured with: Tape Dental Injury: Teeth and Oropharynx as per pre-operative assessment

## 2012-04-03 NOTE — Anesthesia Postprocedure Evaluation (Signed)
Anesthesia Post Note  Patient: [name] [name]  Procedure(s) Performed: Procedure(s) (LRB): ARTHROSCOPY KNEE (Right)  Anesthesia type: MAC  Patient location: PACU  Post pain: Pain level controlled  Post assessment: Patient's Cardiovascular Status Stable  Last Vitals:  Filed Vitals:   04/03/12 1115  BP: 115/56  Pulse: 52  Temp: 36.3 C  Resp: 14    Post vital signs: Reviewed and stable  Level of consciousness: sedated  Complications: No apparent anesthesia complications

## 2012-04-03 NOTE — Addendum Note (Signed)
Addendum  created 04/03/12 1212 by Luster Landsberg, CRNA   Modules edited:Anesthesia Blocks and Procedures, Inpatient Notes

## 2012-04-03 NOTE — Transfer of Care (Signed)
Immediate Anesthesia Transfer of Care Note  Patient: [name] [name]  Procedure(s) Performed: Procedure(s) (LRB): ARTHROSCOPY KNEE (Right)  Patient Location: PACU  Anesthesia Type: General  Level of Consciousness: awake and alert   Airway & Oxygen Therapy: Patient Spontanous Breathing and Patient connected to nasal cannula oxygen  Post-op Assessment: Report given to PACU RN, Post -op Vital signs reviewed and stable and Patient moving all extremities  Post vital signs: Reviewed and stable  Complications: No apparent anesthesia complications

## 2012-04-03 NOTE — Anesthesia Preprocedure Evaluation (Addendum)
Anesthesia Evaluation  Patient identified by MRN, date of birth, ID band Patient awake    Reviewed: Allergy & Precautions, H&P , NPO status , Patient's Chart, lab work & pertinent test results  History of Anesthesia Complications Negative for: history of anesthetic complications  Airway Mallampati: I TM Distance: >3 FB Neck ROM: Full    Dental  (+) Edentulous Upper and Edentulous Lower   Pulmonary sleep apnea (no CPAP) , former smoker,  breath sounds clear to auscultation  Pulmonary exam normal       Cardiovascular hypertension (no meds presently), + CAD and + Cardiac Stents (stent x 2, on plavix) + dysrhythmias (s/p ablation) Supra Ventricular Tachycardia Rhythm:Regular Rate:Normal  EF 60% '09   Neuro/Psych negative neurological ROS     GI/Hepatic Neg liver ROS, GERD-  Medicated and Controlled,  Endo/Other  negative endocrine ROS  Renal/GU negative Renal ROS     Musculoskeletal   Abdominal   Peds  Hematology negative hematology ROS (+)   Anesthesia Other Findings   Reproductive/Obstetrics                           Anesthesia Physical Anesthesia Plan  ASA: III  Anesthesia Plan: General   Post-op Pain Management:    Induction: Intravenous  Airway Management Planned: LMA  Additional Equipment:   Intra-op Plan:   Post-operative Plan:   Informed Consent: I have reviewed the patients History and Physical, chart, labs and discussed the procedure including the risks, benefits and alternatives for the proposed anesthesia with the patient or authorized representative who has indicated his/her understanding and acceptance.     Plan Discussed with: CRNA and Surgeon  Anesthesia Plan Comments: (Plan routine monitors, GA- LMA OK)        Anesthesia Quick Evaluation

## 2012-04-03 NOTE — Progress Notes (Signed)
Orthopedic Tech Progress Note Patient Details:  [name] [name] 07-22-1941 [id number]  Ortho Devices Type of Ortho Device: Crutches   [name] 04/03/2012, 11:56 AM

## 2012-04-03 NOTE — Addendum Note (Signed)
Addendum  created 04/03/12 1212 by Tasia Liz R Yifan Auker, CRNA   Modules edited:Anesthesia Blocks and Procedures, Inpatient Notes    

## 2012-04-04 ENCOUNTER — Encounter (HOSPITAL_COMMUNITY): Payer: Self-pay | Admitting: Orthopedic Surgery

## 2012-04-04 NOTE — Op Note (Signed)
NAME:  [name] [name], ISOLA.:  [id number]  MEDICAL RECORD NO.:  [id number]  LOCATION:  [facility]                         FACILITY:  [facility]  PHYSICIAN:  Vania Rea. Theus Espin, M.D.  DATE OF BIRTH:  01/11/1941  DATE OF PROCEDURE:  04/03/2012 DATE OF DISCHARGE:  04/03/2012                              OPERATIVE REPORT   PREOPERATIVE DIAGNOSES: 1. Chronic right medial knee pain with MRI evidence for medial     meniscus tear. 2. Right knee early arthrosis.  POSTOPERATIVE DIAGNOSES: 1. Right knee medial meniscus tear. 2. Right knee lateral meniscus tear. 3. 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 4. Medial compartment chondromalacia. 5. Multiple loose bodies. 6. Diffuse synovitis.  PROCEDURE: 1. Right knee diagnostic arthroscopy. 2. Limited synovectomy. 3. Removal of multiple cartilaginous loose bodies. 4. Chondroplasty of the patellofemoral joint and medial compartment. 5. Partial medial and partial lateral meniscectomies.  SURGEON:  Vania Rea. Raylynn Hersh, MD  ASSISTANT:  Lucita Lora. Shuford, PA-C  ANESTHESIA:  General endotracheal as well as local.  BLOOD LOSS:  Minimal.  DRAINS:  None.  TOURNIQUET:  None.  HISTORY:  [name] [name] is a 71 year old gentleman, who has had persistent right medial knee pain and mechanical symptoms with plain radiographs demonstrating relatively good preservation of joint spaces but showing some early degenerative changes, particularly in the medial patellofemoral compartments.  Preoperative MRI scan shows a large displaced tear at the medial meniscus.  Due to his ongoing pain and mechanical symptoms, he is brought to the operating room at this time for planned right knee arthroscopy as described below.  I briefly counseled Ms. Wilner on treatment options as well as risks versus benefits thereof.  Possible surgical complications were reviewed including potential for bleeding, infection, neurovascular injury, DVT, PE, as  well as persistent pain.  We also discussed that arthroscopic surgery would not change any underlying osteoarthrosis.  He understands and accepts and agrees to planned procedure.  PROCEDURE IN DETAIL:  After undergoing routine preoperative evaluation, the patient received prophylactic antibiotics.  Brought to the operating room, placed supine on the operative table, underwent smooth induction of a general endotracheal anesthesia.  Right leg was placed in a leg holder and sterilely prepped and draped in standard fashion.  Time-out was called.  Standard arthroscopy portals were established into the right knee and diagnostic arthroscopy was performed.  The suprapatellar pouch showed multiple cartilaginous loose bodies, most of which were loose but several of which were attached to the synovial lining.  We introduced a shaver and used this to evacuate the loose bodies and debride the cartilage fragments which were only loosely adherent to the synovial lining.  Gutters were cleared of loose bodies as well. Synovectomy performed in the anterior chamber.  Patellofemoral joint showed broad grade 2 and 3 chondromalacia of the patella and variety of exposed frontal bone over the distal third of the trochlear groove and all loose cartilage flaps were debrided from the patella and trochlear groove with shaver.  The intercondylar notch showed the ACL to be grossly intact with some remaining osteophytes noted.  Medially there was a complex and extensive tear of the majority of the medial meniscus, particularly posterior 2/3rd  with large pedunculated fragments that were extracted into the joint and then the meniscus was trimmed with basket back to stable margin.  Collene Mares was used for final contouring and removal of meniscal fragments.  In addition, there was grade 3 and 4 chondromalacia with an area of exposed subchondral bone both on the medial aspect of the medial tibial plateau as well as medial  femoral condyle and a shaver was introduced to perform a chondroplasty removing loose cartilage fragments and smoothing the residual cartilage down to a smooth contour.  Laterally there was a small degenerative tear of the anterior horn in mid third.  Lateral meniscus was trimmed with shaver back to stable margin.  Also removed number of cartilaginous loose bodies from the posterior medial and posterolateral compartments.  At this point, final inspection and irrigation was then completed.  Fluid and instruments removed.  Combination of Marcaine and morphine instilled in knee joint.  Additional Marcaine about the portals.  Portals closed with Steri-Strips.  A dry dressing was then wrapped about the right knee.  Right leg was wrapped in Ace bandage.  Thigh-high support stocking.  The patient was awakened, extubated, and transferred to recovery room in stable condition.     Vania Rea. Maddyx Wieck, M.D.     KMS/MEDQ  D:  04/03/2012  T:  04/04/2012  Job:  [id number]

## 2012-09-24 NOTE — Progress Notes (Signed)
NEED PRE OP ORDERS PLEASE-  Has PST appt 09/30/12  [facility]

## 2012-09-26 ENCOUNTER — Encounter (HOSPITAL_COMMUNITY): Payer: Self-pay | Admitting: Pharmacy Technician

## 2012-09-29 NOTE — Patient Instructions (Addendum)
[name] [name]  09/29/2012   Your procedure is scheduled on:10/07/12     Report to [facility] at    0715 AM.  Call this number if you have problems the morning of surgery: [phone]   Remember:   Do not eat food or drink liquids after midnight.   Take these medicines the morning of surgery with A SIP OF WATER:    Do not wear jewelry,   Do not wear lotions, powders, or perfumes.    Men may shave face and neck.  Do not bring valuables to the hospital.  Contacts, dentures or bridgework may not be worn into surgery.  Leave suitcase in the car. After surgery it may be brought to your room.  For patients admitted to the hospital, checkout time is 11:00 AM the day of  discharge.      SEE CHG INSTRUCTION SHEET    Please read over the following fact sheets that you were given: MRSA Information, coughing and deep breathing exercises, leg exercises, Incentive Spirometry Fact sheet, Blood Transfusion Fact Sheet                Failure to comply with these instructions may result in cancellation of your surgery.                Patient Signature ____________________________              Nurse Signature _____________________________

## 2012-09-30 ENCOUNTER — Encounter (HOSPITAL_COMMUNITY): Payer: Self-pay

## 2012-09-30 ENCOUNTER — Encounter (HOSPITAL_COMMUNITY)
Admission: RE | Admit: 2012-09-30 | Discharge: 2012-09-30 | Disposition: A | Discharge status: Home / Self Care | Payer: Medicare Other | Source: Ambulatory Visit | Attending: Orthopedic Surgery | Admitting: Orthopedic Surgery

## 2012-09-30 HISTORY — DX: Epistaxis: R04.0

## 2012-09-30 HISTORY — DX: Acute myocardial infarction, unspecified: I21.9

## 2012-09-30 HISTORY — DX: Unspecified dementia, unspecified severity, without behavioral disturbance, psychotic disturbance, mood disturbance, and anxiety: F03.90

## 2012-09-30 LAB — CBC
HCT: 42.6 % (ref 39.0–52.0)
MCH: 28 pg (ref 26.0–34.0)
MCHC: 32.6 g/dL (ref 30.0–36.0)
MCV: 85.9 fL (ref 78.0–100.0)
Platelets: 168 10*3/uL (ref 150–400)
RBC: 4.96 MIL/uL (ref 4.22–5.81)
RDW: 14.6 % (ref 11.5–15.5)
WBC: 4.7 10*3/uL (ref 4.0–10.5)

## 2012-09-30 LAB — URINALYSIS, ROUTINE W REFLEX MICROSCOPIC
Bilirubin Urine: NEGATIVE
Hgb urine dipstick: NEGATIVE
Ketones, ur: NEGATIVE mg/dL
Specific Gravity, Urine: 1.019 (ref 1.005–1.030)
Urobilinogen, UA: 0.2 mg/dL (ref 0.0–1.0)
pH: 6.5 (ref 5.0–8.0)

## 2012-09-30 LAB — SURGICAL PCR SCREEN: Staphylococcus aureus: NEGATIVE

## 2012-09-30 LAB — BASIC METABOLIC PANEL
GFR calc Af Amer: 75 mL/min — ABNORMAL LOW (ref >90)
GFR calc non Af Amer: 64 mL/min — ABNORMAL LOW (ref >90)
Potassium: 4.4 mEq/L (ref 3.5–5.1)
Sodium: 138 mEq/L (ref 135–145)

## 2012-09-30 LAB — PROTIME-INR: Prothrombin Time: 13.2 seconds (ref 11.6–15.2)

## 2012-09-30 LAB — ABO/RH: ABO/RH(D): O NEG

## 2012-09-30 NOTE — Progress Notes (Signed)
Requested last office visit note from Dr Marlena Clipper, neurologist in [facility].   Requested from Dr Andrey Campanile, ENT in [place] last office visit note related to sinus infection.   Requested last office visit note from Dr Charm Barges, PCP.

## 2012-09-30 NOTE — Progress Notes (Addendum)
Treadmill 11/06/11 [facility]  Last office visit with Dr Nicholes Mango 12/04/10 [facility]  EKG 11/05/11 [facility]  04/01/12 EKG [facility]

## 2012-09-30 NOTE — Progress Notes (Signed)
Last office visit note with DR Josephina Shih, ENT dated 09/15/12 on chart

## 2012-10-01 ENCOUNTER — Encounter (HOSPITAL_COMMUNITY): Payer: Self-pay

## 2012-10-01 NOTE — Progress Notes (Signed)
Last office visit note from Dr Charm Barges, PCP dated 09/11/12 on chart

## 2012-10-01 NOTE — Progress Notes (Signed)
Office visit notes from Dr Marlena Clipper, neurology, placed on chart from 06/25/12, 12/19/11, adn 08/29/12.

## 2012-10-06 ENCOUNTER — Encounter (HOSPITAL_COMMUNITY): Payer: Self-pay | Admitting: *Deleted

## 2012-10-06 NOTE — H&P (Signed)
TOTAL KNEE ADMISSION H&P  Patient is being admitted for right total knee arthroplasty.  Subjective:  Chief Complaint:   right knee OA / pain.  HPI: [name] [name], 72 y.o. male, has a history of pain and functional disability in the right knee due to arthritis and has failed non-surgical conservative treatments for greater than 12 weeks to includeNSAID's and/or analgesics, corticosteriod injections, use of assistive devices and activity modification.  Onset of symptoms was gradual, starting 2 years ago with gradually worsening course since that time. The patient noted prior procedures on the knee to include  arthroscopy on the right knee(s).  Patient currently rates pain in the right knee(s) at 9 out of 10 with activity. Patient has night pain, worsening of pain with activity and weight bearing, pain that interferes with activities of daily living, pain with passive range of motion, crepitus and joint swelling.  Patient has evidence of periarticular osteophytes and joint space narrowing by imaging studies. There is no active infection.  Risks, benefits and expectations were discussed with the patient. Patient understand the risks, benefits and expectations and wishes to proceed with surgery.   D/C Plans:   Home with HHPT  Post-op Meds:   No Rx given  Tranexamic Acid:   Not to be given - Previous MI  Decadron:    To be given  FYI:    Will go back on Plavix and ASA post surgery   Patient Active Problem List   Diagnosis Date Noted  . Occlusion and stenosis of carotid artery 12/04/2010  . HYPERLIPIDEMIA 11/10/2008  . HYPERTENSION, BENIGN 11/10/2008  . CAD, NATIVE VESSEL 11/10/2008   Past Medical History  Diagnosis Date  . Coronary artery disease     taxes DES to LAD & disease/ Cath 2/07 patents stents with an EF 60%                 t  . Hypertension   . Hyperlipidemia     takes Lipitor and Niacin daily  . SVT (supraventricular tachycardia)     s/p ablation with residual braycardia  prohibiting beta blocker  . Sleep apnea   . Anxiety   . Glucose intolerance (impaired glucose tolerance)   . Osteoarthritis   . Carotid stenosis   . Epistaxis March 2013  . Short-term memory loss   . Long-term memory loss     takes Aricept and Seroquel daily  . Joint pain   . Joint swelling   . Back pain   . Bruises easily     takes Plavix and ASA 81mg  daily  . GERD (gastroesophageal reflux disease)     takes Protonix daily  . Hemorrhoids   . Constipation   . History of colon polyps   . Urinary frequency   . Depression     takes Ativan daily and Lexaprol   . Insomnia     takes Ativan nightly  . Myocardial infarction     unsure of date   . COPD (chronic obstructive pulmonary disease)     followed by DR Jorene Guest seen in years   . Cancer     skin cancer removed from back  . Nosebleed     recent 09/15/12 office visit with Dr Josephina Shih , ENT   . Headache     sinus headaches recently , hx of migraines   . Dementia     per note of 06/25/12- Dr Daphane Shepherd- neurology    Past Surgical History  Procedure Laterality Date  .  Cervical spine surgery  1978    x 2  . Hemorrhoid surgery  1970  . Tonsillectomy and adenoidectomy    . Finger surgery      pointer finger on left hand  . Circumcision      at age 42  . Back surgery    . Cataract surgery      bilateral  . Colonoscopy    . Eeg       with ablation 20+yrs ago  . Cardiac catheterization  2000/01/11  . Coronary angioplasty      2 stents  . Thumb surgery      bilateral  . Polyp removed from nose    . Knee arthroscopy  04/03/2012    Procedure: ARTHROSCOPY KNEE;  Surgeon: Senaida Lange, MD;  Location: [facility] OR;  Service: Orthopedics;  Laterality: Right;  Right Knee Arthroscopy with Debridement   . Eye surgery      No prescriptions prior to admission   No Known Allergies  History  Substance Use Topics  . Smoking status: Former Smoker    Quit date: 08/07/1999  . Smokeless tobacco: Never Used     Comment: quit 86yrs ago   . Alcohol Use: No    Family History  Problem Relation Age of Onset  . Other Mother 39    died old age  . Alzheimer's disease Father 65    died  . Cancer Sister   . Arthritis Sister   . Heart disease Brother     has bypass surgery in his 51's  . Stroke Brother      Review of Systems  Constitutional: Negative.   HENT: Negative.   Eyes: Negative.   Respiratory: Negative.   Cardiovascular: Negative.   Gastrointestinal: Negative.   Genitourinary: Negative.   Musculoskeletal: Positive for joint pain.  Skin: Negative.   Neurological: Negative.   Endo/Heme/Allergies: Negative.   Psychiatric/Behavioral: Negative.     Objective:  Physical Exam  Constitutional: He is oriented to person, place, and time. He appears well-developed and well-nourished.  HENT:  Head: Normocephalic and atraumatic.  Mouth/Throat: Oropharynx is clear and moist.  Eyes: Pupils are equal, round, and reactive to light.  Neck: Neck supple. No JVD present. No tracheal deviation present. No thyromegaly present.  Cardiovascular: Normal rate, regular rhythm and intact distal pulses.   Respiratory: Effort normal and breath sounds normal. No respiratory distress. He has no wheezes.  GI: Soft. There is no tenderness. There is no guarding.  Musculoskeletal:       Right knee: He exhibits decreased range of motion, swelling and bony tenderness. He exhibits no effusion, no ecchymosis, no deformity and no erythema. Tenderness found.  Lymphadenopathy:    He has no cervical adenopathy.  Neurological: He is alert and oriented to person, place, and time.  Skin: Skin is warm and dry.  Psychiatric: He has a normal mood and affect.    Labs:  Estimated body mass index is 24.81 kg/(m^2) as calculated from the following:   Height as of 04/01/12: 5\' 8"  (1.727 m).   Weight as of 04/01/12: 73.993 kg (163 lb 2 oz).   Imaging Review Plain radiographs demonstrate severe degenerative joint disease of the right knee(s). The  overall alignment isneutral. The bone quality appears to be good for age and reported activity level.  Assessment/Plan:  End stage arthritis, right knee   The patient history, physical examination, clinical judgment of the provider and imaging studies are consistent with end stage degenerative joint disease of  the right knee(s) and total knee arthroplasty is deemed medically necessary. The treatment options including medical management, injection therapy arthroscopy and arthroplasty were discussed at length. The risks and benefits of total knee arthroplasty were presented and reviewed. The risks due to aseptic loosening, infection, stiffness, patella tracking problems, thromboembolic complications and other imponderables were discussed. The patient acknowledged the explanation, agreed to proceed with the plan and consent was signed. Patient is being admitted for inpatient treatment for surgery, pain control, PT, OT, prophylactic antibiotics, VTE prophylaxis, progressive ambulation and ADL's and discharge planning. The patient is planning to be discharged home with home health services.    Anastasio Auerbach Babish   PAC  10/06/2012, 10:09 PM

## 2012-10-07 ENCOUNTER — Encounter (HOSPITAL_COMMUNITY): Payer: Self-pay | Admitting: *Deleted

## 2012-10-07 ENCOUNTER — Inpatient Hospital Stay (HOSPITAL_COMMUNITY)
Admission: RE | Admit: 2012-10-07 | Discharge: 2012-10-10 | DRG: 470 | Disposition: A | Discharge status: Skilled Nursing Facility | Payer: Medicare Other | Source: Ambulatory Visit | Attending: Orthopedic Surgery | Admitting: Orthopedic Surgery

## 2012-10-07 ENCOUNTER — Encounter (HOSPITAL_COMMUNITY): Payer: Self-pay | Admitting: Anesthesiology

## 2012-10-07 ENCOUNTER — Encounter (HOSPITAL_COMMUNITY)
Admission: RE | Disposition: A | Discharge status: Skilled Nursing Facility | Payer: Self-pay | Source: Ambulatory Visit | Attending: Orthopedic Surgery

## 2012-10-07 ENCOUNTER — Inpatient Hospital Stay (HOSPITAL_COMMUNITY): Payer: Medicare Other | Admitting: Anesthesiology

## 2012-10-07 DIAGNOSIS — Z01812 Encounter for preprocedural laboratory examination: Secondary | ICD-10-CM

## 2012-10-07 DIAGNOSIS — Z96651 Presence of right artificial knee joint: Secondary | ICD-10-CM

## 2012-10-07 DIAGNOSIS — Y9210 Unspecified residential institution as the place of occurrence of the external cause: Secondary | ICD-10-CM | POA: Diagnosis not present

## 2012-10-07 DIAGNOSIS — W010XXA Fall on same level from slipping, tripping and stumbling without subsequent striking against object, initial encounter: Secondary | ICD-10-CM | POA: Diagnosis not present

## 2012-10-07 DIAGNOSIS — K219 Gastro-esophageal reflux disease without esophagitis: Secondary | ICD-10-CM | POA: Diagnosis present

## 2012-10-07 DIAGNOSIS — I739 Peripheral vascular disease, unspecified: Secondary | ICD-10-CM | POA: Diagnosis present

## 2012-10-07 DIAGNOSIS — D62 Acute posthemorrhagic anemia: Secondary | ICD-10-CM | POA: Diagnosis not present

## 2012-10-07 DIAGNOSIS — M6580 Other synovitis and tenosynovitis, unspecified site: Secondary | ICD-10-CM | POA: Diagnosis present

## 2012-10-07 DIAGNOSIS — I498 Other specified cardiac arrhythmias: Secondary | ICD-10-CM | POA: Diagnosis present

## 2012-10-07 DIAGNOSIS — M1710 Unilateral primary osteoarthritis, unspecified knee: Principal | ICD-10-CM | POA: Diagnosis present

## 2012-10-07 DIAGNOSIS — I2510 Atherosclerotic heart disease of native coronary artery without angina pectoris: Secondary | ICD-10-CM | POA: Diagnosis present

## 2012-10-07 DIAGNOSIS — D500 Iron deficiency anemia secondary to blood loss (chronic): Secondary | ICD-10-CM

## 2012-10-07 DIAGNOSIS — F3289 Other specified depressive episodes: Secondary | ICD-10-CM | POA: Diagnosis present

## 2012-10-07 DIAGNOSIS — F411 Generalized anxiety disorder: Secondary | ICD-10-CM | POA: Diagnosis present

## 2012-10-07 DIAGNOSIS — J4489 Other specified chronic obstructive pulmonary disease: Secondary | ICD-10-CM | POA: Diagnosis present

## 2012-10-07 DIAGNOSIS — IMO0002 Reserved for concepts with insufficient information to code with codable children: Secondary | ICD-10-CM | POA: Diagnosis not present

## 2012-10-07 DIAGNOSIS — I252 Old myocardial infarction: Secondary | ICD-10-CM

## 2012-10-07 DIAGNOSIS — Z96659 Presence of unspecified artificial knee joint: Secondary | ICD-10-CM

## 2012-10-07 DIAGNOSIS — G4730 Sleep apnea, unspecified: Secondary | ICD-10-CM | POA: Diagnosis present

## 2012-10-07 DIAGNOSIS — J449 Chronic obstructive pulmonary disease, unspecified: Secondary | ICD-10-CM | POA: Diagnosis present

## 2012-10-07 DIAGNOSIS — F329 Major depressive disorder, single episode, unspecified: Secondary | ICD-10-CM | POA: Diagnosis present

## 2012-10-07 HISTORY — PX: TOTAL KNEE ARTHROPLASTY: SHX125

## 2012-10-07 LAB — TYPE AND SCREEN: ABO/RH(D): O NEG

## 2012-10-07 SURGERY — ARTHROPLASTY, KNEE, TOTAL
Anesthesia: General | Site: Knee | Laterality: Right | Wound class: Clean

## 2012-10-07 MED ORDER — LORAZEPAM 1 MG PO TABS
1.0000 mg | ORAL_TABLET | Freq: Every day | ORAL | Status: DC
  Administered 2012-10-08 – 2012-10-10 (×3): 1 mg via ORAL
  Filled 2012-10-07 (×3): qty 1

## 2012-10-07 MED ORDER — METOCLOPRAMIDE HCL 5 MG/ML IJ SOLN: 5.0000 mg | Freq: Three times a day (TID) | INTRAMUSCULAR | Status: DC | PRN

## 2012-10-07 MED ORDER — ACETAMINOPHEN 325 MG PO TABS
650.0000 mg | ORAL_TABLET | Freq: Four times a day (QID) | ORAL | Status: DC | PRN
  Administered 2012-10-09 – 2012-10-10 (×3): 650 mg via ORAL
  Filled 2012-10-07 (×3): qty 2

## 2012-10-07 MED ORDER — LABETALOL HCL 5 MG/ML IV SOLN
2.5000 mg | Freq: Once | INTRAVENOUS | Status: AC
  Administered 2012-10-07: 2.5 mg via INTRAVENOUS

## 2012-10-07 MED ORDER — CEFAZOLIN SODIUM 1-5 GM-% IV SOLN
1.0000 g | Freq: Four times a day (QID) | INTRAVENOUS | Status: AC
  Administered 2012-10-07 (×2): 1 g via INTRAVENOUS
  Filled 2012-10-07 (×2): qty 50

## 2012-10-07 MED ORDER — ROCURONIUM BROMIDE 100 MG/10ML IV SOLN
INTRAVENOUS | Status: DC | PRN
  Administered 2012-10-07: 40 mg via INTRAVENOUS

## 2012-10-07 MED ORDER — TAMSULOSIN HCL 0.4 MG PO CAPS
0.4000 mg | ORAL_CAPSULE | Freq: Every day | ORAL | Status: DC
  Administered 2012-10-07 – 2012-10-09 (×3): 0.4 mg via ORAL
  Filled 2012-10-07 (×4): qty 1

## 2012-10-07 MED ORDER — ONDANSETRON HCL 4 MG PO TABS: 4.0000 mg | ORAL_TABLET | Freq: Four times a day (QID) | ORAL | Status: DC | PRN

## 2012-10-07 MED ORDER — CLOPIDOGREL BISULFATE 75 MG PO TABS
75.0000 mg | ORAL_TABLET | Freq: Every day | ORAL | Status: DC
  Administered 2012-10-08 – 2012-10-10 (×3): 75 mg via ORAL
  Filled 2012-10-07 (×4): qty 1

## 2012-10-07 MED ORDER — NEOSTIGMINE METHYLSULFATE 1 MG/ML IJ SOLN
INTRAMUSCULAR | Status: DC | PRN
  Administered 2012-10-07: 4 mg via INTRAVENOUS

## 2012-10-07 MED ORDER — CHLORHEXIDINE GLUCONATE 4 % EX LIQD: 60.0000 mL | Freq: Once | CUTANEOUS | Status: DC

## 2012-10-07 MED ORDER — PROMETHAZINE HCL 25 MG/ML IJ SOLN: 6.2500 mg | INTRAMUSCULAR | Status: DC | PRN

## 2012-10-07 MED ORDER — GLYCOPYRROLATE 0.2 MG/ML IJ SOLN
INTRAMUSCULAR | Status: DC | PRN
  Administered 2012-10-07: .8 mg via INTRAVENOUS

## 2012-10-07 MED ORDER — HYDROMORPHONE HCL PF 1 MG/ML IJ SOLN
0.2500 mg | INTRAMUSCULAR | Status: DC | PRN
  Administered 2012-10-07 (×4): 0.5 mg via INTRAVENOUS

## 2012-10-07 MED ORDER — DEXAMETHASONE SODIUM PHOSPHATE 10 MG/ML IJ SOLN: 10.0000 mg | Freq: Once | INTRAMUSCULAR | Status: DC

## 2012-10-07 MED ORDER — PHENOL 1.4 % MT LIQD: 1.0000 | OROMUCOSAL | Status: DC | PRN

## 2012-10-07 MED ORDER — 0.9 % SODIUM CHLORIDE (POUR BTL) OPTIME
TOPICAL | Status: DC | PRN
  Administered 2012-10-07: 1000 mL

## 2012-10-07 MED ORDER — MONTELUKAST SODIUM 10 MG PO TABS
10.0000 mg | ORAL_TABLET | Freq: Every morning | ORAL | Status: DC
  Administered 2012-10-08 – 2012-10-10 (×3): 10 mg via ORAL
  Filled 2012-10-07 (×3): qty 1

## 2012-10-07 MED ORDER — STERILE WATER FOR IRRIGATION IR SOLN
Status: DC | PRN
  Administered 2012-10-07: 3000 mL

## 2012-10-07 MED ORDER — ONDANSETRON HCL 4 MG/2ML IJ SOLN
INTRAMUSCULAR | Status: DC | PRN
  Administered 2012-10-07: 4 mg via INTRAVENOUS

## 2012-10-07 MED ORDER — QUETIAPINE FUMARATE 50 MG PO TABS
150.0000 mg | ORAL_TABLET | Freq: Every day | ORAL | Status: DC
  Administered 2012-10-07 – 2012-10-09 (×3): 150 mg via ORAL
  Filled 2012-10-07 (×4): qty 1

## 2012-10-07 MED ORDER — ATORVASTATIN CALCIUM 20 MG PO TABS
20.0000 mg | ORAL_TABLET | Freq: Every day | ORAL | Status: DC
  Administered 2012-10-07 – 2012-10-09 (×3): 20 mg via ORAL
  Filled 2012-10-07 (×4): qty 1

## 2012-10-07 MED ORDER — POLYETHYLENE GLYCOL 3350 17 G PO PACK
17.0000 g | PACK | Freq: Two times a day (BID) | ORAL | Status: DC
  Administered 2012-10-07 – 2012-10-10 (×6): 17 g via ORAL

## 2012-10-07 MED ORDER — LORAZEPAM 1 MG PO TABS
2.0000 mg | ORAL_TABLET | Freq: Every day | ORAL | Status: DC
  Administered 2012-10-07 – 2012-10-09 (×3): 2 mg via ORAL
  Filled 2012-10-07 (×4): qty 2

## 2012-10-07 MED ORDER — ALUM & MAG HYDROXIDE-SIMETH 200-200-20 MG/5ML PO SUSP: 30.0000 mL | ORAL | Status: DC | PRN

## 2012-10-07 MED ORDER — MELATONIN 5 MG PO TABS: 10.0000 mg | ORAL_TABLET | Freq: Every day | ORAL | Status: DC

## 2012-10-07 MED ORDER — METHOCARBAMOL 100 MG/ML IJ SOLN: 500.0000 mg | Freq: Four times a day (QID) | INTRAVENOUS | Status: DC | PRN

## 2012-10-07 MED ORDER — SODIUM CHLORIDE 0.9 % IV SOLN
INTRAVENOUS | Status: DC
  Administered 2012-10-07 – 2012-10-08 (×2): via INTRAVENOUS
  Filled 2012-10-07 (×8): qty 1000

## 2012-10-07 MED ORDER — SALINE NASAL SPRAY 0.65 % NA SOLN: 1.0000 | Freq: Every day | NASAL | Status: DC | PRN

## 2012-10-07 MED ORDER — HYDROCODONE-ACETAMINOPHEN 7.5-325 MG PO TABS
1.0000 | ORAL_TABLET | Freq: Four times a day (QID) | ORAL | Status: DC
  Administered 2012-10-07: 1 via ORAL
  Administered 2012-10-07: 2 via ORAL
  Administered 2012-10-07: 1 via ORAL
  Administered 2012-10-08 (×2): 2 via ORAL
  Filled 2012-10-07: qty 2
  Filled 2012-10-07: qty 1
  Filled 2012-10-07 (×2): qty 2
  Filled 2012-10-07: qty 1

## 2012-10-07 MED ORDER — CEFAZOLIN SODIUM-DEXTROSE 2-3 GM-% IV SOLR
2.0000 g | INTRAVENOUS | Status: AC
  Administered 2012-10-07: 2 g via INTRAVENOUS

## 2012-10-07 MED ORDER — NIACIN ER 500 MG PO CPCR
500.0000 mg | ORAL_CAPSULE | Freq: Every day | ORAL | Status: DC
  Administered 2012-10-07 – 2012-10-09 (×3): 500 mg via ORAL
  Filled 2012-10-07 (×4): qty 1

## 2012-10-07 MED ORDER — FERROUS SULFATE 325 (65 FE) MG PO TABS
325.0000 mg | ORAL_TABLET | Freq: Three times a day (TID) | ORAL | Status: DC
  Administered 2012-10-07 – 2012-10-10 (×8): 325 mg via ORAL
  Filled 2012-10-07 (×11): qty 1

## 2012-10-07 MED ORDER — ASPIRIN 81 MG PO TABS: 81.0000 mg | ORAL_TABLET | Freq: Every morning | ORAL | Status: DC

## 2012-10-07 MED ORDER — BUPIVACAINE LIPOSOME 1.3 % IJ SUSP
20.0000 mL | Freq: Once | INTRAMUSCULAR | Status: AC
  Administered 2012-10-07: 20 mL
  Filled 2012-10-07: qty 20

## 2012-10-07 MED ORDER — ROPIVACAINE HCL 5 MG/ML IJ SOLN
INTRAMUSCULAR | Status: DC | PRN
  Administered 2012-10-07: 30 mL

## 2012-10-07 MED ORDER — PANTOPRAZOLE SODIUM 40 MG PO TBEC
40.0000 mg | DELAYED_RELEASE_TABLET | Freq: Every day | ORAL | Status: DC
  Administered 2012-10-08 – 2012-10-10 (×3): 40 mg via ORAL
  Filled 2012-10-07 (×3): qty 1

## 2012-10-07 MED ORDER — DOCUSATE SODIUM 100 MG PO CAPS
100.0000 mg | ORAL_CAPSULE | Freq: Two times a day (BID) | ORAL | Status: DC
  Administered 2012-10-07 – 2012-10-10 (×7): 100 mg via ORAL

## 2012-10-07 MED ORDER — OXYBUTYNIN CHLORIDE 5 MG PO TABS
5.0000 mg | ORAL_TABLET | Freq: Every day | ORAL | Status: DC
  Administered 2012-10-08 – 2012-10-10 (×3): 5 mg via ORAL
  Filled 2012-10-07 (×3): qty 1

## 2012-10-07 MED ORDER — HYDROMORPHONE HCL PF 1 MG/ML IJ SOLN
0.5000 mg | INTRAMUSCULAR | Status: DC | PRN
  Administered 2012-10-07: 1 mg via INTRAVENOUS
  Filled 2012-10-07: qty 1

## 2012-10-07 MED ORDER — DONEPEZIL HCL 10 MG PO TABS
20.0000 mg | ORAL_TABLET | Freq: Every morning | ORAL | Status: DC
  Administered 2012-10-08 – 2012-10-10 (×3): 20 mg via ORAL
  Filled 2012-10-07 (×3): qty 2

## 2012-10-07 MED ORDER — FENTANYL CITRATE 0.05 MG/ML IJ SOLN
INTRAMUSCULAR | Status: DC | PRN
  Administered 2012-10-07: 25 ug via INTRAVENOUS
  Administered 2012-10-07: 50 ug via INTRAVENOUS
  Administered 2012-10-07: 25 ug via INTRAVENOUS
  Administered 2012-10-07: 75 ug via INTRAVENOUS
  Administered 2012-10-07: 25 ug via INTRAVENOUS

## 2012-10-07 MED ORDER — PROPOFOL 10 MG/ML IV BOLUS
INTRAVENOUS | Status: DC | PRN
  Administered 2012-10-07: 150 mg via INTRAVENOUS

## 2012-10-07 MED ORDER — ESCITALOPRAM OXALATE 20 MG PO TABS
20.0000 mg | ORAL_TABLET | Freq: Every morning | ORAL | Status: DC
  Administered 2012-10-08 – 2012-10-10 (×3): 20 mg via ORAL
  Filled 2012-10-07 (×3): qty 1

## 2012-10-07 MED ORDER — DEXAMETHASONE SODIUM PHOSPHATE 10 MG/ML IJ SOLN
10.0000 mg | Freq: Once | INTRAMUSCULAR | Status: AC
  Administered 2012-10-08: 10 mg via INTRAVENOUS
  Filled 2012-10-07: qty 1

## 2012-10-07 MED ORDER — DOCUSATE SODIUM 100 MG PO CAPS: 100.0000 mg | ORAL_CAPSULE | Freq: Two times a day (BID) | ORAL | Status: DC

## 2012-10-07 MED ORDER — ACETAMINOPHEN 10 MG/ML IV SOLN
INTRAVENOUS | Status: DC | PRN
  Administered 2012-10-07: 1000 mg via INTRAVENOUS

## 2012-10-07 MED ORDER — ONDANSETRON HCL 4 MG/2ML IJ SOLN: 4.0000 mg | Freq: Four times a day (QID) | INTRAMUSCULAR | Status: DC | PRN

## 2012-10-07 MED ORDER — LACTATED RINGERS IV SOLN
INTRAVENOUS | Status: DC
  Administered 2012-10-07: 12:00:00 via INTRAVENOUS

## 2012-10-07 MED ORDER — METHOCARBAMOL 500 MG PO TABS
500.0000 mg | ORAL_TABLET | Freq: Four times a day (QID) | ORAL | Status: DC | PRN
  Administered 2012-10-07 – 2012-10-08 (×3): 500 mg via ORAL
  Filled 2012-10-07 (×3): qty 1

## 2012-10-07 MED ORDER — LIDOCAINE HCL (CARDIAC) 20 MG/ML IV SOLN
INTRAVENOUS | Status: DC | PRN
  Administered 2012-10-07: 50 mg via INTRAVENOUS

## 2012-10-07 MED ORDER — MENTHOL 3 MG MT LOZG: 1.0000 | LOZENGE | OROMUCOSAL | Status: DC | PRN

## 2012-10-07 MED ORDER — ASPIRIN 81 MG PO CHEW
81.0000 mg | CHEWABLE_TABLET | Freq: Every day | ORAL | Status: DC
  Administered 2012-10-08 – 2012-10-10 (×3): 81 mg via ORAL
  Filled 2012-10-07 (×3): qty 1

## 2012-10-07 MED ORDER — LYSINE 500 MG PO CAPS: 500.0000 mg | ORAL_CAPSULE | Freq: Every day | ORAL | Status: DC

## 2012-10-07 MED ORDER — ACETAMINOPHEN 650 MG RE SUPP: 650.0000 mg | Freq: Four times a day (QID) | RECTAL | Status: DC | PRN

## 2012-10-07 MED ORDER — LACTATED RINGERS IV SOLN
INTRAVENOUS | Status: DC | PRN
  Administered 2012-10-07 (×2): via INTRAVENOUS

## 2012-10-07 MED ORDER — MIDAZOLAM HCL 5 MG/5ML IJ SOLN
INTRAMUSCULAR | Status: DC | PRN
  Administered 2012-10-07 (×2): 1 mg via INTRAVENOUS

## 2012-10-07 MED ORDER — DIPHENHYDRAMINE HCL 25 MG PO TABS
50.0000 mg | ORAL_TABLET | Freq: Every day | ORAL | Status: DC
  Administered 2012-10-07: 50 mg via ORAL
  Filled 2012-10-07 (×4): qty 2

## 2012-10-07 MED ORDER — METOCLOPRAMIDE HCL 10 MG PO TABS: 5.0000 mg | ORAL_TABLET | Freq: Three times a day (TID) | ORAL | Status: DC | PRN

## 2012-10-07 MED ORDER — SODIUM CHLORIDE 0.9 % IJ SOLN
INTRAMUSCULAR | Status: DC | PRN
  Administered 2012-10-07: 40 mL via INTRAVENOUS

## 2012-10-07 MED ORDER — SALINE SPRAY 0.65 % NA SOLN
1.0000 | Freq: Every day | NASAL | Status: DC | PRN
  Filled 2012-10-07: qty 44

## 2012-10-07 SURGICAL SUPPLY — 57 items
BAG ZIPLOCK 12X15 (MISCELLANEOUS) ×2 IMPLANT
BANDAGE ELASTIC 6 VELCRO ST LF (GAUZE/BANDAGES/DRESSINGS) ×2 IMPLANT
BANDAGE ESMARK 6X9 LF (GAUZE/BANDAGES/DRESSINGS) ×1 IMPLANT
BLADE SAW SGTL 13.0X1.19X90.0M (BLADE) ×2 IMPLANT
BNDG ESMARK 6X9 LF (GAUZE/BANDAGES/DRESSINGS) ×2
BOWL SMART MIX CTS (DISPOSABLE) ×2 IMPLANT
CEMENT HV SMART SET (Cement) ×2 IMPLANT
CLOTH BEACON ORANGE TIMEOUT ST (SAFETY) ×2 IMPLANT
CUFF TOURN SGL QUICK 34 (TOURNIQUET CUFF) ×1
CUFF TRNQT CYL 34X4X40X1 (TOURNIQUET CUFF) ×1 IMPLANT
DECANTER SPIKE VIAL GLASS SM (MISCELLANEOUS) ×2 IMPLANT
DERMABOND ADVANCED (GAUZE/BANDAGES/DRESSINGS) ×1
DERMABOND ADVANCED .7 DNX12 (GAUZE/BANDAGES/DRESSINGS) ×1 IMPLANT
DRAPE EXTREMITY T 121X128X90 (DRAPE) ×2 IMPLANT
DRAPE POUCH INSTRU U-SHP 10X18 (DRAPES) ×2 IMPLANT
DRAPE U-SHAPE 47X51 STRL (DRAPES) ×2 IMPLANT
DRSG AQUACEL AG ADV 3.5X10 (GAUZE/BANDAGES/DRESSINGS) ×2 IMPLANT
DRSG TEGADERM 4X4.75 (GAUZE/BANDAGES/DRESSINGS) ×2 IMPLANT
DURAPREP 26ML APPLICATOR (WOUND CARE) ×2 IMPLANT
ELECT REM PT RETURN 9FT ADLT (ELECTROSURGICAL) ×2
ELECTRODE REM PT RTRN 9FT ADLT (ELECTROSURGICAL) ×1 IMPLANT
EVACUATOR 1/8 PVC DRAIN (DRAIN) ×2 IMPLANT
FACESHIELD LNG OPTICON STERILE (SAFETY) ×12 IMPLANT
GAUZE SPONGE 2X2 8PLY STRL LF (GAUZE/BANDAGES/DRESSINGS) ×1 IMPLANT
GAUZE XEROFORM 2X2 STRL (GAUZE/BANDAGES/DRESSINGS) ×2 IMPLANT
GLOVE BIOGEL PI IND STRL 7.5 (GLOVE) ×1 IMPLANT
GLOVE BIOGEL PI IND STRL 8 (GLOVE) ×1 IMPLANT
GLOVE BIOGEL PI INDICATOR 7.5 (GLOVE) ×1
GLOVE BIOGEL PI INDICATOR 8 (GLOVE) ×1
GLOVE ECLIPSE 8.0 STRL XLNG CF (GLOVE) ×2 IMPLANT
GLOVE ORTHO TXT STRL SZ7.5 (GLOVE) ×4 IMPLANT
GOWN BRE IMP PREV XXLGXLNG (GOWN DISPOSABLE) ×2 IMPLANT
GOWN STRL NON-REIN LRG LVL3 (GOWN DISPOSABLE) ×2 IMPLANT
HANDPIECE INTERPULSE COAX TIP (DISPOSABLE) ×1
IMMOBILIZER KNEE 20 (SOFTGOODS)
IMMOBILIZER KNEE 20 THIGH 36 (SOFTGOODS) IMPLANT
KIT BASIN OR (CUSTOM PROCEDURE TRAY) ×2 IMPLANT
MANIFOLD NEPTUNE II (INSTRUMENTS) ×2 IMPLANT
NDL SAFETY ECLIPSE 18X1.5 (NEEDLE) ×1 IMPLANT
NEEDLE HYPO 18GX1.5 SHARP (NEEDLE) ×1
NS IRRIG 1000ML POUR BTL (IV SOLUTION) ×4 IMPLANT
PACK TOTAL JOINT (CUSTOM PROCEDURE TRAY) ×2 IMPLANT
POSITIONER SURGICAL ARM (MISCELLANEOUS) ×2 IMPLANT
SET HNDPC FAN SPRY TIP SCT (DISPOSABLE) ×1 IMPLANT
SET PAD KNEE POSITIONER (MISCELLANEOUS) ×2 IMPLANT
SPONGE GAUZE 2X2 STER 10/PKG (GAUZE/BANDAGES/DRESSINGS) ×1
SUCTION FRAZIER 12FR DISP (SUCTIONS) ×2 IMPLANT
SUT MNCRL AB 4-0 PS2 18 (SUTURE) ×2 IMPLANT
SUT VIC AB 1 CT1 36 (SUTURE) ×2 IMPLANT
SUT VIC AB 2-0 CT1 27 (SUTURE) ×3
SUT VIC AB 2-0 CT1 TAPERPNT 27 (SUTURE) ×3 IMPLANT
SUT VLOC 180 0 24IN GS25 (SUTURE) ×2 IMPLANT
SYR 50ML LL SCALE MARK (SYRINGE) ×2 IMPLANT
TOWEL OR 17X26 10 PK STRL BLUE (TOWEL DISPOSABLE) ×4 IMPLANT
TRAY FOLEY CATH 14FRSI W/METER (CATHETERS) ×2 IMPLANT
WATER STERILE IRR 1500ML POUR (IV SOLUTION) IMPLANT
WRAP KNEE MAXI GEL POST OP (GAUZE/BANDAGES/DRESSINGS) ×2 IMPLANT

## 2012-10-07 NOTE — Progress Notes (Signed)
UR COMPLETED  

## 2012-10-07 NOTE — Plan of Care (Signed)
Problem: Consults Goal: Diagnosis- Total Joint Replacement Primary Total Knee     

## 2012-10-07 NOTE — Transfer of Care (Signed)
Immediate Anesthesia Transfer of Care Note  Patient: [name] [name]  Procedure(s) Performed: Procedure(s) (LRB): TOTAL KNEE ARTHROPLASTY (Right)  Patient Location: PACU  Anesthesia Type: General  Level of Consciousness: sedated, patient cooperative and responds to stimulaton  Airway & Oxygen Therapy: Patient Spontanous Breathing and Patient connected to face mask oxgen  Post-op Assessment: Report given to PACU RN and Post -op Vital signs reviewed and stable  Post vital signs: Reviewed and stable  Complications: No apparent anesthesia complications

## 2012-10-07 NOTE — Anesthesia Postprocedure Evaluation (Signed)
  Anesthesia Post-op Note  Patient: [name] [name]  Procedure(s) Performed: Procedure(s) (LRB): TOTAL KNEE ARTHROPLASTY (Right)  Patient Location: PACU  Anesthesia Type: GA combined with regional for post-op pain  Level of Consciousness: awake and alert   Airway and Oxygen Therapy: Patient Spontanous Breathing  Post-op Pain: mild  Post-op Assessment: Post-op Vital signs reviewed, Patient's Cardiovascular Status Stable, Respiratory Function Stable, Patent Airway and No signs of Nausea or vomiting  Last Vitals:  Filed Vitals:   10/07/12 1245  BP: 104/68  Pulse: 70  Temp:   Resp: 9    Post-op Vital Signs: stable   Complications: No apparent anesthesia complications

## 2012-10-07 NOTE — Progress Notes (Signed)
Visited with patient and his family. Offered support and ended visit with a prayer. He requested another visit. Told him if her was here on my return on Thursday, I would stop in and see him.

## 2012-10-07 NOTE — Anesthesia Procedure Notes (Signed)
Anesthesia Regional Block:  Femoral nerve block  Pre-Anesthetic Checklist: ,, timeout performed, Correct Patient, Correct Site, Correct Laterality, Correct Procedure, Correct Position, site marked, Risks and benefits discussed,  Surgical consent,  Pre-op evaluation,  At surgeon's request and post-op pain management  Laterality: Right and Lower  Prep: chloraprep       Needles:  Injection technique: Single-shot  Needle Type: Stimiplex          Additional Needles:  Procedures: ultrasound guided (picture in chart) and nerve stimulator Femoral nerve block  Nerve Stimulator or Paresthesia:  Response: quadriceps, 0.5 mA,   Additional Responses:   Narrative:   Performed by: Personally  Anesthesiologist: denenny  Additional Notes: No pain on injection. No increased resistance to injection. Motor intact immediately after block. Loss of quadriceps strength at 20 minutes.

## 2012-10-07 NOTE — Progress Notes (Signed)
PHARMACIST - PHYSICIAN ORDER COMMUNICATION  CONCERNING: P&T Medication Policy on Herbal Medications  DESCRIPTION:  This patient's order for:  Melatonin and Lysine  has been noted.  This product(s) is classified as an "herbal" or natural product. Due to a lack of definitive safety studies or FDA approval, nonstandard manufacturing practices, plus the potential risk of unknown drug-drug interactions while on inpatient medications, the Pharmacy and Therapeutics Committee does not permit the use of "herbal" or natural products of this type within [facility].   ACTION TAKEN: The pharmacy department is unable to verify this order at this time and your patient has been informed of this safety policy. Please reevaluate patient's clinical condition at discharge and address if the herbal or natural product(s) should be resumed at that time.   Loralee Pacas, PharmD, BCPS 10/07/2012 3:02 PM

## 2012-10-07 NOTE — Op Note (Signed)
NAME:  [name] [name]                      MEDICAL RECORD NO.:  [id number]                             FACILITY:  [facility]      PHYSICIAN:  Madlyn Frankel. Charlann Boxer, M.D.  DATE OF BIRTH:  [birth year]      DATE OF PROCEDURE:  10/07/2012                                     OPERATIVE REPORT         PREOPERATIVE DIAGNOSIS:  Right knee osteoarthritis.      POSTOPERATIVE DIAGNOSIS:  Right knee osteoarthritis.      FINDINGS:  The patient was noted to have complete loss of cartilage and   bone-on-bone arthritis with associated osteophytes in the medial and patellofemoral compartments of   the knee with a significant synovitis and associated effusion.      PROCEDURE:  Right total knee replacement.      COMPONENTS USED:  DePuy rotating platform posterior stabilized knee   system, a size 5 femur, 5 tibia, 10 mm PS insert, and 41 patellar   button.      SURGEON:  Madlyn Frankel. Charlann Boxer, M.D.      ASSISTANT:  Lanney Gins, PA-C.      ANESTHESIA:  Spinal.      SPECIMENS:  None.      COMPLICATION:  None.      DRAINS:  One Hemovac.  EBL: <150cc      TOURNIQUET TIME:   Total Tourniquet Time Documented: Thigh (Right) - 41 minutes Total: Thigh (Right) - 41 minutes  .      The patient was stable to the recovery room.      INDICATION FOR PROCEDURE:  [name] [name] is a 72 y.o. male patient of   mine.  The patient had been seen, evaluated, and treated conservatively in the   office with medication, activity modification, and injections.  The patient had   radiographic changes of bone-on-bone arthritis with endplate sclerosis and osteophytes noted.      The patient failed conservative measures including medication, injections, and activity modification, and at this point was ready for more definitive measures.   Based on the radiographic changes and failed conservative measures, the patient   decided to proceed with total knee replacement.  Risks of infection,   DVT, component failure, need for revision  surgery, postop course, and   expectations were all   discussed and reviewed.  Consent was obtained for benefit of pain   relief.      PROCEDURE IN DETAIL:  The patient was brought to the operative theater.   Once adequate anesthesia, preoperative antibiotics, 2 gm of Ancef administered, the patient was positioned supine with the right thigh tourniquet placed.  The  right lower extremity was prepped and draped in sterile fashion.  A time-   out was performed identifying the patient, planned procedure, and   extremity.      The right lower extremity was placed in the [facility] leg holder.  The leg was   exsanguinated, tourniquet elevated to 250 mmHg.  A midline incision was   made followed by median parapatellar arthrotomy.  Following initial   exposure, attention was  first directed to the patella.  Precut   measurement was noted to be 26 mm.  I resected down to 14 mm and used a   41 patellar button to restore patellar height as well as cover the cut   surface.      The lug holes were drilled and a metal shim was placed to protect the   patella from retractors and saw blades.      At this point, attention was now directed to the femur.  The femoral   canal was opened with a drill, irrigated to try to prevent fat emboli.  An   intramedullary rod was passed at 5 degrees valgus, 10 mm of bone was   resected off the distal femur.  Following this resection, the tibia was   subluxated anteriorly.  Using the extramedullary guide, 10 mm of bone was resected off   the proximal lateral tibia.  We confirmed the gap would be   stable medially and laterally with a 10 mm insert as well as confirmed   the cut was perpendicular in the coronal plane, checking with an alignment rod.      Once this was done, I sized the femur to be a size 5 in the anterior-   posterior dimension, chose a standard component based on medial and   lateral dimension.  The size 5 rotation block was then pinned in   position  anterior referenced using the C-clamp to set rotation.  The   anterior, posterior, and  chamfer cuts were made without difficulty nor   notching making certain that I was along the anterior cortex to help   with flexion gap stability.      The final box cut was made off the lateral aspect of distal femur.      At this point, the tibia was sized to be a size 5, the size 5 tray was   then pinned in position through the medial third of the tubercle,   drilled, and keel punched.  Trial reduction was now carried with a 5 femur,  5 tibia, a 10 mm insert, and the 41 patella botton.  The knee was brought to   extension, full extension with good flexion stability with the patella   tracking through the trochlea without application of pressure.  Given   all these findings, the trial components removed.  Final components were   opened and cement was mixed.  The knee was irrigated with normal saline   solution and pulse lavage.  The synovial lining was   then injected with 0.25% Marcaine with epinephrine and 1 cc of Toradol,   total of 61 cc.      The knee was irrigated.  Final implants were then cemented onto clean and   dried cut surfaces of bone with the knee brought to extension with a 10 mm trial insert.      Once the cement had fully cured, the excess cement was removed   throughout the knee.  I confirmed I was satisfied with the range of   motion and stability, and the final 10 mm PS insert was chosen.  It was   placed into the knee.      The tourniquet had been let down at 40 minutes.  No significant   hemostasis required.  The medium Hemovac drain was placed deep.  The   extensor mechanism was then reapproximated using #1 Vicryl with the knee   in flexion.  The  remaining wound was closed with 2-0 Vicryl and running 4-0 Monocryl.   The knee was cleaned, dried, dressed sterilely using Dermabond and   Aquacel dressing.  Drain site dressed separately.  The patient was then   brought to  recovery room in stable condition, tolerating the procedure   well.   Please note that Physician Assistant, Lanney Gins, was present for the case, and was utilized for positioning, peri-operative retractor management, general facilitation of the procedure.  He was also utilized for primary wound closure at the end of the case.              Madlyn Frankel Charlann Boxer, M.D.

## 2012-10-07 NOTE — Progress Notes (Signed)
Pt states he is not taking Seroquel, Benadryl, Atorvastatin, Ativan, Colace, Miralax, and Niacin because of the cost. Verbalized understanding that he should not stop these meds abruptly.

## 2012-10-07 NOTE — Anesthesia Preprocedure Evaluation (Addendum)
Anesthesia Evaluation  Patient identified by MRN, date of birth, ID band Patient awake    Reviewed: Allergy & Precautions, H&P , NPO status , Patient's Chart, lab work & pertinent test results  Airway Mallampati: II TM Distance: >3 FB Neck ROM: Full    Dental no notable dental hx.    Pulmonary sleep apnea , COPD breath sounds clear to auscultation  Pulmonary exam normal       Cardiovascular hypertension, + CAD, + Past MI and + Peripheral Vascular Disease + dysrhythmias Supra Ventricular Tachycardia Rhythm:Regular Rate:Normal  ECG and CXR reviewed.  Stress test normal 11/06/2011   Neuro/Psych  Headaches, PSYCHIATRIC DISORDERS Anxiety Depression    GI/Hepatic Neg liver ROS, GERD-  Medicated,  Endo/Other  negative endocrine ROS  Renal/GU negative Renal ROS  negative genitourinary   Musculoskeletal negative musculoskeletal ROS (+)   Abdominal   Peds negative pediatric ROS (+)  Hematology negative hematology ROS (+)   Anesthesia Other Findings   Reproductive/Obstetrics negative OB ROS                          Anesthesia Physical Anesthesia Plan  ASA: III  Anesthesia Plan: General   Post-op Pain Management:    Induction: Intravenous  Airway Management Planned: Oral ETT  Additional Equipment:   Intra-op Plan:   Post-operative Plan: Extubation in OR  Informed Consent: I have reviewed the patients History and Physical, chart, labs and discussed the procedure including the risks, benefits and alternatives for the proposed anesthesia with the patient or authorized representative who has indicated his/her understanding and acceptance.   Dental advisory given  Plan Discussed with: CRNA  Anesthesia Plan Comments: ( Off plavix for 5 days. Plan general with femoral nerve block. Patient consents.Discussed risks of femoral nerve block including failure, bleeding, infection, nerve damage.  Femoral  nerve block does not usually prevent all pain. Specifically, it treats the anterior, but often not the posterior knee. Questions answered.  Patient consents to block.)       Anesthesia Quick Evaluation

## 2012-10-08 ENCOUNTER — Encounter (HOSPITAL_COMMUNITY): Payer: Self-pay | Admitting: Orthopedic Surgery

## 2012-10-08 DIAGNOSIS — Z96659 Presence of unspecified artificial knee joint: Secondary | ICD-10-CM

## 2012-10-08 DIAGNOSIS — D500 Iron deficiency anemia secondary to blood loss (chronic): Secondary | ICD-10-CM

## 2012-10-08 LAB — CBC
HCT: 28.8 % — ABNORMAL LOW (ref 39.0–52.0)
Hemoglobin: 9.4 g/dL — ABNORMAL LOW (ref 13.0–17.0)
MCV: 86.2 fL (ref 78.0–100.0)
Platelets: 123 10*3/uL — ABNORMAL LOW (ref 150–400)
RBC: 3.34 MIL/uL — ABNORMAL LOW (ref 4.22–5.81)
WBC: 6.8 10*3/uL (ref 4.0–10.5)

## 2012-10-08 LAB — BASIC METABOLIC PANEL
CO2: 30 mEq/L (ref 19–32)
Chloride: 103 mEq/L (ref 96–112)
Creatinine, Ser: 0.96 mg/dL (ref 0.50–1.35)
Glucose, Bld: 121 mg/dL — ABNORMAL HIGH (ref 70–99)

## 2012-10-08 MED ORDER — DIPHENHYDRAMINE HCL 25 MG PO CAPS
50.0000 mg | ORAL_CAPSULE | Freq: Every day | ORAL | Status: DC
  Administered 2012-10-09: 50 mg via ORAL
  Filled 2012-10-08: qty 2

## 2012-10-08 MED ORDER — TRAMADOL HCL 50 MG PO TABS
50.0000 mg | ORAL_TABLET | Freq: Four times a day (QID) | ORAL | Status: DC | PRN
  Administered 2012-10-09 (×2): 50 mg via ORAL
  Filled 2012-10-08 (×2): qty 1

## 2012-10-08 NOTE — Progress Notes (Signed)
Pt was set up for his bed bath and told to call nurse or nurse's assistant when finished for help with gown change and putting undergarments on. Call bell left within patients reach. Nurse was sitting outside patient's room and heard the patient call for help. Nurse found patient on the floor on his bottom. Pt said he was trying to put his boxers on and lost his balance. Pt assisted to bedside. Vitals WNL. Pt denies chest pain or SOB. Denies hitting his head or knee, but states he bumped his elbow during fall. Small one inch abrasion noted to L elbow with some bleeding noted. Abrasion cleaned with NS and covered with Tegaderm. Patient assisted with gown change and left in bed with call bell in reach. Education done about calling before any ambulation or bedside sitting. PA notified. No new orders given at this time. Will continue to monitor patient.

## 2012-10-08 NOTE — Evaluation (Addendum)
Occupational Therapy Evaluation Patient Details Name: [name] [name] MRN: [id number] DOB: 1940/11/13 Today's Date: 10/08/2012 Time: 4098-1191 OT Time Calculation (min): 16 min  OT Assessment / Plan / Recommendation Clinical Impression  This 72 year old man was admitted for R TKA.  He will benefit from continued OT to increase safety and independence with ADLs with overall min guard level goals.      OT Assessment  Patient needs continued OT Services    Follow Up Recommendations   STSNF   Barriers to Discharge      Equipment Recommendations  3 in 1 bedside comode    Recommendations for Other Services    Frequency  Min 2X/week    Precautions / Restrictions Precautions Precautions: Knee Required Braces or Orthoses: Knee Immobilizer - Right Restrictions Other Position/Activity Restrictions: WBAt   Pertinent Vitals/Pain 3-4/10 R knee.  Repositioned and ice applied    ADL  Grooming: Minimal assistance Where Assessed - Grooming: Supported standing Upper Body Bathing: Supervision/safety Where Assessed - Upper Body Bathing: Unsupported sitting Lower Body Bathing: Moderate assistance Where Assessed - Lower Body Bathing: Supported sit to stand Upper Body Dressing: Minimal assistance (iv) Where Assessed - Upper Body Dressing: Unsupported sitting Lower Body Dressing: Maximal assistance (pt 40%) Where Assessed - Lower Body Dressing: Supported sit to stand Toilet Transfer: Minimal assistance Toilet Transfer Method: Sit to Barista: Raised toilet seat with arms (or 3-in-1 over toilet) Toileting - Clothing Manipulation and Hygiene: Minimal assistance Where Assessed - Toileting Clothing Manipulation and Hygiene: Sit to stand from 3-in-1 or toilet Equipment Used: Rolling walker Transfers/Ambulation Related to ADLs: ambulated to bathroom.  Pt needs mod to max cueing for safety with sequence, arm and leg position during sit to stand ADL Comments: Pt needs min A for  balance during adls and cues to keep at least one hand on walker:  had difficulty following these cues at times.      OT Diagnosis: Generalized weakness  OT Problem List: Decreased strength;Impaired balance (sitting and/or standing);Decreased knowledge of use of DME or AE;Decreased safety awareness;Decreased cognition OT Treatment Interventions: Self-care/ADL training;DME and/or AE instruction;Patient/family education;Balance training   OT Goals Acute Rehab OT Goals OT Goal Formulation: With patient Time For Goal Achievement: 10/15/12 Potential to Achieve Goals: Good ADL Goals Pt Will Transfer to Toilet: with min assist;Ambulation;3-in-1 (min guard, min cues) ADL Goal: Toilet Transfer - Progress: Goal set today Miscellaneous OT Goals Miscellaneous OT Goal #1: Pt will maintain balance during LB ADLs/toileting with one hand on RW and min guard, min cues OT Goal: Miscellaneous Goal #1 - Progress: Goal set today Miscellaneous OT Goal #2: Pt/wife will verbalize understanding of tub bench vs. when pt will be ready to step into tub with guarding OT Goal: Miscellaneous Goal #2 - Progress: Goal set today  Visit Information  Last OT Received On: 10/08/12 Assistance Needed: +1 PT/OT Co-Evaluation/Treatment: Yes    Subjective Data  Subjective: I'm going to go into the bathroom Patient Stated Goal: none stated.  Agreeable to OT   Prior Functioning     Home Living Lives With: Spouse Available Help at Discharge: Family;Available 24 hours/day Type of Home: House Home Access: Stairs to enter [employer] of Steps: 4 Entrance Stairs-Rails: Right;Left Home Layout: Multi-level;Able to live on main level with bedroom/bathroom Bathroom Shower/Tub: Engineer, manufacturing systems: Standard Home Adaptive Equipment: Walker - rolling;Straight cane Prior Function Level of Independence: Independent Able to Take Stairs?: Yes Vocation: Retired Musician: No difficulties  Vision/Perception     Cognition  Cognition Overall Cognitive Status: Impaired Area of Impairment: Following commands;Safety/judgement Arousal/Alertness: Awake/alert Orientation Level: Appears intact for tasks assessed Behavior During Session: [facility] for tasks performed Following Commands: Follows one step commands inconsistently Safety/Judgement: Decreased awareness of safety precautions;Decreased safety judgement for tasks assessed;Impulsive    Extremity/Trunk Assessment Right Upper Extremity Assessment RUE ROM/Strength/Tone: Within functional levels Left Upper Extremity Assessment LUE ROM/Strength/Tone: Within functional levels Trunk Assessment: Normal     Mobility   Transfers Sit to Stand: 4: Min assist;With upper extremity assist;From bed Stand to Sit: 4: Min assist;With upper extremity assist;With armrests;To chair/3-in-1 Details for Transfer Assistance: Assist to rise and steady with max cues for safety as pt wanted to grab for walker and stand impulsively.  Also cues for hand placement and LE management.      Exercise     Balance Balance Balance Assessed: Yes Dynamic Standing Balance Dynamic Standing - Balance Support: No upper extremity supported Dynamic Standing - Level of Assistance: 4: Min assist Dynamic Standing - Comments: during adls:  cued for unilateral support but pt did not follow cue   End of Session OT - End of Session Activity Tolerance: Patient tolerated treatment well Patient left: in bed;with call bell/phone within reach  GO     SPENCER,MARYELLEN 10/08/2012, 4:09 PM Marica Otter, OTR/L [phone] 10/08/2012

## 2012-10-08 NOTE — Evaluation (Signed)
Physical Therapy Evaluation Patient Details Name: [name] [name] MRN: [id number] DOB: [birth year] Today's Date: 10/08/2012 Time: 1884-1660 PT Time Calculation (min): 21 min  PT Assessment / Plan / Recommendation Clinical Impression  Pt presents s/p R TKA POD 1 with decreased strength, ROM and mobility.  Note that prior to evaluation, pt had tried to get up from bed on his own and fell in floor, scraping elbow.  Pt states he did not hit knee, but it bent more than he would have liked.  Tolerated OOB and ambulation in hallway at min assist with RW, however did c/o pain.  Pt will benefit from skilled PT in acute venue to address deficits.  PT recommends HHPT for follow up at D/C to maximize pts safety and function.     PT Assessment  Patient needs continued PT services    Follow Up Recommendations  Home health PT;Supervision/Assistance - 24 hour    Does the patient have the potential to tolerate intense rehabilitation      Barriers to Discharge None      Equipment Recommendations   (pts wife thinks walker at home is RW)    Recommendations for Other Services     Frequency 7X/week    Precautions / Restrictions     Pertinent Vitals/Pain 8/10 pain, pt premedicated, ice packs applied      Mobility  Bed Mobility Bed Mobility: Supine to Sit Supine to Sit: 3: Mod assist;HOB elevated;With rails Details for Bed Mobility Assistance: Assist for RLE out of bed and for trunk to attain sitting position.  Pt with trouble assisting with L UE due to abrasion on L elbow from fall.  cues for hand placement and technique to self assist.  Transfers Transfers: Sit to Stand;Stand to Sit Sit to Stand: 4: Min assist;With upper extremity assist;From bed Stand to Sit: 4: Min assist;With upper extremity assist;With armrests;To chair/3-in-1 Details for Transfer Assistance: Assist to rise and steady with max cues for safety as pt wanted to grab for walker and stand impulsively.  Also cues for hand placement  and LE management.  Ambulation/Gait Ambulation/Gait Assistance: 4: Min assist Ambulation Distance (Feet): 38 Feet Assistive device: Rolling walker Ambulation/Gait Assistance Details: cues for sequencing/technique with RW and for upright posture.  Initially had trouble with wanting to step too far inside of RW.  Gait Pattern: Step-to pattern;Decreased stride length;Antalgic;Trunk flexed Gait velocity: decreased Stairs: No Wheelchair Mobility Wheelchair Mobility: No    Exercises     PT Diagnosis: Difficulty walking;Abnormality of gait;Acute pain  PT Problem List: Decreased strength;Decreased range of motion;Decreased activity tolerance;Decreased balance;Decreased mobility;Decreased coordination;Decreased knowledge of use of DME;Decreased safety awareness;Decreased knowledge of precautions;Pain PT Treatment Interventions: DME instruction;Gait training;Stair training;Functional mobility training;Therapeutic activities;Therapeutic exercise;Balance training;Patient/family education   PT Goals Acute Rehab PT Goals PT Goal Formulation: With patient Time For Goal Achievement: 10/11/12 Potential to Achieve Goals: Good Pt will go Supine/Side to Sit: with supervision PT Goal: Supine/Side to Sit - Progress: Goal set today Pt will go Sit to Supine/Side: with supervision PT Goal: Sit to Supine/Side - Progress: Goal set today Pt will go Sit to Stand: with supervision PT Goal: Sit to Stand - Progress: Goal set today Pt will go Stand to Sit: with supervision PT Goal: Stand to Sit - Progress: Goal set today Pt will Ambulate: 51 - 150 feet;with supervision;with least restrictive assistive device PT Goal: Ambulate - Progress: Goal set today Pt will Go Up / Down Stairs: 3-5 stairs;with min assist;with least restrictive assistive device;with rail(s) PT Goal:  Up/Down Stairs - Progress: Goal set today  Visit Information  Last PT Received On: 10/08/12 Assistance Needed: +1    Subjective Data   Subjective: I'll call before I fall. Patient Stated Goal: to return home.    Prior Functioning  Home Living Lives With: Spouse Available Help at Discharge: Family;Available 24 hours/day Type of Home: House Home Access: Stairs to enter [employer] of Steps: 4 Entrance Stairs-Rails: Right;Left Home Layout: Multi-level;Able to live on main level with bedroom/bathroom Bathroom Shower/Tub: Engineer, manufacturing systems: Standard Home Adaptive Equipment: Walker - rolling;Straight cane Prior Function Level of Independence: Independent Able to Take Stairs?: Yes Vocation: Retired Musician: No difficulties    Copywriter, advertising Overall Cognitive Status: Impaired Area of Impairment: Following commands;Safety/judgement Arousal/Alertness: Awake/alert Orientation Level: Appears intact for tasks assessed Behavior During Session: [facility] for tasks performed Following Commands: Follows one step commands inconsistently Safety/Judgement: Decreased awareness of safety precautions;Decreased safety judgement for tasks assessed;Impulsive    Extremity/Trunk Assessment Right Lower Extremity Assessment RLE ROM/Strength/Tone: Deficits RLE ROM/Strength/Tone Deficits: ankle motions WFL, unable to perform SLR and had fallen in room prior to eval, therefore ordered KI for pt.  RLE Sensation: WFL - Light Touch Left Lower Extremity Assessment LLE ROM/Strength/Tone: WFL for tasks assessed LLE Sensation: WFL - Light Touch Trunk Assessment Trunk Assessment: Normal   Balance    End of Session PT - End of Session Equipment Utilized During Treatment: Gait belt;Right knee immobilizer Activity Tolerance: Patient limited by pain Patient left: in chair;with call bell/phone within reach;with family/visitor present Nurse Communication: Mobility status  GP     Vista Deck 10/08/2012, 3:59 PM

## 2012-10-08 NOTE — Progress Notes (Signed)
Physical Therapy Treatment Patient Details Name: [name] [name] MRN: [id number] DOB: 1940/09/10 Today's Date: 10/08/2012 Time: 6213-0865 PT Time Calculation (min): 24 min  PT Assessment / Plan / Recommendation Comments on Treatment Session  Pt continues to be somewhat more impulsive this afternoon and confusion seems to be increased as well.  Following session, pt tried to get OOB again unassisted and bed alarmed.  Pt states he wants to get to chair, therefore assisted to chair and with chair alarm in place.  Feel pt would best benefit from SNF at this time for increased safety concerns.     Follow Up Recommendations  SNF;Supervision/Assistance - 24 hour     Does the patient have the potential to tolerate intense rehabilitation     Barriers to Discharge None      Equipment Recommendations   (pts wife thinks walker at home is RW)    Recommendations for Other Services    Frequency 7X/week   Plan Discharge plan needs to be updated    Precautions / Restrictions Precautions Precautions: Knee Required Braces or Orthoses: Knee Immobilizer - Right Knee Immobilizer - Right: Other (comment) (No order for KI, but unable to perform SLR and due to fall) Restrictions Weight Bearing Restrictions: No Other Position/Activity Restrictions: WBAT   Pertinent Vitals/Pain 3/10 pain    Mobility  Bed Mobility Bed Mobility: Sit to Supine Supine to Sit: 3: Mod assist;HOB elevated;With rails Sit to Supine: 4: Min assist;HOB flat Details for Bed Mobility Assistance: Assist for RLE into bed with cues for technique.  Transfers Transfers: Sit to Stand;Stand to Sit Sit to Stand: 4: Min guard;With armrests;From chair/3-in-1 Stand to Sit: 4: Min guard;With upper extremity assist;4: Min assist;To bed Details for Transfer Assistance: Continues to be somewhat impulsive with standing and requires MAX cues for hand placement when sitting/standing.  Ambulation/Gait Ambulation/Gait Assistance: 4: Min  assist Ambulation Distance (Feet): 52 Feet (then another 18) Assistive device: Rolling walker Ambulation/Gait Assistance Details: Continues to require mod cues for sequencing/technique with RW as he tends to step too far inside of RW.  Gait Pattern: Step-to pattern;Decreased stride length;Antalgic;Trunk flexed Gait velocity: decreased Stairs: No Wheelchair Mobility Wheelchair Mobility: No    Exercises Total Joint Exercises Ankle Circles/Pumps: AROM;Both;20 reps Quad Sets: AROM;Right;10 reps Heel Slides: AAROM;Right;10 reps Hip ABduction/ADduction: AAROM;Right;10 reps   PT Diagnosis: Difficulty walking;Abnormality of gait;Acute pain  PT Problem List: Decreased strength;Decreased range of motion;Decreased activity tolerance;Decreased balance;Decreased mobility;Decreased coordination;Decreased knowledge of use of DME;Decreased safety awareness;Decreased knowledge of precautions;Pain PT Treatment Interventions: DME instruction;Gait training;Stair training;Functional mobility training;Therapeutic activities;Therapeutic exercise;Balance training;Patient/family education   PT Goals Acute Rehab PT Goals PT Goal Formulation: With patient Time For Goal Achievement: 10/11/12 Potential to Achieve Goals: Good Pt will go Supine/Side to Sit: with supervision PT Goal: Supine/Side to Sit - Progress: Goal set today Pt will go Sit to Supine/Side: with supervision PT Goal: Sit to Supine/Side - Progress: Progressing toward goal Pt will go Sit to Stand: with supervision PT Goal: Sit to Stand - Progress: Progressing toward goal Pt will go Stand to Sit: with supervision PT Goal: Stand to Sit - Progress: Progressing toward goal Pt will Ambulate: 51 - 150 feet;with supervision;with least restrictive assistive device PT Goal: Ambulate - Progress: Progressing toward goal Pt will Go Up / Down Stairs: 3-5 stairs;with min assist;with least restrictive assistive device;with rail(s) PT Goal: Up/Down Stairs -  Progress: Goal set today  Visit Information  Last PT Received On: 10/08/12 Assistance Needed: +1    Subjective Data  Subjective: I'm ready to get back to bed.  Patient Stated Goal: to return home.    Cognition  Cognition Overall Cognitive Status: Impaired Area of Impairment: Memory;Following commands;Safety/judgement Arousal/Alertness: Awake/alert Orientation Level: Appears intact for tasks assessed Behavior During Session: [facility] for tasks performed Memory: Decreased recall of precautions Following Commands: Follows one step commands inconsistently Safety/Judgement: Decreased awareness of safety precautions;Decreased safety judgement for tasks assessed;Impulsive    Balance  Balance Balance Assessed: Yes Dynamic Standing Balance Dynamic Standing - Balance Support: No upper extremity supported Dynamic Standing - Level of Assistance: 4: Min assist Dynamic Standing - Comments: during adls:  cued for unilateral support but pt did not follow cue  End of Session PT - End of Session Equipment Utilized During Treatment: Right knee immobilizer Activity Tolerance: Patient tolerated treatment well Patient left: in bed;with call bell/phone within reach;with bed alarm set Nurse Communication: Mobility status   GP     Vista Deck 10/08/2012, 4:44 PM

## 2012-10-08 NOTE — Progress Notes (Addendum)
Per pts RN Corene Cornea - Pt found with both hands on ground, right knee straight in immobilizer and left knee bent toward floor after chair alarm sounded for less than 5 sec.  Patient stated he slipped after he turned off alarm.  Patient denies a complete fall and states he did not hit his head.  There are no injuries noted.  Vital signs obtained 98.1, 161/80, 103, 18, 96 %ra.  Patient returned to bed with alarm on nurse tech at bedside.  Dr Victorino Dike made aware of the above.  Patient in bed with alarm on and call bell in reach patient states understanding to call.  Safety sitter assigned  This note addendum done to reflect fall time as RN did not change her note time to accurately reflect the time the pt actually fell.

## 2012-10-08 NOTE — Progress Notes (Signed)
   Subjective: 1 Day Post-Op Procedure(s) (LRB): TOTAL KNEE ARTHROPLASTY (Right)   Patient reports pain as mild, pain well controlled. He states that the knee itself feels swollen. No events throughout the night.   Objective:   VITALS:   Filed Vitals:   10/08/12 0735  BP: 117/75  Pulse: 62  Temp: 99.3 F (37.4 C)   Resp: 14    Neurovascular intact Dorsiflexion/Plantar flexion intact Incision: dressing C/D/I No cellulitis present Compartment soft  LABS  Recent Labs  10/08/12 0400  HGB 9.4*  HCT 28.8*  WBC 6.8  PLT 123*     Recent Labs  10/08/12 0400  NA 138  K 4.1  BUN 11  CREATININE 0.96  GLUCOSE 121*     Assessment/Plan: 1 Day Post-Op Procedure(s) (LRB): TOTAL KNEE ARTHROPLASTY (Right) HV drain d/c'ed Foley cath d/c'ed Advance diet Up with therapy D/C IV fluids Discharge home with home health eventually, when ready  Expected ABLA  Treated with iron and will observe      Anastasio Auerbach. Babish   PAC  10/08/2012, 8:57 AM

## 2012-10-09 LAB — BASIC METABOLIC PANEL
BUN: 12 mg/dL (ref 6–23)
CO2: 26 mEq/L (ref 19–32)
Chloride: 104 mEq/L (ref 96–112)
Creatinine, Ser: 1.03 mg/dL (ref 0.50–1.35)
GFR calc Af Amer: 84 mL/min — ABNORMAL LOW (ref >90)

## 2012-10-09 LAB — CBC
HCT: 22.2 % — ABNORMAL LOW (ref 39.0–52.0)
MCH: 28.1 pg (ref 26.0–34.0)
MCV: 84.4 fL (ref 78.0–100.0)
RDW: 14.5 % (ref 11.5–15.5)
WBC: 10.1 10*3/uL (ref 4.0–10.5)

## 2012-10-09 NOTE — Progress Notes (Signed)
Physical Therapy Treatment Patient Details Name: [name] [name] MRN: [id number] DOB: Nov 02, 1940 Today's Date: 10/09/2012 Time: 4098-1191 PT Time Calculation (min): 29 min  PT Assessment / Plan / Recommendation Comments on Treatment Session  Pt continues occasionally impulsive but very cooperative and eager to progress    Follow Up Recommendations  SNF;Supervision/Assistance - 24 hour     Does the patient have the potential to tolerate intense rehabilitation     Barriers to Discharge        Equipment Recommendations       Recommendations for Other Services    Frequency 7X/week   Plan Discharge plan remains appropriate    Precautions / Restrictions Precautions Precautions: Knee Required Braces or Orthoses: Knee Immobilizer - Right Knee Immobilizer - Right: Other (comment) Restrictions Weight Bearing Restrictions: No Other Position/Activity Restrictions: WBAT   Pertinent Vitals/Pain Pt reports pain as "not bad" - unable to rate on 1-10; premed with ice packs provided    Mobility  Bed Mobility Bed Mobility: Supine to Sit Supine to Sit: 4: Min assist;3: Mod assist Details for Bed Mobility Assistance: cues for sequence and use of L LE to self assist Transfers Transfers: Sit to Stand;Stand to Sit Sit to Stand: 4: Min assist Stand to Sit: 4: Min assist Details for Transfer Assistance: cues for use of UEs and for LE management Ambulation/Gait Ambulation/Gait Assistance: 4: Min assist Ambulation Distance (Feet): 111 Feet Assistive device: Rolling walker Ambulation/Gait Assistance Details: cues for sequence, posture and position from RW Gait Pattern: Step-to pattern;Trunk flexed Gait velocity: decreased Stairs: No Wheelchair Mobility Wheelchair Mobility: No    Exercises Total Joint Exercises Ankle Circles/Pumps: AROM;Both;20 reps Quad Sets: AROM;Right;20 reps;Supine Heel Slides: AAROM;Right;20 reps;Supine Straight Leg Raises: AAROM;20 reps;Supine;Right   PT Diagnosis:     PT Problem List:   PT Treatment Interventions:     PT Goals Acute Rehab PT Goals PT Goal Formulation: With patient Time For Goal Achievement: 10/11/12 Potential to Achieve Goals: Good Pt will go Supine/Side to Sit: with supervision PT Goal: Supine/Side to Sit - Progress: Progressing toward goal Pt will go Sit to Supine/Side: with supervision PT Goal: Sit to Supine/Side - Progress: Progressing toward goal Pt will go Sit to Stand: with supervision PT Goal: Sit to Stand - Progress: Progressing toward goal Pt will go Stand to Sit: with supervision PT Goal: Stand to Sit - Progress: Progressing toward goal Pt will Ambulate: 51 - 150 feet;with supervision;with least restrictive assistive device PT Goal: Ambulate - Progress: Progressing toward goal  Visit Information  Last PT Received On: 10/09/12 Assistance Needed: +1    Subjective Data  Subjective: I'll do what I need to to get better Patient Stated Goal: to return home.    Cognition  Cognition Overall Cognitive Status: Impaired Area of Impairment: Memory;Following commands;Safety/judgement Arousal/Alertness: Awake/alert Orientation Level: Appears intact for tasks assessed Behavior During Session: [facility] for tasks performed Memory: Decreased recall of precautions Following Commands: Follows one step commands consistently Safety/Judgement: Decreased awareness of safety precautions;Decreased safety judgement for tasks assessed;Impulsive    Balance     End of Session PT - End of Session Equipment Utilized During Treatment: Right knee immobilizer Activity Tolerance: Patient tolerated treatment well Patient left: in chair;with call bell/phone within reach;with family/visitor present Nurse Communication: Mobility status   GP     [name] [name],[name] [name] 10/09/2012, 12:55 PM

## 2012-10-09 NOTE — Progress Notes (Signed)
Stopped by to see patient. Offered support, friendship and explained spiritual care services to the patient. Offered prayer for his continued recovery.

## 2012-10-09 NOTE — Progress Notes (Signed)
   Subjective: 2 Days Post-Op Procedure(s) (LRB): TOTAL KNEE ARTHROPLASTY (Right)   Patient reports pain as mild, pain well controlled. The patient did sustain a fall yesterday when he got up without assistance when told to use the call bell. After this fall it was stressed to him that he needed to call for assistance when getting up. He again last night got out of bed and fell again. Both times he denied hitting his head. No other events throughout the night.   Objective:   VITALS:   Filed Vitals:   10/09/12 0443  BP: 136/72  Pulse: 88  Temp: 98.5 F (36.9 C)  Resp: 16    Neurovascular intact Dorsiflexion/Plantar flexion intact Incision: dressing C/D/I No cellulitis present Compartment soft  LABS  Recent Labs  10/08/12    0400  HGB 9.4*  HCT 28.8*  WBC 6.8  PLT 123*     Recent Labs  10/08/12   0400  NA 138  K 4.1  BUN 11  CREATININE 0.96  GLUCOSE 121*     Assessment/Plan: 2 Days Post-Op Procedure(s) (LRB): TOTAL KNEE ARTHROPLASTY (Right) Blister and surrounding area cleaned with Betadine and lanced with a 18 gauge needle. This will be allowed to drain. Up with therapy Discharge to SNF eventually when ready, unsure family will be able to care for him at home. Social work consult placed.   Anastasio Auerbach Babish   PAC  10/09/2012, 8:32 AM

## 2012-10-09 NOTE — Care Management Note (Signed)
    Page 1 of 1   10/09/2012     4:13:37 PM   CARE MANAGEMENT NOTE 10/09/2012  Patient:  [name] [name], [name] [name]   Account Number:  [id number]  Date Initiated:  10/09/2012  Documentation initiated by:  Colleen Can  Subjective/Objective Assessment:   dx total knee replacemnt -right     Action/Plan:   SNF rehab   Anticipated DC Date:  10/10/2012   Anticipated DC Plan:  SKILLED NURSING FACILITY  In-house referral  Clinical Social Worker      DC Planning Services  CM consult      Choice offered to / List presented to:             Status of service:  Completed, signed off Medicare Important Message given?  NA - LOS <3 / Initial given by admissions (If response is "NO", the following Medicare IM given date fields will be blank) Date Medicare IM given:   Date Additional Medicare IM given:    Discharge Disposition:    Per UR Regulation:    If discussed at Long Length of Stay Meetings, dates discussed:    Comments:

## 2012-10-09 NOTE — Progress Notes (Signed)
Clinical Social Work Department BRIEF PSYCHOSOCIAL ASSESSMENT 10/09/2012  Patient:  HEZEKIAH, VELTRE     Account Number:  [id number]     Admit date:  10/07/2012  Clinical Social Worker:  Candie Chroman  Date/Time:  10/09/2012 01:38 PM  Referred by:  Physician  Date Referred:  10/09/2012 Referred for  SNF Placement   Other Referral:   Interview type:  Family Other interview type:    PSYCHOSOCIAL DATA Living Status:  WIFE Admitted from facility:   Level of care:   Primary support name:  Russelene Wickliff Primary support relationship to patient:  SPOUSE Degree of support available:   supportive    CURRENT CONCERNS Current Concerns  Post-Acute Placement   Other Concerns:    SOCIAL WORK ASSESSMENT / PLAN Pt is a 72 yr old gentleman living at home prior to hospitalization. CSW met with pt's spouse to assist with d/c planning. ST SNF placement has been recommended by PT. SNF search has been initiated and bed offers provided. Spouse has chosen [facility] for [employer]. CSW will follow to assist with d/c planning to SNF.   Assessment/plan status:  Psychosocial Support/Ongoing Assessment of Needs Other assessment/ plan:   Information/referral to community resources:   SNF list provided. Insurance coverage reviewed.    PATIENT'S/FAMILY'S RESPONSE TO PLAN OF CARE: Spouse is unable to provided needed care at this time and feels ST SNF placement is a better option for rehab than PT services at home.   Cori Razor LCSW [phone]

## 2012-10-09 NOTE — Progress Notes (Signed)
Clinical Social Work Department CLINICAL SOCIAL WORK PLACEMENT NOTE 10/09/2012  Patient:  [name] [name], [name] [name]  Account Number:  [id number] Admit date:  10/07/2012  Clinical Social Worker:  Cori Razor, LCSW  Date/time:  10/09/2012 01:45 PM  Clinical Social Work is seeking post-discharge placement for this patient at the following level of care:   SKILLED NURSING   (*CSW will update this form in Epic as items are completed)   10/09/2012  Patient/family provided with [facility] Department of Clinical Social Work's list of facilities offering this level of care within the geographic area requested by the patient (or if unable, by the patient's family).  10/09/2012  Patient/family informed of their freedom to choose among providers that offer the needed level of care, that participate in Medicare, Medicaid or managed care program needed by the patient, have an available bed and are willing to accept the patient.  10/09/2012  Patient/family informed of [facility]' ownership interest in [facility], as well as of the fact that they are under no obligation to receive care at this facility.  PASARR submitted to EDS on 10/09/2012 PASARR number received from EDS on   FL2 transmitted to all facilities in geographic area requested by pt/family on  10/09/2012 FL2 transmitted to all facilities within larger geographic area on   Patient informed that his/her managed care company has contracts with or will negotiate with  certain facilities, including the following:     Patient/family informed of bed offers received:  10/09/2012 Patient chooses bed at [facility] Physician recommends and patient chooses bed at    Patient to be transferred to [facility] on   Patient to be transferred to facility by   The following physician request were entered in Epic:   Additional Comments:  Cori Razor LCSW [phone]

## 2012-10-09 NOTE — Progress Notes (Signed)
Physical Therapy Treatment Patient Details Name: [name] [name] MRN: [id number] DOB: 04-13-1941 Today's Date: 10/09/2012 Time: 4098-1191 PT Time Calculation (min): 23 min  PT Assessment / Plan / Recommendation Comments on Treatment Session  Pt continues occasionally impulsive but very cooperative and eager to progress    Follow Up Recommendations  SNF;Supervision/Assistance - 24 hour     Does the patient have the potential to tolerate intense rehabilitation     Barriers to Discharge        Equipment Recommendations       Recommendations for Other Services    Frequency 7X/week   Plan Discharge plan remains appropriate    Precautions / Restrictions Precautions Precautions: Knee Required Braces or Orthoses: Knee Immobilizer - Right Knee Immobilizer - Right: Discontinue once straight leg raise with < 10 degree lag Restrictions Weight Bearing Restrictions: No Other Position/Activity Restrictions: WBAT   Pertinent Vitals/Pain     Mobility  Bed Mobility Bed Mobility: Supine to Sit;Sit to Supine Supine to Sit: 4: Min assist Sit to Supine: 4: Min assist Details for Bed Mobility Assistance: cues for sequence and use of L LE to self assist Transfers Transfers: Sit to Stand;Stand to Sit Sit to Stand: 4: Min assist Stand to Sit: 4: Min assist Details for Transfer Assistance: cues for use of UEs and for LE management Ambulation/Gait Ambulation/Gait Assistance: 4: Min assist;4: Min guard Ambulation Distance (Feet): 121 Feet (and 20) Assistive device: Rolling walker Ambulation/Gait Assistance Details: cues for posture, sequence and position from RW Gait Pattern: Step-to pattern;Trunk flexed Gait velocity: decreased    Exercises     PT Diagnosis:    PT Problem List:   PT Treatment Interventions:     PT Goals Acute Rehab PT Goals PT Goal Formulation: With patient Time For Goal Achievement: 10/11/12 Potential to Achieve Goals: Good Pt will go Supine/Side to Sit: with  supervision PT Goal: Supine/Side to Sit - Progress: Progressing toward goal Pt will go Sit to Supine/Side: with supervision PT Goal: Sit to Supine/Side - Progress: Progressing toward goal Pt will go Sit to Stand: with supervision PT Goal: Sit to Stand - Progress: Progressing toward goal Pt will go Stand to Sit: with supervision PT Goal: Stand to Sit - Progress: Progressing toward goal Pt will Ambulate: 51 - 150 feet;with supervision;with least restrictive assistive device PT Goal: Ambulate - Progress: Progressing toward goal  Visit Information  Last PT Received On: 10/09/12 Assistance Needed: +1    Subjective Data  Subjective: I'll do what I need to to get better Patient Stated Goal: to return home.    Cognition  Cognition Overall Cognitive Status: Impaired Area of Impairment: Memory;Following commands;Safety/judgement Arousal/Alertness: Awake/alert Orientation Level: Appears intact for tasks assessed Behavior During Session: [facility] for tasks performed Memory: Decreased recall of precautions Following Commands: Follows one step commands consistently Safety/Judgement: Decreased awareness of safety precautions;Decreased safety judgement for tasks assessed;Impulsive    Balance     End of Session PT - End of Session Equipment Utilized During Treatment: Right knee immobilizer Activity Tolerance: Patient tolerated treatment well Patient left: in chair;with call bell/phone within reach;with family/visitor present Nurse Communication: Mobility status   GP     BRADSHAW,HUNTER 10/09/2012, 4:06 PM

## 2012-10-10 MED ORDER — TRAMADOL HCL 50 MG PO TABS: 50.0000 mg | ORAL_TABLET | Freq: Four times a day (QID) | ORAL | Status: DC | PRN

## 2012-10-10 MED ORDER — FERROUS SULFATE 325 (65 FE) MG PO TABS: 325.0000 mg | ORAL_TABLET | Freq: Three times a day (TID) | ORAL | Status: DC

## 2012-10-10 MED ORDER — TIZANIDINE HCL 4 MG PO CAPS: 4.0000 mg | ORAL_CAPSULE | Freq: Three times a day (TID) | ORAL | Status: DC

## 2012-10-10 MED ORDER — POLYETHYLENE GLYCOL 3350 17 G PO PACK: 17.0000 g | PACK | Freq: Two times a day (BID) | ORAL | Status: DC

## 2012-10-10 MED ORDER — DSS 100 MG PO CAPS: 100.0000 mg | ORAL_CAPSULE | Freq: Two times a day (BID) | ORAL | Status: DC

## 2012-10-10 NOTE — Progress Notes (Signed)
   Subjective: 3 Days Post-Op Procedure(s) (LRB): TOTAL KNEE ARTHROPLASTY (Right)   Patient reports pain as mild, pain well controlled. No events throughout the night. Wanting his breakfast and some coffe, but otherwise doing well. Ready to be discharged to SNF.  Objective:   VITALS:   Filed Vitals:   10/10/12  BP: 130/69  Pulse: 82  Temp: 99 F (37.2 C)   Resp: 16    Neurovascular intact Dorsiflexion/Plantar flexion intact Incision: dressing C/D/I and scant drainage No cellulitis present Compartment soft  LABS  Recent Labs  10/08/12 0400 10/09/12 0929  HGB 9.4* 7.4*  HCT 28.8* 22.2*  WBC 6.8 10.1  PLT 123* 114*     Recent Labs  10/08/12 0400 10/09/12 0929  NA 138 136  K 4.1 3.8  BUN 11 12  CREATININE 0.96 1.03  GLUCOSE 121* 127*     Assessment/Plan: 3 Days Post-Op Procedure(s) (LRB): TOTAL KNEE ARTHROPLASTY (Right) Up with therapy Discharge to SNF Follow up in 2 weeks at [facility]. Follow up with OLIN,MATTHEW D in 2 weeks.  Contact information:  [facility] [address], [address] [place] [address] [phone]        Anastasio Auerbach. Babish   PAC  10/10/2012, 8:18 AM

## 2012-10-10 NOTE — Discharge Summary (Signed)
Physician Discharge Summary  Patient ID: [name] [name] MRN: [id number] DOB/AGE: 1941/02/26 72 y.o.  Admit date: 10/07/2012 Discharge date:  10/10/2012  Procedures:  Procedure(s) (LRB): TOTAL KNEE ARTHROPLASTY (Right)  Attending Physician:  Dr. Durene Romans   Admission Diagnoses:   Right knee OA / pain  Discharge Diagnoses:  Principal Problem:   S/P right TKA Active Problems:   Expected blood loss anemia  Diagnosis  . Coronary artery disease  . Hypertension  . Hyperlipidemia  . SVT (supraventricular tachycardia)  . Sleep apnea  . Anxiety  . Glucose intolerance (impaired glucose tolerance)  . Osteoarthritis  . Carotid stenosis  . Epistaxis  . Short-term memory loss  . Long-term memory loss   . Joint pain  . Joint swelling  . Back pain  . Bruises easily  . GERD (gastroesophageal reflux disease)  . Hemorrhoids  . Constipation  . History of colon polyps  . Urinary frequency  . Depression  . Insomnia  . Myocardial infarction  . COPD (chronic obstructive pulmonary disease)  . Cancer  . Nosebleed  . Headache  . Dementia    HPI:   [name] [name], 72 y.o. male, has a history of pain and functional disability in the right knee due to arthritis and has failed non-surgical conservative treatments for greater than 12 weeks to includeNSAID's and/or analgesics, corticosteriod injections, use of assistive devices and activity modification. Onset of symptoms was gradual, starting 2 years ago with gradually worsening course since that time. The patient noted prior procedures on the knee to include arthroscopy on the right knee(s). Patient currently rates pain in the right knee(s) at 9 out of 10 with activity. Patient has night pain, worsening of pain with activity and weight bearing, pain that interferes with activities of daily living, pain with passive range of motion, crepitus and joint swelling. Patient has evidence of periarticular osteophytes and joint space narrowing by  imaging studies. There is no active infection. Risks, benefits and expectations were discussed with the patient. Patient understand the risks, benefits and expectations and wishes to proceed with surgery.   PCP: Samuel Jester, DO   Discharged Condition: good  Hospital Course:  Patient underwent the above stated procedure on 10/07/2012. Patient tolerated the procedure well and brought to the recovery room in good condition and subsequently to the floor.  POD #1 BP: 117/75 ; Pulse: 62 ; Temp: 99.3 F (37.4 C) ; Resp: 14  Pt's foley was removed, as well as the hemovac drain removed. IV was changed to a saline lock. Patient reports pain as mild, pain well controlled. He states that the knee itself feels swollen. No events throughout the night.  Neurovascular intact, dorsiflexion/plantar flexion intact, incision: dressing C/D/I, no cellulitis present and compartment soft.   LABS  Basename  10/08/12    0400   HGB  9.4  HCT  28.8   POD #2  BP: 136/72 ; Pulse: 88 ; Temp: 98.5 F (36.9 C) ; Resp: 16  Patient reports pain as mild, pain well controlled. The patient did sustain a fall yesterday when he got up without assistance when told to use the call bell. After this fall it was stressed to him that he needed to call for assistance when getting up. He again last night got out of bed and fell again. Both times he denied hitting his head. No other events throughout the night. Neurovascular intact, dorsiflexion/plantar flexion intact, incision: dressing has scant drainage, no cellulitis present and compartment soft.  LABS  Basename  10/09/12  HGB  7.4  HCT  22.2   POD #3  BP: 130/69 ; Pulse: 82 ; Temp: 99 F (37.2 C) ; Resp: 16  Patient reports pain as mild, pain well controlled. No events throughout the night. Wanting his breakfast and some coffe, but otherwise doing well. Ready to be discharged to SNF. Neurovascular intact, dorsiflexion/plantar flexion intact, incision: dressing has scant  drainage, no cellulitis present and compartment soft.   LABS   No new labs  Discharge Exam: General appearance: alert, cooperative and no distress Extremities: Homans sign is negative, no sign of DVT, no edema, redness or tenderness in the calves or thighs and no ulcers, gangrene or trophic changes  Disposition: SNF with follow up in 2 weeks   Follow-up Information   Follow up with Shelda Pal, MD. Schedule an appointment as soon as possible for a visit in 2 weeks.   Contact information:   [address] [place] [address] [phone]       Discharge Orders   Future Orders Complete By Expires     Call MD / Call 911  As directed     Comments:      If you experience chest pain or shortness of breath, CALL 911 and be transported to the hospital emergency room.  If you develope a fever above 101 F, pus (white drainage) or increased drainage or redness at the wound, or calf pain, call your surgeon's office.    Change dressing  As directed     Comments:      Maintain surgical dressing for 10-14 days, then change the dressing daily with sterile 4 x 4 inch gauze dressing and tape. Keep the area dry and clean.    Constipation Prevention  As directed     Comments:      Drink plenty of fluids.  Prune juice may be helpful.  You may use a stool softener, such as Colace (over the counter) 100 mg twice a day.  Use MiraLax (over the counter) for constipation as needed.    Diet - low sodium heart healthy  As directed     Discharge instructions  As directed     Comments:      Maintain surgical dressing for 10-14 days, then replace with gauze and tape. Keep the area dry and clean until follow up. Follow up in 2 weeks at [facility]. Call with any questions or concerns.    Driving restrictions  As directed     Comments:      No driving for 4 weeks    Increase activity slowly as tolerated  As directed     TED hose  As directed     Comments:      Use stockings (TED  hose) for 2 weeks on both leg(s).  You may remove them at night for sleeping.    Weight bearing as tolerated  As directed          Medication List    STOP taking these medications       HYDROcodone-acetaminophen 5-500 MG per tablet  Commonly known as:  VICODIN      TAKE these medications       aspirin 81 MG tablet  Take 81 mg by mouth every morning.     atorvastatin 20 MG tablet  Commonly known as:  LIPITOR  Take 20 mg by mouth at bedtime.     CALCIUM-VITAMIN D PO  Take 1 tablet by  mouth daily.     clopidogrel 75 MG tablet  Commonly known as:  PLAVIX  Take 75 mg by mouth every morning.     diphenhydrAMINE 25 MG tablet  Commonly known as:  BENADRYL  Take 50 mg by mouth at bedtime.     donepezil 10 MG tablet  Commonly known as:  ARICEPT  Take 20 mg by mouth every morning.     DSS 100 MG Caps  Take 100 mg by mouth 2 (two) times daily.     escitalopram 20 MG tablet  Commonly known as:  LEXAPRO  Take 20 mg by mouth every morning.     ferrous sulfate 325 (65 FE) MG tablet  Take 1 tablet (325 mg total) by mouth 3 (three) times daily after meals.     Fish Oil 1200 MG Caps  Take 2,400 mg by mouth daily.     LORazepam 1 MG tablet  Commonly known as:  ATIVAN  Take 1 mg by mouth 2 (two) times daily. 1 tablet in am and 2 tablets at hs     Lysine 500 MG Caps  Take 500 mg by mouth daily.     Melatonin 5 MG Tabs  Take 10 mg by mouth at bedtime.     montelukast 10 MG tablet  Commonly known as:  SINGULAIR  Take 10 mg by mouth every morning.     niacin 500 MG CR capsule  Take 500 mg by mouth at bedtime.     oxybutynin 5 MG tablet  Commonly known as:  DITROPAN  Take 5 mg by mouth daily.     pantoprazole 40 MG tablet  Commonly known as:  PROTONIX  Take 40 mg by mouth daily.     polyethylene glycol packet  Commonly known as:  MIRALAX / GLYCOLAX  Take 17 g by mouth 2 (two) times daily.     QUEtiapine 50 MG tablet  Commonly known as:  SEROQUEL  Take 150 mg by  mouth at bedtime.     sodium chloride 0.65 % nasal spray  Commonly known as:  OCEAN  Place 1 spray into the nose daily as needed for congestion.     tamsulosin 0.4 MG Caps  Commonly known as:  FLOMAX  Take 0.4 mg by mouth daily.     tiZANidine 4 MG capsule  Commonly known as:  ZANAFLEX  Take 1 capsule (4 mg total) by mouth 3 (three) times daily. Muscle spasms     traMADol 50 MG tablet  Commonly known as:  ULTRAM  Take 1-2 tablets (50-100 mg total) by mouth every 6 (six) hours as needed for pain.         Signed: Anastasio Auerbach. Babish   PAC  10/10/2012, 8:27 AM

## 2012-10-10 NOTE — Progress Notes (Signed)
Clinical Social Work Department CLINICAL SOCIAL WORK PLACEMENT NOTE 10/10/2012  Patient:  [name] [name], [name] [name]  Account Number:  [id number] Admit date:  10/07/2012  Clinical Social Worker:  Cori Razor, LCSW  Date/time:  10/09/2012 01:45 PM  Clinical Social Work is seeking post-discharge placement for this patient at the following level of care:   SKILLED NURSING   (*CSW will update this form in Epic as items are completed)   10/09/2012  Patient/family provided with [facility] Department of Clinical Social Work's list of facilities offering this level of care within the geographic area requested by the patient (or if unable, by the patient's family).  10/09/2012  Patient/family informed of their freedom to choose among providers that offer the needed level of care, that participate in Medicare, Medicaid or managed care program needed by the patient, have an available bed and are willing to accept the patient.  10/09/2012  Patient/family informed of [facility]' ownership interest in [facility], as well as of the fact that they are under no obligation to receive care at this facility.  PASARR submitted to EDS on 10/09/2012 PASARR number received from [employer] on   FL2 transmitted to all facilities in geographic area requested by pt/family on  10/09/2012 FL2 transmitted to all facilities within larger geographic area on   Patient informed that his/her managed care company has contracts with or will negotiate with  certain facilities, including the following:     Patient/family informed of bed offers received:  10/09/2012 Patient chooses bed at [facility] Physician recommends and patient chooses bed at    Patient to be transferred to [facility] on  10/10/2012 Patient to be transferred to facility by [facility]  The following physician request were entered in Epic:   Additional Comments:  Cori Razor LCSW [phone]

## 2012-10-10 NOTE — Progress Notes (Signed)
Pt to d/c to [facility]. Report given to Willadean Carol, RN.

## 2012-10-23 ENCOUNTER — Non-Acute Institutional Stay (SKILLED_NURSING_FACILITY): Payer: Medicare Other | Admitting: Internal Medicine

## 2012-10-23 ENCOUNTER — Encounter: Payer: Self-pay | Admitting: Internal Medicine

## 2012-10-23 DIAGNOSIS — M1711 Unilateral primary osteoarthritis, right knee: Secondary | ICD-10-CM

## 2012-10-23 DIAGNOSIS — M1710 Unilateral primary osteoarthritis, unspecified knee: Secondary | ICD-10-CM

## 2012-10-23 NOTE — Progress Notes (Signed)
Patient ID: [name] [name], male   DOB: December 01, 1940, 72 y.o.   MRN: [id number]  Chief complaint-discharge note  . History of present illness.  Patient is a very pleasant 72 year old male who is here for rehabilitation after undergoing a right knee replacement-apparently he had failed nonsurgical conservative interventions.  He underwent a right knee arthroplasty earlier this month on March 4.  Tolerated the procedure well apparently there were minimal complications.  I do note he had significant postoperative anemia of 7.4-he was started on iron initial hemoglobin obtained at this facility was 7.3 last week however update labs shows significant improvement his hemoglobin now is 9.2.  He did see his orthopedic surgeon today who has cleared him for discharg  His stay here has been similarly unremarkable-I did order a Doppler ultrasound of his right leg because of edema last week-this has returned negative.  He does have a listed history of dementia this appears to be moderate to mild gas has not really been an issue here he does continue on Aricept he is also on Seroquel apparently for some history of behaviors of psychosis-again this has not really been an issue here.  Tonight he has no complaints he is looking forward to going home.  He will be continuing therapy-he does have strong family support including a very supportive wife  Review of systems.  In general he denies any fever or chills says he feels well looking forward to going home.   skin denies problems surgical site right knee appears to be fairly benign.  Eyes-denies visual changes.  Respiratory-denies shortness breath or cough.  Cardiac denies any chest pain.  Muscle skeletal-denies joint pain knee pain tonight this appears to be controlled on current medications.  Neurologic-denies any headache or dizziness.  Psych does have a listed history of dementia this appears to be mild to moderate is pleasant.  Physical  exam.  In general this is a well-nourished elderly male in no distress lying comfortably in bed.  Skin is warm and dry.  Surgical site there is a healing surgical scar there is crusting there is some edema slight warmth but this appears to be of normal postsurgical changes-I do not see anything concerning-minimal erythema.  Steri-Strips are in place over the surgical site.  Chest chest is clear to auscultation without rhonchi rales or wheezes somewhat reduced breath sounds-he did have some slight congestion anterior fields but these cleared with cough.  Heart-regular rate and rhythm without murmur gallop or rub.  Abdomen soft nontender positive bowel sounds.  Extremities past surgical site is dressed about-he does have minimal right leg edema this appears improved pedal pulses intact.  Neurologic grossly intact.  Psych ----pleasant appropriate-.  Labs.  10/21/2012.  WBC 5.2 hemoglobin 9.2 platelets 511.  Iron studies appeared unremarkable serum iron 37 TIBC 262 B12 581 and folate 16  The   retic was elevated at 4.6.   10/14/2012.  Hemoglobin 7.3 platelets 212 WBC 6.7.  Sodium 136 potassium 4.1 BUN 13 creatinine 0.88.  Assessment and plan. Please #1-end-stage osteoarthritis right knee-status post replacement-he has done well with this he has been cleared for discharge by orthopedics-patient appears well controlled on Ultram.  #2-anemia-this was thought to be postop blood loss-he is responding well on iron will be followed by PCP.  #3 dementia-with behavior psychosis?-This was stable during his stay here continues on Aricept as well as Seroquel.  #4-coronary artery disease-continues on statin as well as aspirin as well as Plavix-this has not been an issue  during his stay here.  #5-anticoagulation-as stated above continues on aspirin as well as Plavix.  #6 hyperlipidemia continues on niacinr also on Lipitor--- will defer followup to primary care provider since his stay  here has been quite short.  #7-anxiety-this is stable on routine Ativan.  #8-depression-this appears stable he is on Lexapro.  #9-GERD-this is stable on Protonix.  Of note he will be going home with a very supportive wife will need continued therapy at home.  Also follow up with primary care provider.  [id number]             .  #7

## 2012-10-25 DIAGNOSIS — Z471 Aftercare following joint replacement surgery: Secondary | ICD-10-CM

## 2012-10-25 DIAGNOSIS — I2510 Atherosclerotic heart disease of native coronary artery without angina pectoris: Secondary | ICD-10-CM

## 2012-10-25 DIAGNOSIS — D649 Anemia, unspecified: Secondary | ICD-10-CM

## 2012-10-25 DIAGNOSIS — J4489 Other specified chronic obstructive pulmonary disease: Secondary | ICD-10-CM

## 2012-10-25 DIAGNOSIS — J449 Chronic obstructive pulmonary disease, unspecified: Secondary | ICD-10-CM

## 2012-11-17 ENCOUNTER — Telehealth (HOSPITAL_COMMUNITY): Payer: Self-pay | Admitting: Cardiology

## 2012-11-17 NOTE — Telephone Encounter (Signed)
Pt has knee replacement recently. At discharge Hg = 9. Hg rechecked at rehab facility = 7. Pt was also having recurrent nose bleeds. Seen in ER for this Was advised to d/c plavix by primary doctor. Pt would like to know when should he restart med

## 2012-11-17 NOTE — Telephone Encounter (Signed)
Will forward to Dr Bensimhon for review 

## 2012-11-18 NOTE — Telephone Encounter (Signed)
Can stay off of Plavix. Stents date back to 2007. Just use ASA 81

## 2012-11-19 NOTE — Telephone Encounter (Signed)
Left message to call back  

## 2012-11-20 NOTE — Telephone Encounter (Signed)
PT/ PTS WIFE AWARE

## 2012-11-24 ENCOUNTER — Ambulatory Visit: Payer: Medicare Other | Attending: Orthopedic Surgery | Admitting: Physical Therapy

## 2012-11-24 DIAGNOSIS — M25669 Stiffness of unspecified knee, not elsewhere classified: Secondary | ICD-10-CM | POA: Insufficient documentation

## 2012-11-24 DIAGNOSIS — M25569 Pain in unspecified knee: Secondary | ICD-10-CM | POA: Insufficient documentation

## 2012-11-24 DIAGNOSIS — R5381 Other malaise: Secondary | ICD-10-CM | POA: Insufficient documentation

## 2012-11-24 DIAGNOSIS — IMO0001 Reserved for inherently not codable concepts without codable children: Secondary | ICD-10-CM | POA: Insufficient documentation

## 2012-11-24 DIAGNOSIS — Z96659 Presence of unspecified artificial knee joint: Secondary | ICD-10-CM | POA: Insufficient documentation

## 2012-11-25 ENCOUNTER — Ambulatory Visit: Payer: Medicare Other | Admitting: Physical Therapy

## 2012-11-27 ENCOUNTER — Ambulatory Visit: Payer: Medicare Other | Admitting: Physical Therapy

## 2012-12-01 ENCOUNTER — Ambulatory Visit: Payer: Medicare Other | Admitting: *Deleted

## 2012-12-03 ENCOUNTER — Ambulatory Visit: Payer: Medicare Other | Admitting: Physical Therapy

## 2012-12-05 ENCOUNTER — Ambulatory Visit: Payer: Medicare Other | Attending: Orthopedic Surgery | Admitting: Physical Therapy

## 2012-12-05 DIAGNOSIS — IMO0001 Reserved for inherently not codable concepts without codable children: Secondary | ICD-10-CM | POA: Insufficient documentation

## 2012-12-05 DIAGNOSIS — Z96659 Presence of unspecified artificial knee joint: Secondary | ICD-10-CM | POA: Insufficient documentation

## 2012-12-05 DIAGNOSIS — M25669 Stiffness of unspecified knee, not elsewhere classified: Secondary | ICD-10-CM | POA: Insufficient documentation

## 2012-12-05 DIAGNOSIS — M25569 Pain in unspecified knee: Secondary | ICD-10-CM | POA: Insufficient documentation

## 2012-12-05 DIAGNOSIS — R5381 Other malaise: Secondary | ICD-10-CM | POA: Insufficient documentation

## 2012-12-08 ENCOUNTER — Ambulatory Visit: Payer: Medicare Other | Admitting: *Deleted

## 2012-12-10 ENCOUNTER — Ambulatory Visit: Payer: Medicare Other | Admitting: Physical Therapy

## 2012-12-12 ENCOUNTER — Ambulatory Visit: Payer: Medicare Other | Admitting: Physical Therapy

## 2012-12-16 ENCOUNTER — Ambulatory Visit: Payer: Medicare Other | Admitting: Physical Therapy

## 2012-12-18 ENCOUNTER — Ambulatory Visit: Payer: Medicare Other | Admitting: Physical Therapy

## 2012-12-23 ENCOUNTER — Ambulatory Visit: Payer: Medicare Other | Admitting: Physical Therapy

## 2012-12-25 ENCOUNTER — Encounter: Payer: Medicare Other | Admitting: Physical Therapy

## 2012-12-26 ENCOUNTER — Ambulatory Visit: Payer: Medicare Other | Admitting: Physical Therapy

## 2012-12-30 ENCOUNTER — Ambulatory Visit: Payer: Medicare Other | Admitting: Physical Therapy

## 2012-12-31 ENCOUNTER — Ambulatory Visit: Payer: Medicare Other | Admitting: Physical Therapy

## 2013-01-02 ENCOUNTER — Ambulatory Visit: Payer: Medicare Other | Admitting: Physical Therapy

## 2013-01-06 ENCOUNTER — Ambulatory Visit: Payer: Medicare Other | Attending: Orthopedic Surgery | Admitting: Physical Therapy

## 2013-01-06 DIAGNOSIS — M25569 Pain in unspecified knee: Secondary | ICD-10-CM | POA: Insufficient documentation

## 2013-01-06 DIAGNOSIS — M25669 Stiffness of unspecified knee, not elsewhere classified: Secondary | ICD-10-CM | POA: Insufficient documentation

## 2013-01-06 DIAGNOSIS — Z96659 Presence of unspecified artificial knee joint: Secondary | ICD-10-CM | POA: Insufficient documentation

## 2013-01-06 DIAGNOSIS — IMO0001 Reserved for inherently not codable concepts without codable children: Secondary | ICD-10-CM | POA: Insufficient documentation

## 2013-01-06 DIAGNOSIS — R5381 Other malaise: Secondary | ICD-10-CM | POA: Insufficient documentation

## 2013-01-13 ENCOUNTER — Ambulatory Visit: Payer: Medicare Other | Admitting: Physical Therapy

## 2013-01-15 ENCOUNTER — Ambulatory Visit: Payer: Medicare Other | Admitting: *Deleted

## 2013-01-19 ENCOUNTER — Ambulatory Visit: Payer: Medicare Other | Admitting: *Deleted

## 2013-01-21 ENCOUNTER — Ambulatory Visit: Payer: Medicare Other | Admitting: *Deleted

## 2013-01-23 ENCOUNTER — Ambulatory Visit: Payer: Medicare Other | Admitting: Physical Therapy

## 2013-01-26 ENCOUNTER — Ambulatory Visit: Payer: Medicare Other | Admitting: Physical Therapy

## 2013-01-28 ENCOUNTER — Encounter: Payer: Medicare Other | Admitting: Physical Therapy

## 2013-01-29 ENCOUNTER — Ambulatory Visit: Payer: Medicare Other | Admitting: Physical Therapy

## 2013-01-30 ENCOUNTER — Ambulatory Visit: Payer: Medicare Other | Admitting: Physical Therapy

## 2013-02-02 ENCOUNTER — Ambulatory Visit: Payer: Medicare Other | Admitting: Physical Therapy

## 2013-02-04 ENCOUNTER — Ambulatory Visit: Payer: Medicare Other | Attending: Orthopedic Surgery | Admitting: Physical Therapy

## 2013-02-04 DIAGNOSIS — IMO0001 Reserved for inherently not codable concepts without codable children: Secondary | ICD-10-CM | POA: Insufficient documentation

## 2013-02-04 DIAGNOSIS — Z96659 Presence of unspecified artificial knee joint: Secondary | ICD-10-CM | POA: Insufficient documentation

## 2013-02-04 DIAGNOSIS — M25669 Stiffness of unspecified knee, not elsewhere classified: Secondary | ICD-10-CM | POA: Insufficient documentation

## 2013-02-04 DIAGNOSIS — M25569 Pain in unspecified knee: Secondary | ICD-10-CM | POA: Insufficient documentation

## 2013-02-04 DIAGNOSIS — R5381 Other malaise: Secondary | ICD-10-CM | POA: Insufficient documentation

## 2013-03-16 ENCOUNTER — Encounter: Payer: Self-pay | Admitting: Cardiology

## 2013-03-16 ENCOUNTER — Ambulatory Visit (INDEPENDENT_AMBULATORY_CARE_PROVIDER_SITE_OTHER): Payer: Medicare Other | Admitting: Cardiology

## 2013-03-16 VITALS — BP 125/74 | HR 55 | Ht 68.0 in | Wt 164.8 lb

## 2013-03-16 DIAGNOSIS — I658 Occlusion and stenosis of other precerebral arteries: Secondary | ICD-10-CM

## 2013-03-16 DIAGNOSIS — Z8679 Personal history of other diseases of the circulatory system: Secondary | ICD-10-CM

## 2013-03-16 DIAGNOSIS — I6523 Occlusion and stenosis of bilateral carotid arteries: Secondary | ICD-10-CM

## 2013-03-16 DIAGNOSIS — I6529 Occlusion and stenosis of unspecified carotid artery: Secondary | ICD-10-CM

## 2013-03-16 DIAGNOSIS — I2510 Atherosclerotic heart disease of native coronary artery without angina pectoris: Secondary | ICD-10-CM

## 2013-03-16 DIAGNOSIS — E785 Hyperlipidemia, unspecified: Secondary | ICD-10-CM

## 2013-03-16 DIAGNOSIS — I10 Essential (primary) hypertension: Secondary | ICD-10-CM

## 2013-03-16 NOTE — Assessment & Plan Note (Signed)
No palpitations, history of previous ablation.

## 2013-03-16 NOTE — Assessment & Plan Note (Signed)
No active angina symptoms. History reviewed including DES to the LAD and circumflex in 2006. ECG today shows no significant changes. He had a GXT in April 2013 that was negative. Would not anticipate any further cardiac testing at this time. We will establish a followup visit in 6 months.

## 2013-03-16 NOTE — Progress Notes (Signed)
Clinical Summary Mr. Garry is a medically complex 72 y.o.male presenting to the office for a followup visit. He is a former patient of Dr. Gala Romney - last office visit was in 2012. Records reviewed. He underwent right knee arthroplasty in March of this year.  GXT noted from April 2013, reported as negative for ischemia. He reports no angina symptoms. 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 There was some question as to whether he needed a followup stress test. Not certain that they were aware he had just had one last April.  ECG today shows sinus bradycardia, no acute ST segment changes.  He is not certain when he had his lipids last assessed.  No Known Allergies  Current Outpatient Prescriptions  Medication Sig Dispense Refill  . aspirin 81 MG tablet Take 81 mg by mouth every morning.       Marland Kitchen atorvastatin (LIPITOR) 20 MG tablet Take 20 mg by mouth at bedtime.       Marland Kitchen CALCIUM-VITAMIN D PO Take 1 tablet by mouth daily.      Marland Kitchen donepezil (ARICEPT) 10 MG tablet Take 20 mg by mouth every morning.       . escitalopram (LEXAPRO) 20 MG tablet Take 10 mg by mouth every morning.       Marland Kitchen HYDROcodone-acetaminophen (VICODIN) 5-500 MG per tablet Take 1 tablet by mouth every 6 (six) hours as needed.       Marland Kitchen LORazepam (ATIVAN) 1 MG tablet Take 1 mg by mouth 2 (two) times daily. 1 tablet in am and 2 tablets at hs      . Lysine 500 MG CAPS Take 500 mg by mouth daily.       . Melatonin 5 MG TABS Take 10 mg by mouth at bedtime.      . montelukast (SINGULAIR) 10 MG tablet Take 10 mg by mouth every morning.      . niacin 500 MG CR capsule Take 1,000 mg by mouth at bedtime.       . Omega-3 Fatty Acids (FISH OIL) 1200 MG CAPS Take 2,400 mg by mouth daily.      Marland Kitchen oxybutynin (DITROPAN) 5 MG tablet Take 5 mg by mouth daily.      . pantoprazole (PROTONIX) 40 MG tablet Take 40 mg by mouth daily.       . polyethylene glycol (MIRALAX / GLYCOLAX) packet Take 17 g by mouth 2 (two)  times daily.  14 each    . QUEtiapine (SEROQUEL) 50 MG tablet Take 150 mg by mouth at bedtime.      . Tamsulosin HCl (FLOMAX) 0.4 MG CAPS Take 0.4 mg by mouth daily.       Marland Kitchen tiZANidine (ZANAFLEX) 4 MG capsule Take 4 mg by mouth as needed. Muscle spasms      . traMADol (ULTRAM) 50 MG tablet Take 1-2 tablets (50-100 mg total) by mouth every 6 (six) hours as needed for pain.  100 tablet  0   No current facility-administered medications for this visit.    Past Medical History  Diagnosis Date  . Coronary atherosclerosis of native coronary artery     DES to LAD and circumflex 2006  . Essential hypertension, benign   . Hyperlipidemia   . SVT (supraventricular tachycardia)     Status post RFA  . Sleep apnea   . Anxiety   . Glucose intolerance (impaired glucose tolerance)   . Osteoarthritis   . Carotid stenosis   .  Long-term memory loss     Aricept and Seroquel  . GERD (gastroesophageal reflux disease)   . Hemorrhoids   . History of colon polyps   . Depression   . Insomnia   . COPD (chronic obstructive pulmonary disease)   . Skin cancer   . Nosebleed     09/15/12 - Dr Josephina Shih, ENT   . Dementia     Dr Daphane Shepherd - Neurology    Social History Mr. Balthazor reports that he quit smoking about 13 years ago. His smoking use included Cigarettes. He smoked 0.00 packs per day. He has never used smokeless tobacco. Mr. Riendeau reports that he does not drink alcohol.  Review of Systems No palpitations, dizziness, syncope. Ambulating well status post right knee replacement. No angina symptoms. Stable appetite. No reported bleeding problems. Otherwise negative.  Physical Examination Filed Vitals:   03/16/13 1514  BP: 125/74  Pulse: 55   Filed Weights   03/16/13 1514  Weight: 164 lb 12 oz (74.73 kg)   Appears comfortable at rest. HEENT: Conjunctiva and lids normal, oropharynx clear. Neck: Supple, no elevated JVP, bilateral carotid bruits, no thyromegaly. Lungs: Clear to auscultation,  nonlabored breathing at rest. Cardiac: Regular rate and rhythm, no S3 or significant systolic murmur, no pericardial rub. Abdomen: Soft, nontender, bowel sounds present. Extremities: No pitting edema, distal pulses 2+. Skin: Warm and dry. Musculoskeletal: No kyphosis. Neuropsychiatric: Alert and oriented x3, affect grossly appropriate.   Problem List and Plan   CAD, NATIVE VESSEL No active angina symptoms. History reviewed including DES to the LAD and circumflex in 2006. ECG today shows no significant changes. He had a GXT in April 2013 that was negative. Would not anticipate any further cardiac testing at this time. We will establish a followup visit in 6 months.  HYPERLIPIDEMIA Followup FLP and LFT.  HYPERTENSION, BENIGN Blood pressure control is reasonable today.  Bilateral carotid artery occlusion Records indicate prior followup by a physician in [place]. Patient states he is now followed by Dr. Charm Barges. Could not locate any recent carotid Dopplers.  History of PSVT (paroxysmal supraventricular tachycardia) No palpitations, history of previous ablation.    Jonelle Sidle, M.D., F.A.C.C.

## 2013-03-16 NOTE — Patient Instructions (Addendum)
Your physician recommends that you schedule a follow-up appointment in: 6 MONTHS WITH DR SM  Your physician recommends that you return for lab work in: THIS WEEK (SLIPS GIVEN FOR LIVER AND LIPIDS)

## 2013-03-16 NOTE — Assessment & Plan Note (Signed)
Followup FLP and LFT. 

## 2013-03-16 NOTE — Assessment & Plan Note (Signed)
Blood pressure control is reasonable today. 

## 2013-03-16 NOTE — Assessment & Plan Note (Signed)
Records indicate prior followup by a physician in [place]. Patient states he is now followed by Dr. Charm Barges. Could not locate any recent carotid Dopplers.

## 2013-03-20 ENCOUNTER — Encounter: Payer: Self-pay | Admitting: Cardiology

## 2013-11-13 ENCOUNTER — Other Ambulatory Visit (HOSPITAL_COMMUNITY): Payer: Self-pay | Admitting: Orthopedic Surgery

## 2013-11-13 DIAGNOSIS — M25561 Pain in right knee: Secondary | ICD-10-CM

## 2013-11-16 ENCOUNTER — Encounter (HOSPITAL_COMMUNITY): Payer: Self-pay

## 2013-11-16 ENCOUNTER — Encounter (HOSPITAL_COMMUNITY)
Admission: RE | Admit: 2013-11-16 | Discharge: 2013-11-16 | Disposition: A | Discharge status: Home / Self Care | Payer: Medicare Other | Source: Ambulatory Visit | Attending: Orthopedic Surgery | Admitting: Orthopedic Surgery

## 2013-11-16 DIAGNOSIS — M25561 Pain in right knee: Secondary | ICD-10-CM

## 2013-11-16 DIAGNOSIS — M25569 Pain in unspecified knee: Secondary | ICD-10-CM | POA: Insufficient documentation

## 2013-11-16 MED ORDER — TECHNETIUM TC 99M MEDRONATE IV KIT
25.0000 | PACK | Freq: Once | INTRAVENOUS | Status: AC | PRN
  Administered 2013-11-16: 25 via INTRAVENOUS

## 2014-04-07 ENCOUNTER — Ambulatory Visit: Payer: Medicare Other | Admitting: Cardiology

## 2014-07-06 ENCOUNTER — Ambulatory Visit (INDEPENDENT_AMBULATORY_CARE_PROVIDER_SITE_OTHER): Payer: Medicare Other | Admitting: Internal Medicine

## 2014-07-06 ENCOUNTER — Encounter: Payer: Self-pay | Admitting: Internal Medicine

## 2014-07-06 VITALS — BP 110/70 | HR 73 | Ht 68.0 in | Wt 162.0 lb

## 2014-07-06 DIAGNOSIS — R918 Other nonspecific abnormal finding of lung field: Secondary | ICD-10-CM

## 2014-07-06 NOTE — Progress Notes (Signed)
Subjective:    Patient ID: [name] [name], male    DOB: October 20, 1940  MRN: [id number]  HPI  18 yowm quit smoking around 2000 no trouble at all except for little coughing until  Winter of 2015 then sob in summer 2015 still did yard work then cxr done > referred 07/06/2014 to pulmonary clinic by Dr Melina Copa  For abn cxr > CT c/w bronchiectasis/ MAI can't r/o tumor.    07/06/2014 1st [facility] office visit/ Zakaria Sedor   Chief Complaint  Patient presents with  . Pulmonary Consult    Referred by Dr. Octavio Graves for eval of lung mass. Pt c/o cough for the past 6-12 months- prod with minimal light yellow sputum. He also c/o DOE with walking minimal distances such as from room to room at home. He also c/o poor appetite.   onset of symptoms was indolent, min progressive, assoc with gen cognitive decline per wife and pt not able to directly remember any of his symptoms and tends to minimize them.  Some difficulty swallowing but no frank asp, no pleuritic cp or hemoptysis  No obvious other patterns in day to day or daytime variabilty or assoc   cp or chest tightness, subjective wheeze overt sinus or hb symptoms. No unusual exp hx or h/o childhood pna/ asthma or knowledge of premature birth.  Sleeping ok without nocturnal  or early am exacerbation  of respiratory  c/o's or need for noct saba. Also denies any obvious fluctuation of symptoms with weather or environmental changes or other aggravating or alleviating factors except as outlined above   Current Medications, Allergies, Complete Past Medical History, Past Surgical History, Family History, and Social History were reviewed in [employer] record.             Review of Systems  Constitutional: Positive for appetite change and unexpected weight change. Negative for fever, chills and activity change.  HENT: Positive for trouble swallowing. Negative for congestion, dental problem, postnasal drip, rhinorrhea, sneezing, sore  throat and voice change.   Eyes: Negative for visual disturbance.  Respiratory: Positive for cough and shortness of breath. Negative for choking.   Cardiovascular: Negative for chest pain and leg swelling.  Gastrointestinal: Negative for nausea, vomiting and abdominal pain.  Genitourinary: Negative for difficulty urinating.  Musculoskeletal: Positive for arthralgias.  Skin: Negative for rash.  Psychiatric/Behavioral: Negative for behavioral problems and confusion.       Objective:   Physical Exam  Alert but slow / inaccurate with responses   Wt Readings from Last 3 Encounters:  07/06/14 162 lb (73.483 kg)  03/16/13 164 lb 12 oz (74.73 kg)  10/07/12 168 lb (76.204 kg)    Vital signs reviewed    HEENT: nl dentition, turbinates, and orophanx. Nl external ear canals without cough reflex   NECK :  without JVD/Nodes/TM/ nl carotid upstrokes bilaterally   LUNGS: no acc muscle use, clear to A and P bilaterally without cough on insp or exp maneuvers   CV:  RRR  no s3 or murmur or increase in P2, no edema   ABD:  soft and nontender with nl excursion in the supine position. No bruits or organomegaly, bowel sounds nl  MS:  warm without deformities, calf tenderness, cyanosis or clubbing  SKIN: warm and dry without lesions    NEURO:  alert, approp, no deficits    CXR 06/22/14 new RLL density   CT 06/24/14 bronchiectasis with dense patches RLL sup and post basal segment  Assessment & Plan:

## 2014-07-06 NOTE — Patient Instructions (Addendum)
Continue protonix 40 Take 30-60 min before first meal of the day and add pepcid 20 mg at bedtime   GERD (REFLUX)  is an extremely common cause of respiratory symptoms just like yours , many times with no obvious heartburn at all.    It can be treated with medication, but also with lifestyle changes including avoidance of late meals, excessive alcohol, smoking cessation, and avoid fatty foods, chocolate, peppermint, colas, red wine, and acidic juices such as orange juice.  NO MINT OR MENTHOL PRODUCTS SO NO COUGH DROPS  USE SUGARLESS CANDY INSTEAD (Jolley ranchers or Stover's or Life Savers) or even ice chips will also do - the key is to swallow to prevent all throat clearing. NO OIL BASED VITAMINS - use powdered substitutes.  Come to outpatient registration at [facility] (behind the ER) at 715 am Tuesday  07/13/14 with nothing to eat or drink after midnight Monday .for Bronchoscopy and no aspirin for 3 days prior  Late add bx RLL postbasal segment

## 2014-07-08 DIAGNOSIS — R918 Other nonspecific abnormal finding of lung field: Secondary | ICD-10-CM | POA: Insufficient documentation

## 2014-07-08 NOTE — Assessment & Plan Note (Signed)
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

## 2014-07-12 ENCOUNTER — Telehealth: Payer: Self-pay | Admitting: Internal Medicine

## 2014-07-12 NOTE — Telephone Encounter (Signed)
I spoke with the pt spouse and she states that the bronch lab called and gave instructions on what meds to take. Nothing further needed. [place] Bing, CMA

## 2014-07-13 ENCOUNTER — Inpatient Hospital Stay (HOSPITAL_COMMUNITY)
Admission: RE | Admit: 2014-07-13 | Discharge: 2014-07-15 | DRG: 165 | Disposition: A | Discharge status: Skilled Nursing Facility | Payer: Medicare Other | Source: Ambulatory Visit | Attending: Internal Medicine | Admitting: Internal Medicine

## 2014-07-13 ENCOUNTER — Ambulatory Visit (HOSPITAL_COMMUNITY)
Admission: RE | Admit: 2014-07-13 | Discharge: 2014-07-13 | Disposition: A | Discharge status: Home / Self Care | Payer: Medicare Other | Source: Ambulatory Visit | Attending: Internal Medicine | Admitting: Internal Medicine

## 2014-07-13 ENCOUNTER — Encounter (HOSPITAL_COMMUNITY)
Admission: RE | Disposition: A | Discharge status: Skilled Nursing Facility | Payer: Medicare Other | Source: Ambulatory Visit | Attending: Internal Medicine

## 2014-07-13 ENCOUNTER — Ambulatory Visit (HOSPITAL_COMMUNITY): Payer: Medicare Other

## 2014-07-13 ENCOUNTER — Inpatient Hospital Stay (HOSPITAL_COMMUNITY): Payer: Medicare Other

## 2014-07-13 ENCOUNTER — Encounter (HOSPITAL_COMMUNITY): Payer: Self-pay | Admitting: *Deleted

## 2014-07-13 DIAGNOSIS — I2510 Atherosclerotic heart disease of native coronary artery without angina pectoris: Secondary | ICD-10-CM | POA: Diagnosis present

## 2014-07-13 DIAGNOSIS — G4700 Insomnia, unspecified: Secondary | ICD-10-CM | POA: Diagnosis present

## 2014-07-13 DIAGNOSIS — Z86010 Personal history of colonic polyps: Secondary | ICD-10-CM

## 2014-07-13 DIAGNOSIS — F0390 Unspecified dementia without behavioral disturbance: Secondary | ICD-10-CM | POA: Diagnosis present

## 2014-07-13 DIAGNOSIS — Z7982 Long term (current) use of aspirin: Secondary | ICD-10-CM | POA: Diagnosis not present

## 2014-07-13 DIAGNOSIS — E785 Hyperlipidemia, unspecified: Secondary | ICD-10-CM | POA: Diagnosis present

## 2014-07-13 DIAGNOSIS — F419 Anxiety disorder, unspecified: Secondary | ICD-10-CM | POA: Diagnosis present

## 2014-07-13 DIAGNOSIS — R1310 Dysphagia, unspecified: Secondary | ICD-10-CM | POA: Diagnosis present

## 2014-07-13 DIAGNOSIS — K219 Gastro-esophageal reflux disease without esophagitis: Secondary | ICD-10-CM | POA: Diagnosis present

## 2014-07-13 DIAGNOSIS — Z9889 Other specified postprocedural states: Secondary | ICD-10-CM

## 2014-07-13 DIAGNOSIS — Z87891 Personal history of nicotine dependence: Secondary | ICD-10-CM

## 2014-07-13 DIAGNOSIS — J449 Chronic obstructive pulmonary disease, unspecified: Secondary | ICD-10-CM | POA: Diagnosis present

## 2014-07-13 DIAGNOSIS — J95811 Postprocedural pneumothorax: Secondary | ICD-10-CM | POA: Diagnosis present

## 2014-07-13 DIAGNOSIS — J479 Bronchiectasis, uncomplicated: Secondary | ICD-10-CM | POA: Diagnosis present

## 2014-07-13 DIAGNOSIS — M1990 Unspecified osteoarthritis, unspecified site: Secondary | ICD-10-CM | POA: Diagnosis present

## 2014-07-13 DIAGNOSIS — Z85828 Personal history of other malignant neoplasm of skin: Secondary | ICD-10-CM

## 2014-07-13 DIAGNOSIS — J939 Pneumothorax, unspecified: Secondary | ICD-10-CM | POA: Diagnosis present

## 2014-07-13 DIAGNOSIS — I10 Essential (primary) hypertension: Secondary | ICD-10-CM | POA: Diagnosis present

## 2014-07-13 DIAGNOSIS — G4730 Sleep apnea, unspecified: Secondary | ICD-10-CM | POA: Diagnosis present

## 2014-07-13 DIAGNOSIS — R918 Other nonspecific abnormal finding of lung field: Secondary | ICD-10-CM | POA: Diagnosis present

## 2014-07-13 DIAGNOSIS — R05 Cough: Secondary | ICD-10-CM | POA: Diagnosis present

## 2014-07-13 HISTORY — PX: VIDEO BRONCHOSCOPY: SHX5072

## 2014-07-13 LAB — COMPREHENSIVE METABOLIC PANEL
ALT: 11 U/L (ref 0–53)
ANION GAP: 13 (ref 5–15)
AST: 13 U/L (ref 0–37)
Albumin: 3.3 g/dL — ABNORMAL LOW (ref 3.5–5.2)
Alkaline Phosphatase: 66 U/L (ref 39–117)
BILIRUBIN TOTAL: 0.3 mg/dL (ref 0.3–1.2)
BUN: 18 mg/dL (ref 6–23)
CALCIUM: 9.2 mg/dL (ref 8.4–10.5)
CHLORIDE: 109 meq/L (ref 96–112)
CO2: 22 meq/L (ref 19–32)
CREATININE: 1.04 mg/dL (ref 0.50–1.35)
GFR, EST AFRICAN AMERICAN: 81 mL/min — AB (ref >90)
GFR, EST NON AFRICAN AMERICAN: 70 mL/min — AB (ref >90)
GLUCOSE: 97 mg/dL (ref 70–99)
Potassium: 4 mEq/L (ref 3.7–5.3)
Sodium: 144 mEq/L (ref 137–147)
Total Protein: 6.7 g/dL (ref 6.0–8.3)

## 2014-07-13 LAB — PHOSPHORUS: PHOSPHORUS: 3.1 mg/dL (ref 2.3–4.6)

## 2014-07-13 LAB — MRSA PCR SCREENING: MRSA by PCR: NEGATIVE

## 2014-07-13 LAB — CBC
HCT: 36.9 % — ABNORMAL LOW (ref 39.0–52.0)
Hemoglobin: 11.7 g/dL — ABNORMAL LOW (ref 13.0–17.0)
MCH: 26.1 pg (ref 26.0–34.0)
MCHC: 31.7 g/dL (ref 30.0–36.0)
MCV: 82.2 fL (ref 78.0–100.0)
PLATELETS: 151 10*3/uL (ref 150–400)
RBC: 4.49 MIL/uL (ref 4.22–5.81)
RDW: 15.5 % (ref 11.5–15.5)
WBC: 7.3 10*3/uL (ref 4.0–10.5)

## 2014-07-13 LAB — MAGNESIUM: MAGNESIUM: 1.9 mg/dL (ref 1.5–2.5)

## 2014-07-13 SURGERY — BRONCHOSCOPY, WITH FLUOROSCOPY
Anesthesia: Moderate Sedation | Laterality: Bilateral

## 2014-07-13 MED ORDER — PHENYLEPHRINE HCL 0.25 % NA SOLN: 1.0000 | Freq: Four times a day (QID) | NASAL | Status: DC | PRN

## 2014-07-13 MED ORDER — LORAZEPAM 1 MG PO TABS
2.0000 mg | ORAL_TABLET | Freq: Every day | ORAL | Status: DC
  Administered 2014-07-13 – 2014-07-14 (×2): 2 mg via ORAL
  Filled 2014-07-13 (×2): qty 2

## 2014-07-13 MED ORDER — MIDAZOLAM HCL 10 MG/2ML IJ SOLN
INTRAMUSCULAR | Status: AC
  Filled 2014-07-13: qty 4

## 2014-07-13 MED ORDER — MONTELUKAST SODIUM 10 MG PO TABS
10.0000 mg | ORAL_TABLET | Freq: Every morning | ORAL | Status: DC
  Administered 2014-07-13 – 2014-07-15 (×3): 10 mg via ORAL
  Filled 2014-07-13 (×3): qty 1

## 2014-07-13 MED ORDER — VITAMIN B-12 1000 MCG PO TABS
1000.0000 ug | ORAL_TABLET | Freq: Every day | ORAL | Status: DC
  Administered 2014-07-13 – 2014-07-15 (×3): 1000 ug via ORAL
  Filled 2014-07-13 (×3): qty 1

## 2014-07-13 MED ORDER — DONEPEZIL HCL 10 MG PO TABS
30.0000 mg | ORAL_TABLET | Freq: Every day | ORAL | Status: DC
  Administered 2014-07-13 – 2014-07-14 (×2): 30 mg via ORAL
  Filled 2014-07-13 (×2): qty 3

## 2014-07-13 MED ORDER — MEPERIDINE HCL 100 MG/ML IJ SOLN
INTRAMUSCULAR | Status: AC
  Filled 2014-07-13: qty 2

## 2014-07-13 MED ORDER — QUETIAPINE FUMARATE 100 MG PO TABS
200.0000 mg | ORAL_TABLET | Freq: Every day | ORAL | Status: DC
  Administered 2014-07-13 – 2014-07-14 (×2): 200 mg via ORAL
  Filled 2014-07-13 (×2): qty 2

## 2014-07-13 MED ORDER — PANTOPRAZOLE SODIUM 40 MG PO TBEC
40.0000 mg | DELAYED_RELEASE_TABLET | Freq: Every day | ORAL | Status: DC
  Administered 2014-07-14 – 2014-07-15 (×2): 40 mg via ORAL
  Filled 2014-07-13 (×2): qty 1

## 2014-07-13 MED ORDER — TAMSULOSIN HCL 0.4 MG PO CAPS
0.4000 mg | ORAL_CAPSULE | Freq: Every day | ORAL | Status: DC
  Administered 2014-07-13 – 2014-07-15 (×3): 0.4 mg via ORAL
  Filled 2014-07-13 (×3): qty 1

## 2014-07-13 MED ORDER — SODIUM CHLORIDE 0.9 % IV SOLN
INTRAVENOUS | Status: DC
  Administered 2014-07-13: 08:00:00 via INTRAVENOUS

## 2014-07-13 MED ORDER — SODIUM CHLORIDE 0.9 % IV SOLN
INTRAVENOUS | Status: DC
  Administered 2014-07-13: 75 mL/h via INTRAVENOUS

## 2014-07-13 MED ORDER — MIDAZOLAM HCL 10 MG/2ML IJ SOLN
INTRAMUSCULAR | Status: DC | PRN
  Administered 2014-07-13: 2.5 mg via INTRAVENOUS

## 2014-07-13 MED ORDER — LORAZEPAM 1 MG PO TABS
1.0000 mg | ORAL_TABLET | Freq: Every day | ORAL | Status: DC
  Administered 2014-07-14 – 2014-07-15 (×2): 1 mg via ORAL
  Filled 2014-07-13 (×2): qty 1

## 2014-07-13 MED ORDER — ATORVASTATIN CALCIUM 10 MG PO TABS
20.0000 mg | ORAL_TABLET | Freq: Every day | ORAL | Status: DC
  Administered 2014-07-13 – 2014-07-14 (×2): 20 mg via ORAL
  Filled 2014-07-13 (×2): qty 2

## 2014-07-13 MED ORDER — LIDOCAINE HCL 1 % IJ SOLN
INTRAMUSCULAR | Status: DC | PRN
  Administered 2014-07-13: 6 mL via RESPIRATORY_TRACT

## 2014-07-13 MED ORDER — MIDAZOLAM HCL 2 MG/2ML IJ SOLN
INTRAMUSCULAR | Status: AC
  Filled 2014-07-13: qty 2

## 2014-07-13 MED ORDER — OXYBUTYNIN CHLORIDE 5 MG PO TABS
5.0000 mg | ORAL_TABLET | Freq: Every day | ORAL | Status: DC
  Administered 2014-07-13 – 2014-07-15 (×3): 5 mg via ORAL
  Filled 2014-07-13 (×3): qty 1

## 2014-07-13 MED ORDER — MIDAZOLAM HCL 10 MG/2ML IJ SOLN: 1.0000 mg | Freq: Once | INTRAMUSCULAR | Status: DC

## 2014-07-13 MED ORDER — MIDAZOLAM HCL 2 MG/2ML IJ SOLN
INTRAMUSCULAR | Status: AC
  Administered 2014-07-13: 1 mg
  Filled 2014-07-13: qty 2

## 2014-07-13 MED ORDER — FENTANYL CITRATE 0.05 MG/ML IJ SOLN
INTRAMUSCULAR | Status: AC
  Administered 2014-07-13: 50 ug
  Filled 2014-07-13: qty 2

## 2014-07-13 MED ORDER — NIACIN ER 500 MG PO CPCR
500.0000 mg | ORAL_CAPSULE | Freq: Every day | ORAL | Status: DC
  Administered 2014-07-13 – 2014-07-14 (×2): 500 mg via ORAL
  Filled 2014-07-13 (×3): qty 1

## 2014-07-13 MED ORDER — LIDOCAINE HCL 2 % EX GEL
1.0000 | Freq: Once | CUTANEOUS | Status: DC
Start: 2014-07-13 — End: 2014-07-13

## 2014-07-13 MED ORDER — PHENYLEPHRINE HCL 0.25 % NA SOLN
NASAL | Status: DC | PRN
  Administered 2014-07-13: 2 via NASAL

## 2014-07-13 MED ORDER — MEPERIDINE HCL 100 MG/ML IJ SOLN: 100.0000 mg | Freq: Once | INTRAMUSCULAR | Status: DC

## 2014-07-13 MED ORDER — FENTANYL CITRATE 0.05 MG/ML IJ SOLN
25.0000 ug | INTRAMUSCULAR | Status: DC | PRN
  Administered 2014-07-13 – 2014-07-14 (×3): 50 ug via INTRAVENOUS
  Filled 2014-07-13 (×3): qty 2

## 2014-07-13 MED ORDER — ESCITALOPRAM OXALATE 20 MG PO TABS
10.0000 mg | ORAL_TABLET | Freq: Every morning | ORAL | Status: DC
  Administered 2014-07-13 – 2014-07-15 (×3): 10 mg via ORAL
  Filled 2014-07-13 (×3): qty 1

## 2014-07-13 MED ORDER — LIDOCAINE HCL 2 % EX GEL
CUTANEOUS | Status: DC | PRN
  Administered 2014-07-13: 1

## 2014-07-13 MED ORDER — ALBUTEROL SULFATE (2.5 MG/3ML) 0.083% IN NEBU: 2.5000 mg | INHALATION_SOLUTION | RESPIRATORY_TRACT | Status: DC | PRN

## 2014-07-13 MED ORDER — MEPERIDINE HCL 25 MG/ML IJ SOLN
INTRAMUSCULAR | Status: DC | PRN
  Administered 2014-07-13: 50 mg via INTRAVENOUS

## 2014-07-13 NOTE — Plan of Care (Signed)
Problem: Consults Goal: General Medical Patient Education See Patient Education Module for specific education.  Outcome: Progressing     

## 2014-07-13 NOTE — Plan of Care (Signed)
Problem: Phase I Progression Outcomes Goal: Pain controlled with appropriate interventions Outcome: Completed/Met Date Met:  07/13/14 Goal: OOB as tolerated unless otherwise ordered Outcome: Completed/Met Date Met:  07/13/14 Goal: Voiding-avoid urinary catheter unless indicated Outcome: Completed/Met Date Met:  07/13/14 Goal: Hemodynamically stable Outcome: Completed/Met Date Met:  07/13/14  Problem: Phase II Progression Outcomes Goal: Vital signs remain stable Outcome: Completed/Met Date Met:  07/13/14  Problem: Discharge Progression Outcomes Goal: Pain controlled with appropriate interventions Outcome: Completed/Met Date Met:  07/13/14 Goal: Hemodynamically stable Outcome: Completed/Met Date Met:  07/13/14 Goal: Tolerating diet Outcome: Completed/Met Date Met:  07/13/14

## 2014-07-13 NOTE — H&P (Signed)
Patient ID: [name] [name], male DOB: October 30, 1940 MRN: [id number]   Brief patient profile:  12 yowm quit smoking around 2000 no trouble at all except for little coughing until Winter of 2015 then sob in summer 2015 still did yard work then cxr done > referred 07/06/2014 to pulmonary clinic by Dr Melina Copa For abn cxr > CT c/w bronchiectasis/ MAI can't r/o tumor.   History of Present Illness  07/06/2014 1st [facility] office visit/ Alona Danford  Chief Complaint  Patient presents with  . Pulmonary Consult    Referred by Dr. Octavio Graves for eval of lung mass. Pt c/o cough for the past 6-12 months- prod with minimal light yellow sputum. He also c/o DOE with walking minimal distances such as from room to room at home. He also c/o poor appetite.   onset of symptoms was indolent, min progressive, assoc with gen cognitive decline per wife and pt not able to directly remember any of his symptoms and tends to minimize them. Some difficulty swallowing but no frank asp, no pleuritic cp or hemoptysis  No obvious other patterns in day to day or daytime variabilty or assoc cp or chest tightness, subjective wheeze overt sinus or hb symptoms. No unusual exp hx or h/o childhood pna/ asthma or knowledge of premature birth.  Sleeping ok without nocturnal or early am exacerbation of respiratory c/o's or need for noct saba. Also denies any obvious fluctuation of symptoms with weather or environmental changes or other aggravating or alleviating factors except as outlined above   Current Medications, Allergies, Complete Past Medical History, Past Surgical History, Family History, and Social History were reviewed in [employer] record.        Review of Systems  Constitutional: Positive for appetite change and unexpected weight change. Negative for fever, chills and activity change.  HENT: Positive for trouble swallowing. Negative for congestion, dental problem, postnasal  drip, rhinorrhea, sneezing, sore throat and voice change.  Eyes: Negative for visual disturbance.  Respiratory: Positive for cough and shortness of breath. Negative for choking.  Cardiovascular: Negative for chest pain and leg swelling.  Gastrointestinal: Negative for nausea, vomiting and abdominal pain.  Genitourinary: Negative for difficulty urinating.  Musculoskeletal: Positive for arthralgias.  Skin: Negative for rash.  Psychiatric/Behavioral: Negative for behavioral problems and confusion.       Objective:   Physical Exam  Alert but slow / inaccurate with responses   Wt Readings from Last 3 Encounters:  07/06/14 162 lb (73.483 kg)  03/16/13 164 lb 12 oz (74.73 kg)  10/07/12 168 lb (76.204 kg)    Vital signs reviewed    HEENT: nl dentition, turbinates, and orophanx. Nl external ear canals without cough reflex   NECK : without JVD/Nodes/TM/ nl carotid upstrokes bilaterally   LUNGS: no acc muscle use, clear to A and P bilaterally without cough on insp or exp maneuvers   CV: RRR no s3 or murmur or increase in P2, no edema   ABD: soft and nontender with nl excursion in the supine position. No bruits or organomegaly, bowel sounds nl  MS: warm without deformities, calf tenderness, cyanosis or clubbing  SKIN: warm and dry without lesions   NEURO: alert, approp, no deficits   CXR 06/22/14 new RLL density   CT 06/24/14 bronchiectasis with dense patches RLL sup and post basal segment      Assessment & Plan:            Revision History       Date/Time  User Action    > 07/08/2014 6:41 AM Tanda Rockers, MD Sign     07/06/2014 10:26 AM Rosana Berger, CMA Sign at close encounter              Pulmonary infiltrates - Tanda Rockers, MD at 07/08/2014 6:40 AM     Status: Written Related Problem: Pulmonary infiltrates   Expand All Collapse All   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                                                                                         07/13/2014 day of FOB, no change in hx per wife or exam  Christinia Gully, MD Pulmonary and [place] [phone] After 5:30 PM or weekends, call [phone]

## 2014-07-13 NOTE — Progress Notes (Signed)
CARE MANAGEMENT NOTE 07/13/2014  Patient:  [name] [name], [name] [name]   Account Number:  [id number]  Date Initiated:  07/13/2014  Documentation initiated by:  Ruby Logiudice  Subjective/Objective Assessment:   pneumothorax s/p bronchscopy  Chest Tube Insertion Procedure Note  Indications:  Clinically significant Pneumothorax  Pre-operative Diagnosis: Pneumothorax  Post-operative Diagnosis: Pneumothorax     Action/Plan:   to be determined based on progress   Anticipated DC Date:  07/16/2014   Anticipated DC Plan:  HOME/SELF CARE  In-house referral  NA      DC Planning Services  CM consult      PAC Choice  NA   Choice offered to / List presented to:  NA   DME arranged  NA      DME agency  NA     [place] arranged  NA      [place] agency  NA   Status of service:  In process, will continue to follow Medicare Important Message given?   (If response is "NO", the following Medicare IM given date fields will be blank) Date Medicare IM given:   Medicare IM given by:   Date Additional Medicare IM given:   Additional Medicare IM given by:    Discharge Disposition:    Per UR Regulation:  Reviewed for med. necessity/level of care/duration of stay  If discussed at [place] of Stay Meetings, dates discussed:    Comments:  [id number]/Debroah Shuttleworth,RN,BSN,CCM: will follow for dc needs/none present at time of review.

## 2014-07-13 NOTE — Procedures (Signed)
Chest Tube Insertion Procedure Note  Indications:  Clinically significant Pneumothorax  Pre-operative Diagnosis: Pneumothorax  Post-operative Diagnosis: Pneumothorax  Procedure Details  Informed consent was obtained for the procedure, including sedation.  Risks of lung perforation, hemorrhage, arrhythmia, and adverse drug reaction were discussed.   After sterile skin prep, using standard technique, a 14 French tube was placed in the right lateral  rib space at   Nipple line .  Findings: Good air rush   Estimated Blood Loss:  Minimal         Specimens:  None              Complications:  None; patient tolerated the procedure well.         Disposition: ICU - extubated and stable.         Condition: stable  I was present for the entire procedure  Rigoberto Noel. MD

## 2014-07-13 NOTE — H&P (Signed)
Name: [name] [name] MRN: [id number] DOB: 1940-09-04    ADMISSION DATE:  07/13/2014  CHIEF COMPLAINT:  pneumothorax   BRIEF PATIENT DESCRIPTION:  73 year old male who presented to Endo on 12/8 for planned bronch/bx in effort to  evaluate chronic RLL pulmonary infiltrates. Post procedure CXR demonstrated right PTX. He was admitted to the SDU for further eval and therapy.   SIGNIFICANT EVENTS  Bronchoscopy 12/8: dr Melvyn Novas.   STUDIES:  BAL AFB 12/8>>> BAL Routine Culture (FOB) 12/8>>> BAL Fungal 12/8>>> Surgical path 12/8>>> Cytology 12/8>>>   HISTORY OF PRESENT ILLNESS:   73 yowm quit smoking around 2000 no trouble at all except for little coughing until Winter of 2015 then sob in summer 2015 still did yard work then cxr done > referred 07/06/2014 to pulmonary clinic by Dr Melina Copa For abn cxr > CT c/w bronchiectasis/ MAI can't r/o tumor. Presented to Endo on 12/8 for planned bronch/bx in effort to evaluate chronic RLL pulmonary infiltrates. Post procedure CXR demonstrated right PTX. He was admitted to the SDU for further eval and therapy but denies cp / cough or resting sob on 02   PAST MEDICAL HISTORY :   has a past medical history of Coronary atherosclerosis of native coronary artery; Essential hypertension, benign; Hyperlipidemia; SVT (supraventricular tachycardia); Sleep apnea; Anxiety; Glucose intolerance (impaired glucose tolerance); Osteoarthritis; Carotid stenosis; Long-term memory loss; GERD (gastroesophageal reflux disease); Hemorrhoids; History of colon polyps; Depression; Insomnia; COPD (chronic obstructive pulmonary disease); Skin cancer; Nosebleed; and Dementia.  has past surgical history that includes Cervical spine surgery (1978); Hemorrhoid surgery (1970); Tonsillectomy and adenoidectomy; Finger surgery; Circumcision; Back surgery; Cataract surgery; Colonoscopy; Thumb surgery; Polyp removed from nose; Knee arthroscopy (04/03/2012); Eye surgery; and Total knee arthroplasty  (Right, 10/07/2012). Prior to Admission medications   Medication Sig Start Date End Date Taking? Authorizing Provider  aspirin 81 MG tablet Take 81 mg by mouth every morning.    Yes Historical Provider, MD  atorvastatin (LIPITOR) 20 MG tablet Take 20 mg by mouth at bedtime.    Yes Historical Provider, MD  donepezil (ARICEPT) 10 MG tablet Take 30 mg by mouth at bedtime.    Yes Historical Provider, MD  escitalopram (LEXAPRO) 20 MG tablet Take 10 mg by mouth every morning.    Yes Historical Provider, MD  Lysine 500 MG CAPS Take 500 mg by mouth daily.    Yes Historical Provider, MD  megestrol (MEGACE) 40 MG tablet Take 40 mg by mouth 2 (two) times daily.   Yes Historical Provider, MD  montelukast (SINGULAIR) 10 MG tablet Take 10 mg by mouth every morning.   Yes Historical Provider, MD  oxybutynin (DITROPAN) 5 MG tablet Take 5 mg by mouth daily.   Yes Historical Provider, MD  pantoprazole (PROTONIX) 40 MG tablet Take 40 mg by mouth daily.    Yes Historical Provider, MD  QUEtiapine (SEROQUEL) 200 MG tablet Take 200 mg by mouth at bedtime.   Yes Historical Provider, MD  Tamsulosin HCl (FLOMAX) 0.4 MG CAPS Take 0.4 mg by mouth daily.    Yes Historical Provider, MD  vitamin B-12 (CYANOCOBALAMIN) 1000 MCG tablet Take 1,000 mcg by mouth daily.   Yes Historical Provider, MD  LORazepam (ATIVAN) 1 MG tablet Take 1 mg by mouth 2 (two) times daily. 1 tablet in am and 2 tablets at hs    Historical Provider, MD  niacin 500 MG CR capsule Take 500 mg by mouth at bedtime.     Historical Provider, MD  No Known Allergies  FAMILY HISTORY:  family history includes Alzheimer's disease (age of onset: 10) in his father; Arthritis in his sister; Cancer in his sister; Heart disease in his brother; Lung cancer in his sister; Other (age of onset: 56) in his mother; Parkinsonism in his mother; Stroke in his brother. SOCIAL HISTORY:  reports that he quit smoking about 14 years ago. His smoking use included Cigarettes. He has a  52.5 pack-year smoking history. He has never used smokeless tobacco. He reports that he does not drink alcohol or use illicit drugs.  REVIEW OF SYSTEMS (bolds are positive):   Constitutional: Negative for fever, chills, weight loss, malaise/fatigue and diaphoresis.  HENT: Negative for hearing loss, ear pain, nosebleeds, congestion, sore throat, neck pain, tinnitus and ear discharge.   Eyes: Negative for blurred vision, double vision, photophobia, pain, discharge and redness.  Respiratory: Negative for cough, hemoptysis, no sputum production, shortness of breath, wheezing and stridor.   Cardiovascular: Negative for chest pain, palpitations, orthopnea, claudication, leg swelling and PND.  Gastrointestinal: Negative for heartburn, nausea, vomiting, abdominal pain, diarrhea, constipation, blood in stool and melena.  Genitourinary: Negative for dysuria, urgency, frequency, hematuria and flank pain.  Musculoskeletal: Negative for myalgias, back pain, joint pain and falls.  Skin: Negative for itching and rash.  Neurological: Negative for dizziness, tingling, tremors, sensory change, speech change, focal weakness, seizures, loss of consciousness, weakness and headaches.  Endo/Heme/Allergies: Negative for environmental allergies and polydipsia. Does not bruise/bleed easily.  SUBJECTIVE:  Denies pain /sob at 45 degrees on 2lpm NP   VITAL SIGNS: Temp:  [98.5 F (36.9 C)] 98.5 F (36.9 C) (12/08 0903) Pulse Rate:  [54-110] 55 (12/08 1005) Resp:  [11-22] 21 (12/08 1005) BP: (116-149)/(58-88) 139/84 mmHg (12/08 1000) SpO2:  [87 %-100 %] 91 % (12/08 1005)  PHYSICAL EXAMINATION: General:  Pleasant awake alert 73 year old male, not in acute distress.  Neuro:  Awake, oriented to situation and time.  HEENT:  [place], no JVD  Cardiovascular:  rrr Lungs:  Exp wheeze, decreased on right  Abdomen:  Soft, non-tender + bowel sounds  Musculoskeletal:  Intact  Skin:  Intact   No results for input(s): NA, K,  CL, CO2, BUN, CREATININE, GLUCOSE in the last 168 hours. No results for input(s): HGB, HCT, WBC, PLT in the last 168 hours. Dg Chest Port 1 View  07/13/2014   ADDENDUM REPORT: 07/13/2014 10:14  ADDENDUM: These results were called by telephone at the time of interpretation on 07/13/2014 at 10:14 am to Dr. Christinia Gully , who verbally acknowledged these results.   Electronically Signed   By: Rosemarie Ax   On: 07/13/2014 10:14   07/13/2014   CLINICAL DATA:  73 year old status post right lower lobe bronchoscopy and biopsy.  EXAM: PORTABLE CHEST - 1 VIEW  COMPARISON:  None.  FINDINGS: A small to moderate right pneumothorax is present.  Patchy right lung base consolidation is present. There is mild interstitial prominence bilaterally. Upper lobe predominant emphysematous changes are present. There is no pleural effusion.  The cardiac silhouette and mediastinal contours are within normal limits. There is mild leftward mediastinal shift versus rotation related to patient positioning.  A remote fracture of the mid right clavicle is present. Degenerative changes of the shoulders and spine are noted.  IMPRESSION: 1. Small to moderate right pneumothorax, question small component of tension given mild leftward mediastinal shift.  2. Patchy areas of consolidation and interstitial thickening likely correspond to pneumonitis.  Electronically Signed: By: Rosemarie Ax  On: 07/13/2014 09:18    ASSESSMENT / PLAN:  Right pneumothorax s/p bronch transbronchial biopsy RLL: no current distress but would estimate 20-25% of right hemithorax Plan Admit to ICU/SDU Cont pulse ox and tele monitoring  Plan on placing Morrill catheter for PTX, will place 20 cmH20, f/u serial CXRs and PRN analgesia   Chronic RLL pulmonary infiltrates. Now s/p bronch bx. D dx: recurrent aspiration given cognitive decline vs possible cancer.  Plan F/u bx results  H/o dementia  Plan Cont home meds     Christinia Gully, MD Pulmonary and  [place] [phone] After 5:30 PM or weekends, call [phone]

## 2014-07-13 NOTE — Progress Notes (Signed)
Pts chest xray reviewed results. Noted to have a small to moderate right pneumothorax. Paged Dr. Melvyn Novas to notify of results. VSS. Sats 97% on RA. No distress noted. SmondayRn

## 2014-07-13 NOTE — Progress Notes (Signed)
Video bronchoscopy performed Intervention bronchial washings Intervention bronchial biopsy Pt tolerated well  Traniya Prichett Caston RRT  

## 2014-07-13 NOTE — Op Note (Signed)
Bronchoscopy Procedure Note  Date of Operation: 07/13/2014   Pre-op Diagnosis: ? Asp vs BAC  Post-op Diagnosis: same  Surgeon: Christinia Gully  Anesthesia: Monitored Local Anesthesia with Sedation  Operation: Video Flexible fiberoptic bronchoscopy, diagnostic   Findings: nl airways  Specimen: BAL RLL/ TBBx RLL  Estimated Blood Loss: min   Complications: none   Indications and History: See updated H and P same date. The risks, benefits, complications, treatment options and expected outcomes were discussed with the patient.  The possibilities of reaction to medication, pulmonary aspiration, perforation of a viscus, bleeding, failure to diagnose a condition and creating a complication requiring transfusion or operation were discussed with the patient who freely signed the consent.    Description of Procedure: The patient was re-examined in the bronchoscopy suite and the site of surgery properly noted/marked.  The patient was identified  and the procedure verified as Flexible Fiberoptic Bronchoscopy.  A Time Out was held and the above information confirmed.   After the induction of topical nasopharyngeal anesthesia, the patient was positioned  and the bronchoscope was passed through the L naris. The vocal cords were visualized and  1% buffered lidocaine 5 ml was topically placed onto the cords. The cords were nl. The scope was then passed into the trachea.  1% buffered lidocaine given topically. Airways inspected bilaterally to the subsegmental level with the following findings:  1) airways were nl  Interventions 1) BAL RLL 2) Tbbx RLL post basal segment      The Patient was taken to the Endoscopy Recovery area in satisfactory condition.  Attestation: I performed the procedure.  Christinia Gully, MD Pulmonary and [place] [phone] After 5:30 PM or weekends, call [phone]

## 2014-07-13 NOTE — Progress Notes (Signed)
[name] Babcock,NP at patients bedside . He is being admitted. Pt was placed on 100% nonrebreather, O2 Sats 100%. No distress noted. VSS. Report being called to ICU stepdown. Wife updated. SmondaYRn

## 2014-07-14 ENCOUNTER — Inpatient Hospital Stay (HOSPITAL_COMMUNITY): Payer: Medicare Other

## 2014-07-14 ENCOUNTER — Encounter (HOSPITAL_COMMUNITY): Payer: Self-pay | Admitting: Internal Medicine

## 2014-07-14 DIAGNOSIS — R918 Other nonspecific abnormal finding of lung field: Secondary | ICD-10-CM

## 2014-07-14 MED ORDER — ACETAMINOPHEN 325 MG PO TABS
650.0000 mg | ORAL_TABLET | Freq: Four times a day (QID) | ORAL | Status: DC | PRN
  Administered 2014-07-14 – 2014-07-15 (×2): 650 mg via ORAL
  Filled 2014-07-14 (×2): qty 2

## 2014-07-14 NOTE — Progress Notes (Signed)
Nutrition Brief Note  Patient identified on the Malnutrition Screening Tool (MST) Report  Wt Readings from Last 15 Encounters:  07/14/14 158 lb 15.2 oz (72.1 kg)  07/06/14 162 lb (73.483 kg)  03/16/13 164 lb 12 oz (74.73 kg)  10/07/12 168 lb (76.204 kg)  09/30/12 168 lb (76.204 kg)  04/01/12 163 lb 2 oz (73.993 kg)  11/05/11 167 lb (75.751 kg)  12/04/10 165 lb (74.844 kg)  11/08/09 170 lb (77.111 kg)  11/10/08 173 lb (78.472 kg)    Body mass index is 24.17 kg/(m^2). Patient meets criteria for Normal weight based on current BMI.   Current diet order is Regular, patient is consuming approximately 75% of meals at this time. Labs and medications reviewed.   Pt denied any changes in weight or appetite pta. Noted that usual body weight is around 150 lbs, and eats well at home. Denied any nausea, vomiting or abd pain. No nutritional concerns at this time.  No nutrition interventions warranted at this time. If nutrition issues arise, please consult RD.   Atlee Abide MS RD LDN Clinical Dietitian [phone]

## 2014-07-14 NOTE — Procedures (Signed)
Sutures removed CT removed w/out difficulty Occlusive dressing applied  Erick Colace ACNP-BC [place] Pager # [phone] OR # [phone] if no answer

## 2014-07-14 NOTE — Plan of Care (Signed)
Problem: Phase II Progression Outcomes Goal: Progress activity as tolerated unless otherwise ordered Outcome: Progressing     

## 2014-07-14 NOTE — Progress Notes (Signed)
Name: KALEB SEK MRN: [id number] DOB: August 21, 1940    ADMISSION DATE:  07/13/2014  CHIEF COMPLAINT:  pneumothorax   BRIEF PATIENT DESCRIPTION:  73 year old male who presented to Endo on 12/8 for planned bronch/bx in effort to  evaluate chronic RLL pulmonary infiltrates. Post procedure CXR demonstrated right PTX. He was admitted to the SDU for further eval and therapy.  referred 07/06/2014 to pulmonary clinic by Dr Melina Copa For abn cxr > CT c/w bronchiectasis/ MAI can't r/o tumor.  SIGNIFICANT EVENTS  Bronchoscopy 12/8: dr Melvyn Novas.   STUDIES:  BAL AFB 12/8>>> BAL Routine Culture (FOB) 12/8>>> BAL Fungal 12/8>>> Surgical path 12/8>>> Cytology 12/8>>>  SUBJECTIVE:  Denies pain /sob   VITAL SIGNS: Temp:  [97.1 F (36.2 C)-98.6 F (37 C)] 98.5 F (36.9 C) (12/09 0750) Pulse Rate:  [49-111] 57 (12/09 0700) Resp:  [8-21] 14 (12/09 0700) BP: (125-171)/(63-104) 128/76 mmHg (12/09 0700) SpO2:  [91 %-100 %] 96 % (12/09 0700) FiO2 (%):  [100 %] 100 % (12/08 1145) Weight:  [71.6 kg (157 lb 13.6 oz)-72.1 kg (158 lb 15.2 oz)] 72.1 kg (158 lb 15.2 oz) (12/09 0500)  PHYSICAL EXAMINATION: General:  Pleasant awake alert 73 year old male, not in acute distress.  Neuro:  Awake, oriented to situation and time.  HEENT:  [place], no JVD  Cardiovascular:  rrr Lungs:coarse BS, decreased on right , no air leak on chest tube Abdomen:  Soft, non-tender + bowel sounds  Musculoskeletal:  Intact  Skin:  Intact    Recent Labs Lab 07/13/14 1300  NA 144  K 4.0  CL 109  CO2 22  BUN 18  CREATININE 1.04  GLUCOSE 97    Recent Labs Lab 07/13/14 1300  HGB 11.7*  HCT 36.9*  WBC 7.3  PLT 151   Dg Chest Port 1 View  07/13/2014   CLINICAL DATA:  73 year old male status post chest tube placement for right pneumothorax. Initial encounter.  EXAM: PORTABLE CHEST - 1 VIEW  COMPARISON:  0843 hr the same day.  FINDINGS: Portable AP semi upright view at 1234 hrs. Percutaneous right chest tube placed,  coursing to the right lung apex. Right pneumothorax no longer evident. Asymmetric opacification of the right apex may relate to pleural effusion. Otherwise Stable ventilation. Stable cardiac size and mediastinal contours. Stable visualized osseous structures.  IMPRESSION: 1. Right chest tube placed, right pneumothorax resolved. 2. Suspect pleural effusion in the right lung apex.   Electronically Signed   By: Lars Pinks M.D.   On: 07/13/2014 13:06   Dg Chest Port 1 View  07/13/2014   ADDENDUM REPORT: 07/13/2014 10:14  ADDENDUM: These results were called by telephone at the time of interpretation on 07/13/2014 at 10:14 am to Dr. Christinia Gully , who verbally acknowledged these results.   Electronically Signed   By: Rosemarie Ax   On: 07/13/2014 10:14   07/13/2014   CLINICAL DATA:  73 year old status post right lower lobe bronchoscopy and biopsy.  EXAM: PORTABLE CHEST - 1 VIEW  COMPARISON:  None.  FINDINGS: A small to moderate right pneumothorax is present.  Patchy right lung base consolidation is present. There is mild interstitial prominence bilaterally. Upper lobe predominant emphysematous changes are present. There is no pleural effusion.  The cardiac silhouette and mediastinal contours are within normal limits. There is mild leftward mediastinal shift versus rotation related to patient positioning.  A remote fracture of the mid right clavicle is present. Degenerative changes of the shoulders and spine are noted.  IMPRESSION: 1. Small to moderate right pneumothorax, question small component of tension given mild leftward mediastinal shift.  2. Patchy areas of consolidation and interstitial thickening likely correspond to pneumonitis.  Electronically Signed: By: Rosemarie Ax On: 07/13/2014 09:18   Dg C-arm Bronchoscopy  07/13/2014   CLINICAL DATA:    C-ARM BRONCHOSCOPY  Fluoroscopy was utilized by the requesting physician.  No radiographic  interpretation.     ASSESSMENT / PLAN:  Right pneumothorax s/p  bronch transbronchial biopsy RLL: no current distress but would estimate 20-25% of right hemithorax Plan Chest tube to water seal - rpt CXR in 4h if no leak or recurrence, then dc chest tube   Chronic RLL pulmonary infiltrates. Now s/p bronch bx. D dx: recurrent aspiration given cognitive decline vs possible cancer.  Plan F/u bx results  H/o dementia  Plan Cont home meds   Hope to discharge in 24h  Rigoberto Noel. MD

## 2014-07-15 ENCOUNTER — Inpatient Hospital Stay (HOSPITAL_COMMUNITY): Payer: Medicare Other

## 2014-07-15 LAB — CULTURE, RESPIRATORY: SPECIAL REQUESTS: NORMAL

## 2014-07-15 LAB — CULTURE, RESPIRATORY W GRAM STAIN

## 2014-07-15 NOTE — Progress Notes (Signed)
07/15/14 1430  Reviewed discharge instructions with patient and his wife.  Both verbalized understanding of discharge instructions. Copy of discharge instructions were given to patient's wife.

## 2014-07-15 NOTE — Progress Notes (Signed)
Name: [name] [name] MRN: [id number] DOB: 1941-03-20    ADMISSION DATE:  07/13/2014  CHIEF COMPLAINT:  pneumothorax   BRIEF PATIENT DESCRIPTION:  73 year old male who presented to Endo on 12/8 for planned bronch/bx in effort to  evaluate chronic RLL pulmonary infiltrates. Post procedure CXR demonstrated right PTX. He was admitted to the SDU for further eval and therapy.  referred 07/06/2014 to pulmonary clinic by Dr Melina Copa For abn cxr > CT c/w bronchiectasis/ MAI can't r/o tumor.  SIGNIFICANT EVENTS  Bronchoscopy 12/8: dr Melvyn Novas.  12/8: right CT (wayne cath) 12/9 CT removed 12/10: MBS>>>  STUDIES:  BAL AFB 12/8>>> BAL Routine Culture (FOB) 12/8>>>neg BAL Fungal 12/8>>> Surgical path 12/8>>>neg Cytology 12/8>>>neg  SUBJECTIVE:  Denies pain /sob  VITAL SIGNS: Temp:  [97.7 F (36.5 C)-99.3 F (37.4 C)] 98.1 F (36.7 C) (12/10 0800) Pulse Rate:  [52-88] 52 (12/10 0700) Resp:  [14-20] 20 (12/09 2100) BP: (122-155)/(57-103) 136/69 mmHg (12/10 0700) SpO2:  [95 %-99 %] 96 % (12/10 0700) Weight:  [71.6 kg (157 lb 13.6 oz)] 71.6 kg (157 lb 13.6 oz) (12/10 0400) Room air  PHYSICAL EXAMINATION: General:  Pleasant awake alert 73 year old male, not in acute distress.  Neuro:  Awake, oriented to situation and time.  HEENT:  [place], no JVD  Cardiovascular:  rrr Lungs:coarse BS s/p cough. Dressing clear.  Abdomen:  Soft, non-tender + bowel sounds  Musculoskeletal:  Intact  Skin:  Intact    Recent Labs Lab 07/13/14 1300  NA 144  K 4.0  CL 109  CO2 22  BUN 18  CREATININE 1.04  GLUCOSE 97    Recent Labs Lab 07/13/14 1300  HGB 11.7*  HCT 36.9*  WBC 7.3  PLT 151   Dg Chest 2 View  07/15/2014   CLINICAL DATA:  History of right pneumothorax  EXAM: CHEST  2 VIEW  COMPARISON:  07/14/2014  FINDINGS: Cardiac shadow is within normal limits. The lungs are well aerated with mild interstitial changes. No recurrent pneumothorax is noted. No focal infiltrate or sizable effusion is  seen. An old right clavicular fracture is noted.  IMPRESSION: No evidence of recurrent pneumothorax. No acute abnormality is seen.   Electronically Signed   By: Inez Catalina M.D.   On: 07/15/2014 09:10   Dg Chest Port 1 View  07/14/2014   CLINICAL DATA:  Right chest tube removal, placed yesterday at the time of right lung biopsy.  EXAM: PORTABLE CHEST - 1 VIEW 1445 hr:  COMPARISON:  Portable chest x-ray earlier same date 0830 hr and yesterday.  FINDINGS: No evidence of pneumothorax after right chest tube removal. Stable biapical pleural-parenchymal scarring. Stable patchy opacities in the lower lobes, right greater than left, and in the lingula. No new pulmonary parenchymal abnormalities.  IMPRESSION: 1. No pneumothorax after right chest tube removal. 2. Stable patchy opacities in the lower lobes, right greater than left, and in the lingula. No new abnormalities.   Electronically Signed   By: Evangeline Dakin M.D.   On: 07/14/2014 15:56   Dg Chest Port 1 View  07/14/2014   CLINICAL DATA:  Follow-up right pneumothorax with chest tube in place.  EXAM: PORTABLE CHEST - 1 VIEW  COMPARISON:  Portable chest x-rays yesterday. Two-view chest x-ray 06/22/2014, 04/01/2012. CT chest 06/24/2014.  FINDINGS: Pigtail right chest tube in place with no evidence of pneumothorax. Biapical pleuroparenchymal opacity, unchanged dating back to 2013, related to scarring. Patchy opacity at the right lung base, shown on the  prior CT to be within the right lower lobe, not significantly changed. Lesser patchy opacities in the left lower lobe and lingula, also unchanged. No new pulmonary parenchymal abnormalities. Cardiac silhouette upper normal in size, unchanged. Thoracic aorta tortuous and atherosclerotic, unchanged. Hilar and mediastinal contours otherwise unremarkable.  IMPRESSION: 1. No pneumothorax with right chest tube in place. 2. Stable patchy opacities in the lower lobes, right greater than left, and in the lingula. No new  abnormalities. 3. Stable biapical pleural-parenchymal scarring.   Electronically Signed   By: Evangeline Dakin M.D.   On: 07/14/2014 09:14   Dg Chest Port 1 View  07/13/2014   CLINICAL DATA:  73 year old male status post chest tube placement for right pneumothorax. Initial encounter.  EXAM: PORTABLE CHEST - 1 VIEW  COMPARISON:  0843 hr the same day.  FINDINGS: Portable AP semi upright view at 1234 hrs. Percutaneous right chest tube placed, coursing to the right lung apex. Right pneumothorax no longer evident. Asymmetric opacification of the right apex may relate to pleural effusion. Otherwise Stable ventilation. Stable cardiac size and mediastinal contours. Stable visualized osseous structures.  IMPRESSION: 1. Right chest tube placed, right pneumothorax resolved. 2. Suspect pleural effusion in the right lung apex.   Electronically Signed   By: Lars Pinks M.D.   On: 07/13/2014 13:06    ASSESSMENT / PLAN:  Right pneumothorax s/p bronch transbronchial biopsy RLL: no current distress but would estimate 20-25% of right hemithorax-->resolved Plan Avoid lifting > 5 lbs next 2 weeks F/u our office 5d   Chronic RLL pulmonary infiltrates. Now s/p bronch bx. D dx: recurrent aspiration given cognitive decline vs possible cancer.  All cytology, path and culture data neg Plan MBS today, then home this afternoon.   H/o dementia  Plan Cont home meds   D/c this afternoon.   Erick Colace ACNP-BC [place] Pager # [phone] OR # [phone] if no answer

## 2014-07-15 NOTE — Discharge Instructions (Signed)
Flexible Bronchoscopy, Care After These instructions give you information on caring for yourself after your procedure. Your doctor may also give you more specific instructions. Call your doctor if you have any problems or questions after your procedure. HOME CARE  Do not eat or drink anything for 2 hours after your procedure. If you try to eat or drink before the medicine wears off, food or drink could go into your lungs. You could also burn yourself.  After 2 hours have passed and when you can cough and gag normally, you may eat soft food and drink liquids slowly.  The day after the test, you may eat your normal diet.  You may do your normal activities.  Keep all doctor visits. GET HELP RIGHT AWAY IF:  You get more and more short of breath.  You get light-headed.  You feel like you are going to pass out (faint).  You have chest pain.  You have new problems that worry you.  You cough up more than a little blood.  You cough up more blood than before. MAKE SURE YOU:  Understand these instructions.  Will watch your condition.  Will get help right away if you are not doing well or get worse. Document Released: 05/20/2009 Document Revised: 07/28/2013 Document Reviewed: 03/27/2013 [facility] Patient Information 2015 [place], [place]. This information is not intended to replace advice given to you by your health care provider. Make sure you discuss any questions you have with your health care provider.  Do not eat or drink until after 1020 today 07/13/2014   Conscious Sedation, Adult, Care After Refer to this sheet in the next few weeks. These instructions provide you with information on caring for yourself after your procedure. Your health care provider may also give you more specific instructions. Your treatment has been planned according to current medical practices, but problems sometimes occur. Call your health care provider if you have any problems or questions after your  procedure. WHAT TO EXPECT AFTER THE PROCEDURE  After your procedure:  You may feel sleepy, clumsy, and have poor balance for several hours.  Vomiting may occur if you eat too soon after the procedure. HOME CARE INSTRUCTIONS  Do not participate in any activities where you could become injured for at least 24 hours. Do not:  Drive.  Swim.  Ride a bicycle.  Operate heavy machinery.  Cook.  Use power tools.  Climb ladders.  Work from a high place.  Do not make important decisions or sign legal documents until you are improved.  If you vomit, drink water, juice, or soup when you can drink without vomiting. Make sure you have little or no nausea before eating solid foods.  Only take over-the-counter or prescription medicines for pain, discomfort, or fever as directed by your health care provider.  Make sure you and your family fully understand everything about the medicines given to you, including what side effects may occur.  You should not drink alcohol, take sleeping pills, or take medicines that cause drowsiness for at least 24 hours.  If you smoke, do not smoke without supervision.  If you are feeling better, you may resume normal activities 24 hours after you were sedated.  Keep all appointments with your health care provider. SEEK MEDICAL CARE IF:  Your skin is pale or bluish in color.  You continue to feel nauseous or vomit.  Your pain is getting worse and is not helped by medicine.  You have bleeding or swelling.  You are still  sleepy or feeling clumsy after 24 hours. SEEK IMMEDIATE MEDICAL CARE IF:  You develop a rash.  You have difficulty breathing.  You develop any type of allergic problem.  You have a fever. MAKE SURE YOU:  Understand these instructions.  Will watch your condition.  Will get help right away if you are not doing well or get worse. Document Released: 05/13/2013 Document Reviewed: 05/13/2013 [facility] Patient Information 2015  [place], [place]. This information is not intended to replace advice given to you by your health care provider. Make sure you discuss any questions you have with your health care provider.   No lifting More than 5 lbs May shower Place regular bandage over chest tube site

## 2014-07-15 NOTE — Procedures (Addendum)
Objective Swallowing Evaluation: Modified Barium Swallowing Study  Patient Details  Name: CLIFTON SAFLEY MRN: [id number] Date of Birth: 26-Jan-1941  Today's Date: 07/15/2014 Time: 1125-1145 SLP Time Calculation (min) (ACUTE ONLY): 20 min  Past Medical History:  Past Medical History  Diagnosis Date  . Coronary atherosclerosis of native coronary artery     DES to LAD and circumflex 2006  . Essential hypertension, benign   . Hyperlipidemia   . SVT (supraventricular tachycardia)     Status post RFA  . Sleep apnea   . Anxiety   . Glucose intolerance (impaired glucose tolerance)   . Osteoarthritis   . Carotid stenosis   . Long-term memory loss     Aricept and Seroquel  . GERD (gastroesophageal reflux disease)   . Hemorrhoids   . History of colon polyps   . Depression   . Insomnia   . COPD (chronic obstructive pulmonary disease)   . Skin cancer   . Nosebleed     09/15/12 - Dr Adriana Reams, ENT   . Dementia     Dr Bjorn Loser - Neurology   Past Surgical History:  Past Surgical History  Procedure Laterality Date  . Cervical spine surgery  1978    x 2  . Hemorrhoid surgery  1970  . Tonsillectomy and adenoidectomy    . 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 Circumcision      Age 53  . Back surgery    . Cataract surgery      Bilateral  . Colonoscopy    . Thumb surgery      Bilateral  . Polyp removed from nose    . Knee arthroscopy  04/03/2012    Procedure: ARTHROSCOPY KNEE;  Surgeon: Marin Shutter, MD;  Location: [place];  Service: Orthopedics;  Laterality: Right;  Right Knee Arthroscopy with Debridement   . Eye surgery    . Total knee arthroplasty Right 10/07/2012    Procedure: TOTAL KNEE ARTHROPLASTY;  Surgeon: Mauri Pole, MD;  Location: [facility];  Service: Orthopedics;  Laterality: Right;  . Video bronchoscopy Bilateral 07/13/2014    Procedure: VIDEO BRONCHOSCOPY WITH FLUORO;  Surgeon: Tanda Rockers, MD;  Location: [facility];  Service: Cardiopulmonary;   Laterality: Bilateral;   HPI:  73 yo male adm to [facility] for planned bronchoscopy to evaluate right lower lobe infiltrates - s/p procedure with wayne cath removed 12/9- pnemothorax.   MBS ordered to evaluate swallow ability.  Pt with PMH + for Coronary atherosclerosis of native coronary artery; Essential hypertension, benign; Hyperlipidemia; SVT (supraventricular tachycardia); Sleep apnea; Anxiety; Glucose intolerance (impaired glucose tolerance); Osteoarthritis; Carotid stenosis; Long-term memory loss; GERD (gastroesophageal reflux disease); Hemorrhoids; History of colon polyps; Depression; Insomnia; COPD (chronic obstructive pulmonary disease); Skin cancer; Nosebleed; and Dementia.  PSH + for cervical spine surgery 1978.  Pt denies dysphagia, consumes regular/thin diet.       Assessment / Plan / Recommendation Clinical Impression  Dysphagia Diagnosis: Within Functional Limits  Clinical impression: Pt presents with functional oropharyngeal swallow without aspiration or frank penetratin of any consistency tested *tablet with thin, cracker, puree, thin.  Minimal amount of laryngeal penetration intermittently noted with thin that cleared with further swallows due to adequate laryngeal elevation.  Penetration was worse with mixed consistencies and therefore advised pt to avoid mixed consistencies if problematic.  Overall swallow was timely and strong with only minimal pharyngeal residuals.    Given pt's COPD, advised him to use caution with  eating/drinking if becomes dyspneic and to general aspiration precautions.  Using live monitor, provided pt with results of tests and compensation strategies.  All education completed, SLP will sign off.  Thanks for this consult.    Treatment Recommendation  No treatment recommended at this time    Diet Recommendation Regular;Thin liquid   Medication Administration:  (start and follow with water and pill with pudding/puree if problematic in future ) Supervision:  Patient able to self feed Compensations: Slow rate;Small sips/bites (consume liquids t/o meal) Postural Changes and/or Swallow Maneuvers: Seated upright 90 degrees;Upright 30-60 min after meal    Other  Recommendations Oral Care Recommendations: Oral care BID   Follow Up Recommendations  None      General Date of Onset: 07/15/14 HPI: 73 yo male adm to [facility] for planned bronchoscopy to evaluate right lower lobe infiltrates - s/p procedure with wayne cath removed 12/9- pnemothorax.   MBS ordered to evaluate swallow ability.  Pt with PMH + for Coronary atherosclerosis of native coronary artery; Essential hypertension, benign; Hyperlipidemia; SVT (supraventricular tachycardia); Sleep apnea; Anxiety; Glucose intolerance (impaired glucose tolerance); Osteoarthritis; Carotid stenosis; Long-term memory loss; GERD (gastroesophageal reflux disease); Hemorrhoids; History of colon polyps; Depression; Insomnia; COPD (chronic obstructive pulmonary disease); Skin cancer; Nosebleed; and Dementia.  PSH + for cervical spine surgery 1978.  Pt denies dysphagia, consumes regular/thin diet.   Type of Study: Modified Barium Swallowing Study Reason for Referral: Objectively evaluate swallowing function Diet Prior to this Study: Regular;Thin liquids Temperature Spikes Noted: No Respiratory Status: Room air History of Recent Intubation: No Behavior/Cognition: Alert;Cooperative;Pleasant mood Oral Cavity - Dentition: Dentures, not available (pt reports dentures in room) Oral Motor / Sensory Function: Within functional limits Self-Feeding Abilities: Able to feed self Patient Positioning: Upright in chair Baseline Vocal Quality: Clear Volitional Cough: Other (Comment) (did not elicit secondary to medical condition and procedure) Volitional Swallow: Able to elicit Anatomy: Within functional limits (appearance of osteophyte type structure at C6-C7) Pharyngeal Secretions: Not observed secondary MBS    Reason for Referral  Objectively evaluate swallowing function   Oral Phase Oral Preparation/Oral Phase Oral Phase: WFL (pt consumes large boluses) Oral - Thin Oral - Thin Cup: Within functional limits;Piecemeal swallowing Oral - Thin Straw: Within functional limits;Piecemeal swallowing Oral - Solids Oral - Puree: Within functional limits Oral - Regular: Within functional limits;Piecemeal swallowing Oral - Pill: Within functional limits   Pharyngeal Phase Pharyngeal Phase Pharyngeal Phase: Impaired Pharyngeal - Thin Pharyngeal - Thin Cup: Penetration/Aspiration during swallow Penetration/Aspiration details (thin cup): Material enters airway, remains ABOVE vocal cords then ejected out Pharyngeal - Thin Straw: Pharyngeal residue - valleculae;Pharyngeal residue - pyriform sinuses;Penetration/Aspiration during swallow Penetration/Aspiration details (thin straw): Material enters airway, remains ABOVE vocal cords then ejected out Pharyngeal - Solids Pharyngeal - Puree: Pharyngeal residue - valleculae;Pharyngeal residue - pyriform sinuses Pharyngeal - Regular: Within functional limits Pharyngeal - Pill: Penetration/Aspiration during swallow Penetration/Aspiration details (pill): Material enters airway, remains ABOVE vocal cords then ejected out (deeper penetration of thin liquid when taking pill) Pharyngeal Phase - Comment Pharyngeal Comment: consuming liquids faciliated clearance of minimal residuals of puree  Cervical Esophageal Phase    GO    Cervical Esophageal Phase Cervical Esophageal Phase: WFL (pt noted to belch but denies any symptoms of reflux)         Luanna Salk, MS [facility] SLP [phone]  SLP spoke to [place] with [facility] and he reports spouse states pt chokes nearly every meal requiring him to get up and leave the table.  As  spouse was not present during [place], SLP visited pt/spouse to provide further education re: swallowing/compensation strategies for maximal airway protection.  Reviewed results of  MBS and provided education in writing.

## 2014-07-15 NOTE — Discharge Summary (Signed)
Physician Discharge Summary       Patient ID: [name] [name] MRN: [id number] DOB/AGE: [birth year] 73 y.o.  Admit date: 07/13/2014 Discharge date: 07/15/2014  Discharge Diagnoses:  Right pneumothorax s/p bronchoscopy  Chronic right lower lobe pulmonary infiltrate Mild dysphagia   Detailed Hospital Course:   73 yowm quit smoking around 2000 no trouble at all except for little coughing until Winter of 2015 then sob in summer 2015 still did yard work then cxr done > referred 07/06/2014 to pulmonary clinic by Dr Melina Copa For abn cxr > CT c/w bronchiectasis/ MAI can't r/o tumor. Presented to Endo on 12/8 for planned bronch/bx in effort to evaluate chronic RLL pulmonary infiltrates. Post procedure CXR demonstrated right PTX. He was admitted to the SDU for further eval and therapy but denied cp / cough or resting sob on 02. Right chest tube was placed. Air leak was immediate for about 30 seconds and then resolved with suction placed on 20 cmH2O. F/U CXR was negative for pneumothorax s/p CT placement. He was kept on suction overnight and then suction was removed and placed on water seal on 12/9. F/U CXR was negative for PTX so CT was removed that afternoon. He was monitored over night. F/U CXR (PA/LAT) on 12/10 showed: resolution of pneumothorax.  F/U Path and Cytology was back and demonstrated benign lung tissue w/ no evidence of malignancy. Cultures were negative. Because of these findings we went ahead and ordered an MBS to assess swallowing and risk of aspiration. It showed: functional oropharyngeal swallow without aspiration or frank penetratin of any consistency tested (tablet with thin, cracker, puree, thin). He did have Minimal amount of laryngeal penetration intermittently noted with thin that cleared with further swallows due to adequate laryngeal elevation. Penetration was worse with mixed consistencies and therefore advised pt to avoid mixed consistencies if problematic. Overall swallow was  timely and strong with only minimal pharyngeal residuals. SLP did notice that he had tendency to take large boluses of food which could increase risk further. These findings in conjunction with the observations of his wife, who has noted he frequently will get up during meals and will go to the restroom and cough lead [place] to believe the most likely explanation for his chronic RLL infiltrate is due to aspiration  He was deemed ready for d/c as of 12/10 w/ the following plan of care.   Discharge Plan by active problems  Chronic RLL pulmonary infiltrates. Now s/p bronch bx which was negative for malignancy.  Mild Dysphagia (MBS 12/10) Suspect chronic aspiration Plan F/u with Wert.  Regular;Thin liquid  Medication Administration: (start and follow with water and pill with pudding/puree if problematic in future) Supervision: Patient able to self feed Compensations: Slow rate;Small sips/bites (consume liquids t/o meal) Postural Changes and/or Swallow Maneuvers: Seated upright 90 degrees;Upright 30-60 min after meal  Significant Hospital tests/ studies  Consults: none  Bronchoscopy 12/8: dr Melvyn Novas.  12/8: right CT (wayne cath) 12/9 CT removed 12/10: MBS: see above   Discharge Exam: BP 121/88 mmHg  Pulse 74  Temp(Src) 98.1 F (36.7 C) (Oral)  Resp 13  Ht 5\' 8"  (1.727 m)  Wt 71.6 kg (157 lb 13.6 oz)  BMI 24.01 kg/m2  SpO2 98%  General: Pleasant awake alert 73 year old male, not in acute distress.  Neuro: Awake, oriented to situation and time.  HEENT: [place], no JVD  Cardiovascular: rrr Lungs:coarse BS s/p cough. Dressing clear.  Abdomen: Soft, non-tender + bowel sounds  Musculoskeletal: Intact  Skin: Intact  Labs at discharge Lab Results  Component Value Date   CREATININE 1.04 07/13/2014   BUN 18 07/13/2014   NA 144 07/13/2014   K 4.0 07/13/2014   CL 109 07/13/2014   CO2 22 07/13/2014   Lab Results  Component Value Date   WBC 7.3 07/13/2014   HGB 11.7* 07/13/2014    HCT 36.9* 07/13/2014   MCV 82.2 07/13/2014   PLT 151 07/13/2014   Lab Results  Component Value Date   ALT 11 07/13/2014   AST 13 07/13/2014   ALKPHOS 66 07/13/2014   BILITOT 0.3 07/13/2014   Lab Results  Component Value Date   INR 1.01 09/30/2012   INR 0.93 12/25/2010    Current radiology studies Dg Chest 2 View  07/15/2014   CLINICAL DATA:  History of right pneumothorax  EXAM: CHEST  2 VIEW  COMPARISON:  07/14/2014  FINDINGS: Cardiac shadow is within normal limits. The lungs are well aerated with mild interstitial changes. No recurrent pneumothorax is noted. No focal infiltrate or sizable effusion is seen. An old right clavicular fracture is noted.  IMPRESSION: No evidence of recurrent pneumothorax. No acute abnormality is seen.   Electronically Signed   By: Inez Catalina M.D.   On: 07/15/2014 09:10   Dg Chest Port 1 View  07/14/2014   CLINICAL DATA:  Right chest tube removal, placed yesterday at the time of right lung biopsy.  EXAM: PORTABLE CHEST - 1 VIEW 1445 hr:  COMPARISON:  Portable chest x-ray earlier same date 0830 hr and yesterday.  FINDINGS: No evidence of pneumothorax after right chest tube removal. Stable biapical pleural-parenchymal scarring. Stable patchy opacities in the lower lobes, right greater than left, and in the lingula. No new pulmonary parenchymal abnormalities.  IMPRESSION: 1. No pneumothorax after right chest tube removal. 2. Stable patchy opacities in the lower lobes, right greater than left, and in the lingula. No new abnormalities.   Electronically Signed   By: Evangeline Dakin M.D.   On: 07/14/2014 15:56   Dg Chest Port 1 View  07/14/2014   CLINICAL DATA:  Follow-up right pneumothorax with chest tube in place.  EXAM: PORTABLE CHEST - 1 VIEW  COMPARISON:  Portable chest x-rays yesterday. Two-view chest x-ray 06/22/2014, 04/01/2012. CT chest 06/24/2014.  FINDINGS: Pigtail right chest tube in place with no evidence of pneumothorax. Biapical pleuroparenchymal  opacity, unchanged dating back to 2013, related to scarring. Patchy opacity at the right lung base, shown on the prior CT to be within the right lower lobe, not significantly changed. Lesser patchy opacities in the left lower lobe and lingula, also unchanged. No new pulmonary parenchymal abnormalities. Cardiac silhouette upper normal in size, unchanged. Thoracic aorta tortuous and atherosclerotic, unchanged. Hilar and mediastinal contours otherwise unremarkable.  IMPRESSION: 1. No pneumothorax with right chest tube in place. 2. Stable patchy opacities in the lower lobes, right greater than left, and in the lingula. No new abnormalities. 3. Stable biapical pleural-parenchymal scarring.   Electronically Signed   By: Evangeline Dakin M.D.   On: 07/14/2014 09:14   Dg Swallowing Func-speech Pathology  07/15/2014   Macario Golds, CCC-SLP     07/15/2014  2:05 PM Objective Swallowing Evaluation: Modified Barium Swallowing Study   Patient Details  Name: [name] [name] MRN: [id number] Date of Birth: 1941-05-29  Today's Date: 07/15/2014 Time: 1125-1145 SLP Time Calculation (min) (ACUTE ONLY): 20 min  Past Medical History:  Past Medical History  Diagnosis Date  . Coronary atherosclerosis of native coronary artery  DES to LAD and circumflex 2006  . Essential hypertension, benign   . Hyperlipidemia   . SVT (supraventricular tachycardia)     Status post RFA  . Sleep apnea   . Anxiety   . Glucose intolerance (impaired glucose tolerance)   . Osteoarthritis   . Carotid stenosis   . Long-term memory loss     Aricept and Seroquel  . GERD (gastroesophageal reflux disease)   . Hemorrhoids   . History of colon polyps   . Depression   . Insomnia   . COPD (chronic obstructive pulmonary disease)   . Skin cancer   . Nosebleed     09/15/12 - Dr Adriana Reams, ENT   . Dementia     Dr Bjorn Loser - Neurology   Past Surgical History:  Past Surgical History  Procedure Laterality Date  . Cervical spine surgery  1978    x 2  . Hemorrhoid surgery   1970  . Tonsillectomy and adenoidectomy    . 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 Circumcision      Age 29  . Back surgery    . Cataract surgery      Bilateral  . Colonoscopy    . Thumb surgery      Bilateral  . Polyp removed from nose    . Knee arthroscopy  04/03/2012    Procedure: ARTHROSCOPY KNEE;  Surgeon: Marin Shutter, MD;   Location: [place];  Service: Orthopedics;  Laterality: Right;   Right Knee Arthroscopy with Debridement   . Eye surgery    . Total knee arthroplasty Right 10/07/2012    Procedure: TOTAL KNEE ARTHROPLASTY;  Surgeon: Mauri Pole,  MD;  Location: [facility];  Service: Orthopedics;  Laterality: Right;   . Video bronchoscopy Bilateral 07/13/2014    Procedure: VIDEO BRONCHOSCOPY WITH FLUORO;  Surgeon: Tanda Rockers, MD;  Location: [facility];  Service: Cardiopulmonary;   Laterality: Bilateral;   HPI:  73 yo male adm to [facility] for planned bronchoscopy to evaluate right  lower lobe infiltrates - s/p procedure with wayne cath removed  12/9- pnemothorax.   MBS ordered to evaluate swallow ability.  Pt  with PMH + for Coronary atherosclerosis of native coronary  artery; Essential hypertension, benign; Hyperlipidemia; SVT  (supraventricular tachycardia); Sleep apnea; Anxiety; Glucose  intolerance (impaired glucose tolerance); Osteoarthritis; Carotid  stenosis; Long-term memory loss; GERD (gastroesophageal reflux  disease); Hemorrhoids; History of colon polyps; Depression;  Insomnia; COPD (chronic obstructive pulmonary disease); Skin  cancer; Nosebleed; and Dementia.  PSH + for cervical spine  surgery 1978.  Pt denies dysphagia, consumes regular/thin diet.        Assessment / Plan / Recommendation Clinical Impression  Dysphagia Diagnosis: Within Functional Limits  Clinical impression: Pt presents with functional oropharyngeal  swallow without aspiration or frank penetratin of any consistency  tested *tablet with thin, cracker, puree, thin.  Minimal amount  of laryngeal penetration  intermittently noted with thin that  cleared with further swallows due to adequate laryngeal  elevation.  Penetration was worse with mixed consistencies and  therefore advised pt to avoid mixed consistencies if problematic.   Overall swallow was timely and strong with only minimal  pharyngeal residuals.    Given pt's COPD, advised him to use caution with eating/drinking  if becomes dyspneic and to general aspiration precautions.  Using  live monitor, provided pt with results of tests and compensation  strategies.  All education completed, SLP will  sign off.  Thanks  for this consult.    Treatment Recommendation  No treatment recommended at this time    Diet Recommendation Regular;Thin liquid   Medication Administration:  (start and follow with water and pill  with pudding/puree if problematic in future ) Supervision: Patient able to self feed Compensations: Slow rate;Small sips/bites (consume liquids t/o  meal) Postural Changes and/or Swallow Maneuvers: Seated upright 90  degrees;Upright 30-60 min after meal    Other  Recommendations Oral Care Recommendations: Oral care BID   Follow Up Recommendations  None      General Date of Onset: 07/15/14 HPI: 73 yo male adm to [facility] for planned bronchoscopy to evaluate  right lower lobe infiltrates - s/p procedure with wayne cath  removed 12/9- pnemothorax.   MBS ordered to evaluate swallow  ability.  Pt with PMH + for Coronary atherosclerosis of native  coronary artery; Essential hypertension, benign; Hyperlipidemia;  SVT (supraventricular tachycardia); Sleep apnea; Anxiety; Glucose  intolerance (impaired glucose tolerance); Osteoarthritis; Carotid  stenosis; Long-term memory loss; GERD (gastroesophageal reflux  disease); Hemorrhoids; History of colon polyps; Depression;  Insomnia; COPD (chronic obstructive pulmonary disease); Skin  cancer; Nosebleed; and Dementia.  PSH + for cervical spine  surgery 1978.  Pt denies dysphagia, consumes regular/thin diet.   Type of Study:  Modified Barium Swallowing Study Reason for Referral: Objectively evaluate swallowing function Diet Prior to this Study: Regular;Thin liquids Temperature Spikes Noted: No Respiratory Status: Room air History of Recent Intubation: No Behavior/Cognition: Alert;Cooperative;Pleasant mood Oral Cavity - Dentition: Dentures, not available (pt reports  dentures in room) Oral Motor / Sensory Function: Within functional limits Self-Feeding Abilities: Able to feed self Patient Positioning: Upright in chair Baseline Vocal Quality: Clear Volitional Cough: Other (Comment) (did not elicit secondary to  medical condition and procedure) Volitional Swallow: Able to elicit Anatomy: Within functional limits (appearance of osteophyte type  structure at C6-C7) Pharyngeal Secretions: Not observed secondary MBS    Reason for Referral Objectively evaluate swallowing function   Oral Phase Oral Preparation/Oral Phase Oral Phase: WFL (pt consumes large boluses) Oral - Thin Oral - Thin Cup: Within functional limits;Piecemeal swallowing Oral - Thin Straw: Within functional limits;Piecemeal swallowing Oral - Solids Oral - Puree: Within functional limits Oral - Regular: Within functional limits;Piecemeal swallowing Oral - Pill: Within functional limits   Pharyngeal Phase Pharyngeal Phase Pharyngeal Phase: Impaired Pharyngeal - Thin Pharyngeal - Thin Cup: Penetration/Aspiration during swallow Penetration/Aspiration details (thin cup): Material enters  airway, remains ABOVE vocal cords then ejected out Pharyngeal - Thin Straw: Pharyngeal residue -  valleculae;Pharyngeal residue - pyriform  sinuses;Penetration/Aspiration during swallow Penetration/Aspiration details (thin straw): Material enters  airway, remains ABOVE vocal cords then ejected out Pharyngeal - Solids Pharyngeal - Puree: Pharyngeal residue - valleculae;Pharyngeal  residue - pyriform sinuses Pharyngeal - Regular: Within functional limits Pharyngeal - Pill: Penetration/Aspiration during  swallow Penetration/Aspiration details (pill): Material enters airway,  remains ABOVE vocal cords then ejected out (deeper penetration of  thin liquid when taking pill) Pharyngeal Phase - Comment Pharyngeal Comment: consuming liquids faciliated clearance of  minimal residuals of puree  Cervical Esophageal Phase    GO    Cervical Esophageal Phase Cervical Esophageal Phase: WFL (pt noted to belch but denies any  symptoms of reflux)         Luanna Salk, MS [facility] SLP [phone]  SLP spoke to Russell with [facility] and he reports spouse states pt chokes  nearly every meal requiring him to get up and leave the table.  As spouse was not present during [place], SLP visited pt/spouse to  provide further education re: swallowing/compensation strategies  for maximal airway protection.  Reviewed results of MBS and  provided education in writing.     Disposition:  03-Skilled Nursing Facility      Discharge Instructions    Diet - low sodium heart healthy    Complete by:  As directed   Regular;Thin liquid  Medication Administration: (start and follow with water and pill with pudding/puree if problematic in future) Supervision: Patient able to self feed Compensations: Slow rate;Small sips/bites (consume liquids t/o meal) Postural Changes and/or Swallow Maneuvers: Seated upright 90 degrees;Upright 30-60 min after meal     Increase activity slowly    Complete by:  As directed             Medication List    TAKE these medications        aspirin 81 MG tablet  Take 81 mg by mouth every morning.     atorvastatin 20 MG tablet  Commonly known as:  LIPITOR  Take 20 mg by mouth at bedtime.     donepezil 10 MG tablet  Commonly known as:  ARICEPT  Take 30 mg by mouth at bedtime.     escitalopram 20 MG tablet  Commonly known as:  LEXAPRO  Take 10 mg by mouth every morning.     LORazepam 1 MG tablet  Commonly known as:  ATIVAN  Take 1 mg by mouth 2 (two) times daily. 1 tablet in am and 2 tablets at hs     Lysine  500 MG Caps  Take 500 mg by mouth daily.     megestrol 40 MG tablet  Commonly known as:  MEGACE  Take 40 mg by mouth 2 (two) times daily.     montelukast 10 MG tablet  Commonly known as:  SINGULAIR  Take 10 mg by mouth every morning.     niacin 500 MG CR capsule  Take 500 mg by mouth at bedtime.     oxybutynin 5 MG tablet  Commonly known as:  DITROPAN  Take 5 mg by mouth daily.     pantoprazole 40 MG tablet  Commonly known as:  PROTONIX  Take 40 mg by mouth daily.     QUEtiapine 200 MG tablet  Commonly known as:  SEROQUEL  Take 200 mg by mouth at bedtime.     tamsulosin 0.4 MG Caps capsule  Commonly known as:  FLOMAX  Take 0.4 mg by mouth daily.     vitamin B-12 1000 MCG tablet  Commonly known as:  CYANOCOBALAMIN  Take 1,000 mcg by mouth daily.       Follow-up Information    Follow up with Christinia Gully, MD On 07/26/2014.   Specialty:  Pulmonary Disease   Why:  1030am    Contact information:   [address] [address] [phone]       Discharged Condition: good   Signed: BABCOCK,PETE 07/15/2014, 2:11 PM   Physician Statement:   The Patient was personally examined, the discharge assessment and plan has been personally reviewed and I agree with ACNP Babcock's assessment and plan. > 30 minutes of time have been dedicated to discharge assessment, planning and discharge instructions.   Rigoberto Noel MD

## 2014-07-26 ENCOUNTER — Encounter: Payer: Self-pay | Admitting: Internal Medicine

## 2014-07-26 ENCOUNTER — Ambulatory Visit (INDEPENDENT_AMBULATORY_CARE_PROVIDER_SITE_OTHER)
Admission: RE | Admit: 2014-07-26 | Discharge: 2014-07-26 | Disposition: A | Discharge status: Home / Self Care | Payer: Medicare Other | Source: Ambulatory Visit | Attending: Internal Medicine | Admitting: Internal Medicine

## 2014-07-26 ENCOUNTER — Ambulatory Visit (INDEPENDENT_AMBULATORY_CARE_PROVIDER_SITE_OTHER): Payer: Medicare Other | Admitting: Internal Medicine

## 2014-07-26 VITALS — BP 118/70 | HR 70 | Ht 67.0 in | Wt 163.4 lb

## 2014-07-26 DIAGNOSIS — R918 Other nonspecific abnormal finding of lung field: Secondary | ICD-10-CM

## 2014-07-26 DIAGNOSIS — J939 Pneumothorax, unspecified: Secondary | ICD-10-CM

## 2014-07-26 DIAGNOSIS — Z23 Encounter for immunization: Secondary | ICD-10-CM

## 2014-07-26 NOTE — Progress Notes (Signed)
Subjective:    Patient ID: [name] [name], male    DOB: 1941/01/27  MRN: [id number]    Brief patient profile:  5 yowm quit smoking around 2000 no trouble at all except for little coughing until  Winter of 2015 then sob in summer 2015 still did yard work then cxr done > referred 07/06/2014 to pulmonary clinic by Dr Melina Copa  For abn cxr > CT c/w bronchiectasis/ MAI can't r/o tumor.   History of Present Illness  07/06/2014 1st [facility] office visit/ [name]   Chief Complaint  Patient presents with  . Pulmonary Consult    Referred by Dr. Octavio Graves for eval of lung mass. Pt c/o cough for the past 6-12 months- prod with minimal light yellow sputum. He also c/o DOE with walking minimal distances such as from room to room at home. He also c/o poor appetite.   onset of symptoms was indolent, min progressive, assoc with gen cognitive decline per wife and pt not able to directly remember any of his symptoms and tends to minimize them.  Some difficulty swallowing but no frank asp, no pleuritic cp or hemoptysis rec FOB >  - FOB for 07/13/14 >  Neg cyt/ neg path/ penicillium on fungal> complicated by R ptx requiring 24 h Chest tube, resolved     07/26/2014 post hosp f/u ov/Makani Seckman re: RLL as dz assoc with bronchiectasis  Chief Complaint  Patient presents with  . HFU    pt c/o sob. He was discharged from hospital 2 weeks ago. He denies chest tightness, cough and wheezing.    walkng dog  x 20 min once daily slow pace, no hills, no sob  No obvious day to day or daytime variabilty or assoc chronic cough or cp or chest tightness, subjective wheeze overt sinus or hb symptoms. No unusual exp hx or h/o childhood pna/ asthma or knowledge of premature birth.  Sleeping ok without nocturnal  or early am exacerbation  of respiratory  c/o's or need for noct saba. Also denies any obvious fluctuation of symptoms with weather or environmental changes or other aggravating or alleviating factors except as  outlined above   Current Medications, Allergies, Complete Past Medical History, Past Surgical History, Family History, and Social History were reviewed in [employer] record.  ROS  The following are not active complaints unless bolded sore throat, dysphagia, dental problems, itching, sneezing,  nasal congestion or excess/ purulent secretions, ear ache,   fever, chills, sweats, unintended wt loss, pleuritic or exertional cp, hemoptysis,  orthopnea pnd or leg swelling, presyncope, palpitations, heartburn, abdominal pain, anorexia, nausea, vomiting, diarrhea  or change in bowel or urinary habits, change in stools or urine, dysuria,hematuria,  rash, arthralgias, visual complaints, headache, numbness weakness or ataxia or problems with walking or coordination,  change in mood/affect or memory.                           Objective:   Physical Exam  Alert but slow / inaccurate with responses    07/26/2014      163  Wt Readings from Last 3 Encounters:  07/06/14 162 lb (73.483 kg)  03/16/13 164 lb 12 oz (74.73 kg)  10/07/12 168 lb (76.204 kg)    Vital signs reviewed    HEENT: nl dentition, turbinates, and orophanx. Nl external ear canals without cough reflex   NECK :  without JVD/Nodes/TM/ nl carotid upstrokes bilaterally   LUNGS: no acc  muscle use, clear to A and P bilaterally without cough on insp or exp maneuvers   CV:  RRR  no s3 or murmur or increase in P2, no edema   ABD:  soft and nontender with nl excursion in the supine position. No bruits or organomegaly, bowel sounds nl  MS:  warm without deformities, calf tenderness, cyanosis or clubbing  SKIN: warm and dry without lesions    NEURO:  alert, approp, no deficits    CXR 06/22/14 new RLL density   CT 06/24/14 bronchiectasis with dense patches RLL sup and post basal segment  cxr 07/26/14  The lungs remain mildly hyperinflated. There is no focal infiltrate. There is no pleural effusion or  pneumothorax. The heart and mediastinal structures are normal. The bony thorax is unremarkable.     Assessment & Plan:

## 2014-07-26 NOTE — Patient Instructions (Signed)
Prevnar today is the last pneumonia shot you'll ever need  Bronchiectasis =   you have scarring of your bronchial tubes which means that they don't function perfectly normally and mucus tends to pool in certain areas of your lung which can cause pneumonia and further scarring of your lung and bronchial tubes  Whenever you develop cough congestion take mucinex or mucinex dm > these will help keep the mucus loose and flowing but if your condition worsens you need to seek help immediately preferably here or somewhere inside the [facility] system to compare xrays ( worse = darker or bloody mucus or pain on breathing in)    Please remember to go to the  x-ray department downstairs for your tests - we will call you with the results when they are available.     Please schedule a follow up visit in 3 months but call sooner if needed

## 2014-07-26 NOTE — Progress Notes (Signed)
Quick Note:  Spoke with pt and notified of results per Dr. Wert. Pt verbalized understanding and denied any questions.  ______ 

## 2014-07-27 ENCOUNTER — Encounter: Payer: Self-pay | Admitting: Internal Medicine

## 2014-07-27 NOTE — Assessment & Plan Note (Signed)
-   See CT chest [facility] RLL seg / post segment assoc with bronchiectasis - FOB for 07/13/14 >  Neg cyt/ neg path/ penicillium on fungal - f/u cxr 07/26/14 improved cxr on lateral view  I had an extended summary  discussion with the patient and wife reviewing all relevant studies completed to date and  lasting 15 to 20 minutes of a 25 minute visit on the following ongoing concerns:   1) no evidence at all to suggest ca or TB 2) the assoc bronchiectasis in RLL does place him at risk of recurrent infection and if aspirates at all will be much more difficult to clear s complications 3) prevnar approp > agreed to receive it today  Pathophysiology of bronchiectasis reviewed/ f/u q 3 m for now

## 2014-07-27 NOTE — Assessment & Plan Note (Signed)
Occurred 07/13/14 on R p TBBX > completely resolved p 24 h chest tube/ no residual ptx at f/u 07/26/14

## 2014-08-09 LAB — FUNGUS CULTURE W SMEAR
Fungal Smear: NONE SEEN
Special Requests: NORMAL

## 2014-08-25 LAB — AFB CULTURE WITH SMEAR (NOT AT ARMC)
Acid Fast Smear: NONE SEEN
Special Requests: NORMAL

## 2014-09-14 ENCOUNTER — Encounter: Payer: Self-pay | Admitting: *Deleted

## 2014-09-15 ENCOUNTER — Encounter: Payer: Self-pay | Admitting: *Deleted

## 2014-09-15 ENCOUNTER — Encounter: Payer: Self-pay | Admitting: Cardiovascular Disease

## 2014-09-15 ENCOUNTER — Ambulatory Visit (INDEPENDENT_AMBULATORY_CARE_PROVIDER_SITE_OTHER): Payer: Medicare Other | Admitting: Cardiovascular Disease

## 2014-09-15 VITALS — BP 118/70 | HR 68 | Ht 68.0 in | Wt 166.0 lb

## 2014-09-15 DIAGNOSIS — R079 Chest pain, unspecified: Secondary | ICD-10-CM

## 2014-09-15 DIAGNOSIS — I10 Essential (primary) hypertension: Secondary | ICD-10-CM

## 2014-09-15 DIAGNOSIS — Z87898 Personal history of other specified conditions: Secondary | ICD-10-CM

## 2014-09-15 DIAGNOSIS — Z955 Presence of coronary angioplasty implant and graft: Secondary | ICD-10-CM

## 2014-09-15 DIAGNOSIS — Z9289 Personal history of other medical treatment: Secondary | ICD-10-CM

## 2014-09-15 DIAGNOSIS — I6523 Occlusion and stenosis of bilateral carotid arteries: Secondary | ICD-10-CM

## 2014-09-15 DIAGNOSIS — I25118 Atherosclerotic heart disease of native coronary artery with other forms of angina pectoris: Secondary | ICD-10-CM

## 2014-09-15 DIAGNOSIS — E785 Hyperlipidemia, unspecified: Secondary | ICD-10-CM

## 2014-09-15 NOTE — Progress Notes (Signed)
Patient ID: [name] [name], male   DOB: Sep 24, 1940, 74 y.o.   MRN: [id number]      SUBJECTIVE: The patient is a 74 year old male who was most recently seen by my colleague, Dr. Domenic Polite in August 2014. He reportedly has a history of CAD with LAD and left circumflex coronary artery stents in 2006, as well as hypertension, hyperlipidemia, SVT s/p ablation, and bilateral carotid artery occlusion (although the patient and wife have no knowledge of this). He has COPD and bronchiectasis and is followed by pulmonary.  Most recent nuclear study I find was performed in 2/07 which demonstrated a fixed inferior wall defect and a minimally reversible apical defect, but deemed a low risk study overall. He was reportedly hospitalized at [facility] recently, but these records are unavailable to me. His wife, [name], tells me her husband was hospitalized for chest pain. He reportedly ruled out for an acute coronary syndrome and no cardiovascular testing was performed. Dr. Ronne Binning apparently referred him to me. He apparently had chest pain while at rest at home and took 2 sublingual nitroglycerin tablets which did not provide any relief, and then went to [facility]. He presently denies any chest pain. He has chronic exertional dyspnea which is unchanged. He used to walk 2 miles daily but does not do so anymore due to "old age", and not chest pain per se. ECG performed on for every 6 demonstrated normal sinus rhythm with LVH.  Soc: He and his wife live in [place]. 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   Review of Systems: As per "subjective", otherwise negative.  No Known Allergies  Current Outpatient Prescriptions  Medication Sig Dispense Refill  . aspirin 81 MG tablet Take 81 mg by mouth every morning.     Marland Kitchen atorvastatin (LIPITOR) 20 MG tablet Take 20 mg by mouth at bedtime.     . donepezil (ARICEPT) 10 MG tablet Take 30 mg by mouth at bedtime.     Marland Kitchen  escitalopram (LEXAPRO) 20 MG tablet Take 10 mg by mouth every morning.     Marland Kitchen ibuprofen (ADVIL,MOTRIN) 800 MG tablet Take 1 tablet by mouth as needed.    Marland Kitchen LORazepam (ATIVAN) 1 MG tablet Take 1 mg by mouth 2 (two) times daily. 1 tablet in am and 2 tablets at hs    . Lysine 500 MG CAPS Take 500 mg by mouth daily.     . megestrol (MEGACE) 40 MG tablet Take 40 mg by mouth 2 (two) times daily.    . montelukast (SINGULAIR) 10 MG tablet Take 10 mg by mouth every morning.    . niacin 500 MG CR capsule Take 500 mg by mouth at bedtime.     Marland Kitchen NITROSTAT 0.4 MG SL tablet Place 0.4 mg under the tongue as needed.    Marland Kitchen oxybutynin (DITROPAN) 5 MG tablet Take 5 mg by mouth daily.    . pantoprazole (PROTONIX) 40 MG tablet Take 40 mg by mouth daily.     . QUEtiapine (SEROQUEL) 200 MG tablet Take 200 mg by mouth at bedtime.    . Tamsulosin HCl (FLOMAX) 0.4 MG CAPS Take 0.4 mg by mouth daily.     . traMADol (ULTRAM) 50 MG tablet Take 1 tablet by mouth as needed.    . vitamin B-12 (CYANOCOBALAMIN) 1000 MCG tablet Take 1,000 mcg by mouth daily.     No current facility-administered medications for this visit.    Past Medical History  Diagnosis Date  . Coronary atherosclerosis of native coronary artery     DES to LAD and circumflex 2006  . Essential hypertension, benign   . Hyperlipidemia   . SVT (supraventricular tachycardia)     Status post RFA  . Sleep apnea   . Anxiety   . Glucose intolerance (impaired glucose tolerance)   . Osteoarthritis   . Carotid stenosis   . Long-term memory loss     Aricept and Seroquel  . GERD (gastroesophageal reflux disease)   . Hemorrhoids   . History of colon polyps   . Depression   . Insomnia   . COPD (chronic obstructive pulmonary disease)   . Skin cancer   . Nosebleed     09/15/12 - Dr Adriana Reams, ENT   . Dementia     Dr Bjorn Loser - Neurology    Past Surgical History  Procedure Laterality Date  . Cervical spine surgery  1978    x 2  . Hemorrhoid surgery  1970    . Tonsillectomy and adenoidectomy    . 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 Circumcision      Age 55  . Back surgery    . Cataract surgery      Bilateral  . Colonoscopy    . Thumb surgery      Bilateral  . Polyp removed from nose    . Knee arthroscopy  04/03/2012    Procedure: ARTHROSCOPY KNEE;  Surgeon: Marin Shutter, MD;  Location: [facility];  Service: Orthopedics;  Laterality: Right;  Right Knee Arthroscopy with Debridement   . Eye surgery    . Total knee arthroplasty Right 10/07/2012    Procedure: TOTAL KNEE ARTHROPLASTY;  Surgeon: Mauri Pole, MD;  Location: [facility];  Service: Orthopedics;  Laterality: Right;  . Video bronchoscopy Bilateral 07/13/2014    Procedure: VIDEO BRONCHOSCOPY WITH FLUORO;  Surgeon: Tanda Rockers, MD;  Location: [facility];  Service: Cardiopulmonary;  Laterality: Bilateral;    History   Social History  . Marital Status: Married    Spouse Name: N/A  . Number of Children: N/A  . Years of Education: N/A   Occupational History  . Retired    Social History Main Topics  . Smoking status: Former Smoker -- 1.50 packs/day for 35 years    Types: Cigarettes    Quit date: 08/07/1999  . Smokeless tobacco: Never Used  . Alcohol Use: No  . Drug Use: No  . Sexual Activity: Yes   Other Topics Concern  . Not on file   Social History Narrative     Filed Vitals:   09/15/14 1252  BP: 118/70  Pulse: 68  Height: 5\' 8"  (1.727 m)  Weight: 166 lb (75.297 kg)  SpO2: 98%    PHYSICAL EXAM General: NAD HEENT: Normal. Neck: No JVD, no thyromegaly. Lungs: Clear to auscultation bilaterally with normal respiratory effort. CV: Nondisplaced PMI.  Regular rate and rhythm, normal S1/S2, no S3/S4, no murmur. No pretibial or periankle edema.  No carotid bruit.  Normal pedal pulses.  Abdomen: Soft, nontender, no hepatosplenomegaly, no distention.  Neurologic: Alert and oriented x 3.  Psych: Normal affect. Skin: Normal. Musculoskeletal: Normal range  of motion, no gross deformities. Extremities: No clubbing or cyanosis.   ECG: Most recent ECG reviewed.      ASSESSMENT AND PLAN: 1. CAD with prior stents: Currently denies chest pain. I will attempt to obtain hospital records from [facility]. Will obtain  an exercise Cardiolite stress test to evaluate for ischemia. Will also obtain an echocardiogram to evaluate left ventricular systolic function and regional wall motion. Continue aspirin and Lipitor. 2. Essential HTN: Well controlled. No changes. 3. Hyperlipidemia: Continue statin therapy. Will see if lipids were performed during hospitalization. 4. SVT s/p ablation: Symptomatically stable. 5. Carotid artery occlusion: I cannot locate recent Dopplers and am uncertain about this reported history.  Dispo: f/u in 1 month.  Kate Sable, M.D., F.A.C.C.

## 2014-09-15 NOTE — Patient Instructions (Signed)
Your physician has requested that you have an echocardiogram. Echocardiography is a painless test that uses sound waves to create images of your heart. It provides your doctor with information about the size and shape of your heart and how well your heart's chambers and valves are working. This procedure takes approximately one hour. There are no restrictions for this procedure. Your physician has requested that you have en exercise stress myoview. For further information please visit [email or web address]. Please follow instruction sheet, as given. Office will contact with results via phone or letter.   Continue all current medications. Follow up in  1 month

## 2014-09-27 ENCOUNTER — Encounter (HOSPITAL_COMMUNITY): Payer: Self-pay

## 2014-09-27 ENCOUNTER — Ambulatory Visit (HOSPITAL_COMMUNITY)
Admission: RE | Admit: 2014-09-27 | Discharge: 2014-09-27 | Disposition: A | Discharge status: Home / Self Care | Payer: Medicare Other | Source: Ambulatory Visit | Attending: Cardiovascular Disease | Admitting: Cardiovascular Disease

## 2014-09-27 ENCOUNTER — Encounter (HOSPITAL_COMMUNITY)
Admission: RE | Admit: 2014-09-27 | Discharge: 2014-09-27 | Disposition: A | Discharge status: Home / Self Care | Payer: Medicare Other | Source: Ambulatory Visit | Attending: Cardiovascular Disease | Admitting: Cardiovascular Disease

## 2014-09-27 DIAGNOSIS — I25118 Atherosclerotic heart disease of native coronary artery with other forms of angina pectoris: Secondary | ICD-10-CM | POA: Insufficient documentation

## 2014-09-27 DIAGNOSIS — Z955 Presence of coronary angioplasty implant and graft: Secondary | ICD-10-CM

## 2014-09-27 DIAGNOSIS — R079 Chest pain, unspecified: Secondary | ICD-10-CM | POA: Insufficient documentation

## 2014-09-27 DIAGNOSIS — I2510 Atherosclerotic heart disease of native coronary artery without angina pectoris: Secondary | ICD-10-CM | POA: Insufficient documentation

## 2014-09-27 MED ORDER — TECHNETIUM TC 99M SESTAMIBI GENERIC - CARDIOLITE
10.0000 | Freq: Once | INTRAVENOUS | Status: AC | PRN
  Administered 2014-09-27: 10 via INTRAVENOUS

## 2014-09-27 MED ORDER — REGADENOSON 0.4 MG/5ML IV SOLN
INTRAVENOUS | Status: AC
  Filled 2014-09-27: qty 5

## 2014-09-27 MED ORDER — SODIUM CHLORIDE 0.9 % IJ SOLN
INTRAMUSCULAR | Status: AC
  Filled 2014-09-27: qty 42

## 2014-09-27 MED ORDER — TECHNETIUM TC 99M SESTAMIBI - CARDIOLITE
30.0000 | Freq: Once | INTRAVENOUS | Status: AC | PRN
  Administered 2014-09-27: 30 via INTRAVENOUS

## 2014-09-27 MED ORDER — SODIUM CHLORIDE 0.9 % IJ SOLN
10.0000 mL | INTRAMUSCULAR | Status: DC | PRN
  Administered 2014-09-27: 10 mL via INTRAVENOUS
  Filled 2014-09-27: qty 10

## 2014-09-27 MED ORDER — SODIUM CHLORIDE 0.9 % IJ SOLN
INTRAMUSCULAR | Status: AC
  Administered 2014-09-27: 10 mL via INTRAVENOUS
  Filled 2014-09-27: qty 3

## 2014-09-27 NOTE — Progress Notes (Signed)
*  PRELIMINARY RESULTS* Echocardiogram 2D Echocardiogram has been performed.  [name] [name] 09/27/2014, 9:02 AM

## 2014-09-27 NOTE — Progress Notes (Signed)
Stress Lab Nurses Notes - Emrah Ariola 09/27/2014 Reason for doing test: CAD and Chest Pain Type of test: Stress Cardiolite Nurse performing test: Gerrit Halls, RN Nuclear Medicine Tech: Redmond Baseman Echo Tech: Not Applicable MD performing test: Branch/K.Purcell Nails NP Family MD: Octavio Graves, DO Test explained and consent signed: Yes.   IV started: Saline lock flushed, No redness or edema and Saline lock started in radiology Symptoms: SOB Treatment/Intervention: None Reason test stopped: protocol completed After recovery IV was: Discontinued via X-ray tech and No redness or edema Patient to return to Nuc. Med at : 12:30 Patient discharged: Home Patient's Condition upon discharge was: stable Comments: During test peak BP 156/69 & HR 141.  Recovery BP 142/95 & HR 62.  Symptoms resolved in recovery. Geanie Cooley T

## 2014-09-28 ENCOUNTER — Telehealth: Payer: Self-pay | Admitting: *Deleted

## 2014-09-28 NOTE — Telephone Encounter (Signed)
Notes Recorded by Laurine Blazer, LPN on 5/73/2202 at 54:27 AM Wife Ellsworth Lennox) notified. Already has follow up with Dr. Bronson Ing for 10/11/14.

## 2014-09-28 NOTE — Telephone Encounter (Signed)
Echo -   Notes Recorded by Herminio Commons, MD on 09/27/2014 at 2:34 PM Normal pumping function with some mild valve leakage. Will continue to monitor clinically.  ==========================================================================  Stress Test -   Notes Recorded by Herminio Commons, MD on 09/27/2014 at 5:05 PM Normal.

## 2014-10-11 ENCOUNTER — Encounter: Payer: Self-pay | Admitting: Cardiovascular Disease

## 2014-10-11 ENCOUNTER — Ambulatory Visit (INDEPENDENT_AMBULATORY_CARE_PROVIDER_SITE_OTHER): Payer: Medicare Other | Admitting: Cardiovascular Disease

## 2014-10-11 VITALS — BP 110/70 | HR 65 | Ht 68.0 in | Wt 169.0 lb

## 2014-10-11 DIAGNOSIS — I10 Essential (primary) hypertension: Secondary | ICD-10-CM

## 2014-10-11 DIAGNOSIS — Z8679 Personal history of other diseases of the circulatory system: Secondary | ICD-10-CM

## 2014-10-11 DIAGNOSIS — Z955 Presence of coronary angioplasty implant and graft: Secondary | ICD-10-CM

## 2014-10-11 DIAGNOSIS — Z136 Encounter for screening for cardiovascular disorders: Secondary | ICD-10-CM

## 2014-10-11 DIAGNOSIS — I2510 Atherosclerotic heart disease of native coronary artery without angina pectoris: Secondary | ICD-10-CM

## 2014-10-11 DIAGNOSIS — I6523 Occlusion and stenosis of bilateral carotid arteries: Secondary | ICD-10-CM

## 2014-10-11 DIAGNOSIS — E785 Hyperlipidemia, unspecified: Secondary | ICD-10-CM | POA: Diagnosis not present

## 2014-10-11 DIAGNOSIS — IMO0001 Reserved for inherently not codable concepts without codable children: Secondary | ICD-10-CM

## 2014-10-11 NOTE — Addendum Note (Signed)
Addended by: Laurine Blazer on: 10/11/2014 02:12 PM   Modules accepted: Level of Service

## 2014-10-11 NOTE — Patient Instructions (Signed)
Continue all current medications. Your physician wants you to follow up in: 6 months.  You will receive a reminder letter in the mail one-two months in advance.  If you don't receive a letter, please call our office to schedule the follow up appointment   

## 2014-10-11 NOTE — Progress Notes (Signed)
Patient ID: [name] [name] [name], male   DOB: 03/16/1941, 74 y.o.   MRN: [id number]      SUBJECTIVE: The patient present for follow up after undergoing cardiovascular testing.  He reportedly has a history of CAD with LAD and left circumflex coronary artery stents in 2006, as well as hypertension, hyperlipidemia, SVT s/p ablation, and bilateral carotid artery occlusion (although the patient and wife have no knowledge of this). He has COPD and bronchiectasis and is followed by pulmonary.   He is here with his wife, "[name] [name]" ([name] [name] [name]). He has chronic exertional dyspnea which is unchanged. He used to walk 2 miles daily but does not do so anymore due to "old age", and not chest pain per se.  Nuclear stress testing on 09/27/14 was normal with no evidence of ischemia or scar and normal LV systolic function. Echocardiogram demonstrated normal left ventricular systolic function, EF 10-62%, with mild to moderate aortic regurgitation.  He is feeling well and denies chest pain and shortness of breath.   Soc: He and his wife live in [place]. 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   Review of Systems: As per "subjective", otherwise negative.  No Known Allergies  Current Outpatient Prescriptions  Medication Sig Dispense Refill  . aspirin 81 MG tablet Take 81 mg by mouth every morning.     Marland Kitchen atorvastatin (LIPITOR) 20 MG tablet Take 20 mg by mouth at bedtime.     . donepezil (ARICEPT) 10 MG tablet Take 30 mg by mouth at bedtime.     Marland Kitchen escitalopram (LEXAPRO) 20 MG tablet Take 10 mg by mouth every morning.     Marland Kitchen ibuprofen (ADVIL,MOTRIN) 800 MG tablet Take 1 tablet by mouth as needed.    Marland Kitchen LORazepam (ATIVAN) 1 MG tablet Take 1 mg by mouth 2 (two) times daily. 1 tablet in am and 2 tablets at hs    . Lysine 500 MG CAPS Take 500 mg by mouth daily.     . megestrol (MEGACE) 40 MG tablet Take 40 mg by mouth 2 (two) times daily.    . montelukast (SINGULAIR) 10 MG tablet Take 10 mg by mouth  every morning.    . niacin 500 MG CR capsule Take 500 mg by mouth at bedtime.     Marland Kitchen NITROSTAT 0.4 MG SL tablet Place 0.4 mg under the tongue as needed.    Marland Kitchen oxybutynin (DITROPAN) 5 MG tablet Take 5 mg by mouth daily.    . pantoprazole (PROTONIX) 40 MG tablet Take 40 mg by mouth daily.     . QUEtiapine (SEROQUEL) 200 MG tablet Take 200 mg by mouth at bedtime.    . Tamsulosin HCl (FLOMAX) 0.4 MG CAPS Take 0.4 mg by mouth daily.     . traMADol (ULTRAM) 50 MG tablet Take 1 tablet by mouth as needed.    . vitamin B-12 (CYANOCOBALAMIN) 1000 MCG tablet Take 1,000 mcg by mouth daily.     No current facility-administered medications for this visit.    Past Medical History  Diagnosis Date  . Coronary atherosclerosis of native coronary artery     DES to LAD and circumflex 2006  . Essential hypertension, benign   . Hyperlipidemia   . SVT (supraventricular tachycardia)     Status post RFA  . Sleep apnea   . Anxiety   . Glucose intolerance (impaired glucose tolerance)   . Osteoarthritis   . Carotid stenosis   . Long-term memory loss  Aricept and Seroquel  . GERD (gastroesophageal reflux disease)   . Hemorrhoids   . History of colon polyps   . Depression   . Insomnia   . COPD (chronic obstructive pulmonary disease)   . Skin cancer   . Nosebleed     09/15/12 - Dr Adriana Reams, ENT   . Dementia     Dr Bjorn Loser - Neurology    Past Surgical History  Procedure Laterality Date  . Cervical spine surgery  1978    x 2  . Hemorrhoid surgery  1970  . Tonsillectomy and adenoidectomy    . 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 Circumcision      Age 82  . Back surgery    . Cataract surgery      Bilateral  . Colonoscopy    . Thumb surgery      Bilateral  . Polyp removed from nose    . Knee arthroscopy  04/03/2012    Procedure: ARTHROSCOPY KNEE;  Surgeon: Marin Shutter, MD;  Location: [facility];  Service: Orthopedics;  Laterality: Right;  Right Knee Arthroscopy with Debridement     . Eye surgery    . Total knee arthroplasty Right 10/07/2012    Procedure: TOTAL KNEE ARTHROPLASTY;  Surgeon: Mauri Pole, MD;  [facility];  Service: Orthopedics;  Laterality: Right;  . Video bronchoscopy Bilateral 07/13/2014    Procedure: VIDEO BRONCHOSCOPY WITH FLUORO;  Surgeon: Tanda Rockers, MD;  [facility];  Service: Cardiopulmonary;  Laterality: Bilateral;    History   Social History  . Marital Status: Married    Spouse Name: N/A  . Number of Children: N/A  . Years of Education: N/A   Occupational History  . Retired    Social History Main Topics  . Smoking status: Former Smoker -- 1.50 packs/day for 35 years    Types: Cigarettes    Quit date: 08/07/1999  . Smokeless tobacco: Never Used  . Alcohol Use: No  . Drug Use: No  . Sexual Activity: Yes   Other Topics Concern  . Not on file   Social History Narrative     Filed Vitals:   10/11/14 1357  BP: 110/70  Pulse: 65  Height: 5\' 8"  (1.727 m)  Weight: 169 lb (76.658 kg)  SpO2: 98%    PHYSICAL EXAM General: NAD HEENT: Normal. Neck: No JVD, no thyromegaly. Lungs: Clear to auscultation bilaterally with normal respiratory effort. CV: Nondisplaced PMI.  Regular rate and rhythm, normal S1/S2, no S3/S4, no murmur. No pretibial or periankle edema.  No carotid bruit.  Normal pedal pulses.  Abdomen: Soft, nontender, no hepatosplenomegaly, no distention.  Neurologic: Alert and oriented x 3.  Psych: Normal affect. Skin: Normal. Musculoskeletal: Normal range of motion, no gross deformities. Extremities: No clubbing or cyanosis.   ECG: Most recent ECG reviewed.      ASSESSMENT AND PLAN: 1. CAD with prior stents: Currently denies chest pain. Exercise Cardiolite stress test and echocardiogram results noted above. Continue aspirin and Lipitor. 2. Essential HTN: Well controlled. No changes. 3. Hyperlipidemia: Continue statin and niacin therapy.  4. SVT s/p ablation: Symptomatically stable. 5.  Carotid artery occlusion: I cannot locate recent Dopplers and am uncertain about this reported history.  Dispo: f/u in 6 months.   Kate Sable, M.D., F.A.C.C.

## 2014-10-13 ENCOUNTER — Ambulatory Visit: Payer: Medicare Other | Admitting: Cardiovascular Disease

## 2014-10-25 ENCOUNTER — Ambulatory Visit (INDEPENDENT_AMBULATORY_CARE_PROVIDER_SITE_OTHER): Payer: Medicare Other | Admitting: Internal Medicine

## 2014-10-25 ENCOUNTER — Encounter: Payer: Self-pay | Admitting: Internal Medicine

## 2014-10-25 ENCOUNTER — Ambulatory Visit (INDEPENDENT_AMBULATORY_CARE_PROVIDER_SITE_OTHER)
Admission: RE | Admit: 2014-10-25 | Discharge: 2014-10-25 | Disposition: A | Discharge status: Home / Self Care | Payer: Medicare Other | Source: Ambulatory Visit | Attending: Internal Medicine | Admitting: Internal Medicine

## 2014-10-25 DIAGNOSIS — I6523 Occlusion and stenosis of bilateral carotid arteries: Secondary | ICD-10-CM | POA: Diagnosis not present

## 2014-10-25 DIAGNOSIS — R918 Other nonspecific abnormal finding of lung field: Secondary | ICD-10-CM

## 2014-10-25 MED ORDER — AMOXICILLIN-POT CLAVULANATE 875-125 MG PO TABS: 1.0000 | ORAL_TABLET | Freq: Two times a day (BID) | ORAL | Status: DC

## 2014-10-25 NOTE — Progress Notes (Signed)
Subjective:    Patient ID: [name] [name], male    DOB: [birth year]  MRN: [id number]    Brief patient profile:  64 yowm quit smoking around 2000 no trouble at all except for little coughing until  Winter of 2015 then sob in summer 2015 still did yard work then cxr done > referred 07/06/2014 to pulmonary clinic by Dr Melina Copa  For abn cxr > CT c/w bronchiectasis/ MAI can't r/o tumor.   History of Present Illness  07/06/2014 1st [facility] office visit/ [name] [name]   Chief Complaint  Patient presents with  . Pulmonary Consult    Referred by Dr. Octavio Graves for eval of lung mass. Pt c/o cough for the past 6-12 months- prod with minimal light yellow sputum. He also c/o DOE with walking minimal distances such as from room to room at home. He also c/o poor appetite.   onset of symptoms was indolent, min progressive, assoc with gen cognitive decline per wife and pt not able to directly remember any of his symptoms and tends to minimize them.  Some difficulty swallowing but no frank asp, no pleuritic cp or hemoptysis rec FOB >  - FOB for 07/13/14 >  Neg cyt/ neg path/ penicillium sp on fungal> complicated by R ptx requiring 24 h Chest tube, resolved     07/26/2014 post hosp f/u ov/[name] [name] re: RLL as dz assoc with bronchiectasis  Chief Complaint  Patient presents with  . HFU    pt c/o sob. He was discharged from hospital 2 weeks ago. He denies chest tightness, cough and wheezing.  walkng dog  x 20 min once daily slow pace, no hills, no sob rec Prevnar today is the last pneumonia shot you'll ever need Bronchiectasis =   you have scarring of your bronchial tubes    10/25/2014 f/u ov/[name] [name] re: bronchiectasis  Chief Complaint  Patient presents with  . Follow-up    Not limited by breathing from desired activities  /intermittently problems with swallowing/ coughing but not frank aspiration   No obvious day to day or daytime variabilty or assoc chronic cough or cp or chest tightness, subjective wheeze  overt sinus or hb symptoms. No unusual exp hx or h/o childhood pna/ asthma or knowledge of premature birth.  Sleeping ok without nocturnal  or early am exacerbation  of respiratory  c/o's or need for noct saba. Also denies any obvious fluctuation of symptoms with weather or environmental changes or other aggravating or alleviating factors except as outlined above   Current Medications, Allergies, Complete Past Medical History, Past Surgical History, Family History, and Social History were reviewed in [employer] record.  ROS  The following are not active complaints unless bolded sore throat, dysphagia, dental problems, itching, sneezing,  nasal congestion or excess/ purulent secretions, ear ache,   fever, chills, sweats, unintended wt loss, pleuritic or exertional cp, hemoptysis,  orthopnea pnd or leg swelling, presyncope, palpitations, heartburn, abdominal pain, anorexia, nausea, vomiting, diarrhea  or change in bowel or urinary habits, change in stools or urine, dysuria,hematuria,  rash, arthralgias, visual complaints, headache, numbness weakness or ataxia or problems with walking or coordination,  change in mood/affect or memory.                   Objective:   Physical Exam  amb wm nad    07/26/2014      163 >   10/25/2014   175  Wt Readings from Last 3 Encounters:  07/06/14 162  lb (73.483 kg)  03/16/13 164 lb 12 oz (74.73 kg)  10/07/12 168 lb (76.204 kg)    Vital signs reviewed    HEENT: nl dentition, turbinates, and orophanx. Nl external ear canals without cough reflex   NECK :  without JVD/Nodes/TM/ nl carotid upstrokes bilaterally   LUNGS: no acc muscle use, clear to A and P bilaterally without cough on insp or exp maneuvers   CV:  RRR  no s3 or murmur or increase in P2, no edema   ABD:  soft and nontender with nl excursion in the supine position. No bruits or organomegaly, bowel sounds nl  MS:  warm without deformities, calf tenderness, cyanosis  or clubbing  SKIN: warm and dry without lesions    NEURO:  alert, approp, no deficits         CXR PA and Lateral:   10/25/2014 :     I personally reviewed images and agree with radiology impression as follows:    COPD/chronic bronchitis. No evidence of superimposed airspace disease and definite evidence of bronchiectasis.       Assessment & Plan:

## 2014-10-25 NOTE — Patient Instructions (Addendum)
Flutter valve as needed.  For nasty mucus > Augmentin 875 mg take one pill twice daily  X 10 days - take at breakfast and supper with large glass of water.  It would help reduce the usual side effects (diarrhea and yeast infections) if you ate cultured yogurt at lunch.   Please see patient coordinator before you leave today  to schedule sinus CT  Please remember to go to the  x-ray department downstairs for your tests - we will call you with the results when they are available.  Please mention this problem to Dr Hyman Bower as he may want to additional evaluation of swallowing at [facility] or wherever he send his patients for this problem which is neurologic, not a primary lung issue  Please schedule a follow up visit in 3 months but call sooner if needed

## 2014-10-27 NOTE — Progress Notes (Signed)
Quick Note:  Spoke with pt and notified of results per Dr. Wert. Pt verbalized understanding and denied any questions.  ______ 

## 2014-11-01 ENCOUNTER — Encounter: Payer: Self-pay | Admitting: Internal Medicine

## 2014-11-01 NOTE — Assessment & Plan Note (Addendum)
-   See CT chest [facility] RLL seg / post segment assoc with bronchiectasis - FOB for 07/13/14 >  Neg cyt/ neg path/ penicillium on fungal/ neg afb  -cxr chronic changes only 10/25/14  I had an extended discussion with the patient/wife reviewing all relevant studies completed to date and  lasting 15 to 20 minutes of a 25 minute visit on the following ongoing concerns:   1) reviewed Dg Es 07/15/14 showing penetration of liquids but then able to cough/ clear airway and concern the former will worsen and eventually the latter iwll no longer be effective and frank aspiration/ recurrent pna will become problematic but this is a neurologic, not primary pulmonary problem  2) f/u by neuro at [facility]   30 add flutter to help clear mucus  4) check sinus ct to be complete  5) when mucus frankly purulent >  Augmentin 875 mg take one pill twice daily  X 10 days  Refillable rx given  See instructions for specific recommendations which were reviewed directly with the patient who was given a copy with highlighter outlining the key components.

## 2014-11-08 ENCOUNTER — Ambulatory Visit (INDEPENDENT_AMBULATORY_CARE_PROVIDER_SITE_OTHER)
Admission: RE | Admit: 2014-11-08 | Discharge: 2014-11-08 | Disposition: A | Discharge status: Home / Self Care | Payer: Medicare Other | Source: Ambulatory Visit | Attending: Internal Medicine | Admitting: Internal Medicine

## 2014-11-08 DIAGNOSIS — R918 Other nonspecific abnormal finding of lung field: Secondary | ICD-10-CM | POA: Diagnosis not present

## 2014-11-09 ENCOUNTER — Telehealth: Payer: Self-pay | Admitting: Internal Medicine

## 2014-11-09 NOTE — Progress Notes (Signed)
Quick Note:  lmtcb ______ 

## 2014-11-09 NOTE — Telephone Encounter (Signed)
   Call patient : Study is unremarkable, no change in recs - be sure he has f/u if not better   I spoke with the pt's spouse and notified of results/recs and she verbalized understanding

## 2014-11-10 NOTE — Progress Notes (Signed)
Quick Note:  See 11/09/14 PN ______

## 2014-11-18 ENCOUNTER — Other Ambulatory Visit: Payer: Self-pay | Admitting: *Deleted

## 2014-11-18 MED ORDER — FLUTTER DEVI: Status: DC

## 2015-03-31 ENCOUNTER — Ambulatory Visit (INDEPENDENT_AMBULATORY_CARE_PROVIDER_SITE_OTHER): Payer: Medicare Other | Admitting: Cardiovascular Disease

## 2015-03-31 ENCOUNTER — Encounter: Payer: Self-pay | Admitting: Cardiovascular Disease

## 2015-03-31 VITALS — BP 112/72 | HR 64 | Ht 68.0 in | Wt 172.0 lb

## 2015-03-31 DIAGNOSIS — Z9114 Patient's other noncompliance with medication regimen: Secondary | ICD-10-CM

## 2015-03-31 DIAGNOSIS — Z955 Presence of coronary angioplasty implant and graft: Secondary | ICD-10-CM | POA: Diagnosis not present

## 2015-03-31 DIAGNOSIS — I2510 Atherosclerotic heart disease of native coronary artery without angina pectoris: Secondary | ICD-10-CM | POA: Diagnosis not present

## 2015-03-31 DIAGNOSIS — I6523 Occlusion and stenosis of bilateral carotid arteries: Secondary | ICD-10-CM

## 2015-03-31 DIAGNOSIS — E785 Hyperlipidemia, unspecified: Secondary | ICD-10-CM | POA: Diagnosis not present

## 2015-03-31 DIAGNOSIS — Z8679 Personal history of other diseases of the circulatory system: Secondary | ICD-10-CM

## 2015-03-31 DIAGNOSIS — I10 Essential (primary) hypertension: Secondary | ICD-10-CM | POA: Diagnosis not present

## 2015-03-31 NOTE — Patient Instructions (Signed)
Continue all current medications. Your physician wants you to follow up in:  1 year.  You will receive a reminder letter in the mail one-two months in advance.  If you don't receive a letter, please call our office to schedule the follow up appointment   

## 2015-03-31 NOTE — Progress Notes (Signed)
Patient ID: [name] [name], male   DOB: 10/15/1940, 74 y.o.   MRN: [id number]      SUBJECTIVE: The patient presents for routine follow. He reportedly has a history of CAD with LAD and left circumflex coronary artery stents in 2006, as well as hypertension, hyperlipidemia, SVT s/p ablation, and bilateral carotid artery occlusion. He has COPD and bronchiectasis and is followed by pulmonary.   Nuclear stress testing on 09/27/14 was normal with no evidence of ischemia or scar and normal LV systolic function. Echocardiogram demonstrated normal left ventricular systolic function, EF 14-48%, with mild to moderate aortic regurgitation.  He has chronic exertional dyspnea which is unchanged.   No chest pain. Sprained right knee while walking his dog.  Soc: He and his wife, "[name] [name]" ([name] [name]), live in [place]. 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    Review of Systems: As per "subjective", otherwise negative.  No Known Allergies  Current Outpatient Prescriptions  Medication Sig Dispense Refill  . aspirin 81 MG tablet Take 81 mg by mouth every morning.     . donepezil (ARICEPT) 10 MG tablet Take 30 mg by mouth at bedtime.     Marland Kitchen escitalopram (LEXAPRO) 20 MG tablet Take 10 mg by mouth every morning.     Marland Kitchen ibuprofen (ADVIL,MOTRIN) 800 MG tablet Take 1 tablet by mouth as needed.    Marland Kitchen LORazepam (ATIVAN) 1 MG tablet Take 1 mg by mouth 2 (two) times daily. 1 tablet in am and 2 tablets at hs    . Lysine 500 MG CAPS Take 500 mg by mouth daily.     . megestrol (MEGACE) 40 MG tablet Take 40 mg by mouth 2 (two) times daily.    . montelukast (SINGULAIR) 10 MG tablet Take 10 mg by mouth every morning.    . niacin 500 MG CR capsule Take 500 mg by mouth at bedtime.     Marland Kitchen NITROSTAT 0.4 MG SL tablet Place 0.4 mg under the tongue as needed.    Marland Kitchen oxybutynin (DITROPAN) 5 MG tablet Take 5 mg by mouth daily.    . pantoprazole (PROTONIX) 40 MG tablet Take 40 mg by mouth daily.     .  QUEtiapine (SEROQUEL) 200 MG tablet Take 200 mg by mouth at bedtime.    Marland Kitchen Respiratory Therapy Supplies (FLUTTER) DEVI Use as directed. 1 each 0  . Tamsulosin HCl (FLOMAX) 0.4 MG CAPS Take 0.4 mg by mouth daily.     . traMADol (ULTRAM) 50 MG tablet Take 1 tablet by mouth as needed.    . triamcinolone (KENALOG) 0.1 % paste Use as directed 1 application in the mouth or throat as needed.    . vitamin B-12 (CYANOCOBALAMIN) 1000 MCG tablet Take 1,000 mcg by mouth daily.     No current facility-administered medications for this visit.    Past Medical History  Diagnosis Date  . Coronary atherosclerosis of native coronary artery     DES to LAD and circumflex 2006  . Essential hypertension, benign   . Hyperlipidemia   . SVT (supraventricular tachycardia)     Status post RFA  . Sleep apnea   . Anxiety   . Glucose intolerance (impaired glucose tolerance)   . Osteoarthritis   . Carotid stenosis   . Long-term memory loss     Aricept and Seroquel  . GERD (gastroesophageal reflux disease)   . Hemorrhoids   . History of colon polyps   . Depression   .  Insomnia   . COPD (chronic obstructive pulmonary disease)   . Skin cancer   . Nosebleed     09/15/12 - Dr Adriana Reams, ENT   . Dementia     Dr Bjorn Loser - Neurology    Past Surgical History  Procedure Laterality Date  . Cervical spine surgery  1978    x 2  . Hemorrhoid surgery  1970  . Tonsillectomy and adenoidectomy    . 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 Circumcision      Age 73  . Back surgery    . Cataract surgery      Bilateral  . Colonoscopy    . Thumb surgery      Bilateral  . Polyp removed from nose    . Knee arthroscopy  04/03/2012    Procedure: ARTHROSCOPY KNEE;  Surgeon: Marin Shutter, MD;  Location: [facility];  Service: Orthopedics;  Laterality: Right;  Right Knee Arthroscopy with Debridement   . Eye surgery    . Total knee arthroplasty Right 10/07/2012    Procedure: TOTAL KNEE ARTHROPLASTY;  Surgeon: Mauri Pole, MD;  Location: [facility];  Service: Orthopedics;  Laterality: Right;  . Video bronchoscopy Bilateral 07/13/2014    Procedure: VIDEO BRONCHOSCOPY WITH FLUORO;  Surgeon: Tanda Rockers, MD;  Location: [facility];  Service: Cardiopulmonary;  Laterality: Bilateral;    Social History   Social History  . Marital Status: Married    Spouse Name: N/A  . Number of Children: N/A  . Years of Education: N/A   Occupational History  . Retired    Social History Main Topics  . Smoking status: Former Smoker -- 1.50 packs/day for 35 years    Types: Cigarettes    Quit date: 08/07/1999  . Smokeless tobacco: Never Used  . Alcohol Use: No  . Drug Use: No  . Sexual Activity: Yes   Other Topics Concern  . Not on file   Social History Narrative     Filed Vitals:   03/31/15 1316  BP: 112/72  Pulse: 64  Height: 5\' 8"  (1.727 m)  Weight: 172 lb (78.019 kg)  SpO2: 96%    PHYSICAL EXAM General: NAD HEENT: Normal. Neck: No JVD, no thyromegaly. Lungs: Clear to auscultation bilaterally with normal respiratory effort. CV: Nondisplaced PMI.  Regular rate and rhythm, normal S1/S2, no S3/S4, no murmur. No pretibial or periankle edema.  No carotid bruit.  Normal pedal pulses.  Abdomen: Soft, nontender, no hepatosplenomegaly, no distention.  Neurologic: Alert and oriented x 3.  Psych: Normal affect. Skin: Normal. Musculoskeletal: Normal range of motion, no gross deformities. Extremities: No clubbing or cyanosis.   ECG: Most recent ECG reviewed.      ASSESSMENT AND PLAN: 1. CAD with prior stents: Stable ischemic heart disease. Exercise Cardiolite stress test and echocardiogram results noted above. Continue aspirin. Decided with his wife and reportedly his PCP to stop taking Lipitor. Informed him regarding pleiotropic effects of statin therapy, but he prefers to not take it.  2. Essential HTN: Well controlled. No changes.  3. Hyperlipidemia: Results from 03/09/15 showed total cholesterol  120, LDL 55, HDL 55, triglycerides 51. Decided with his wife and reportedly his PCP to stop taking Lipitor.  4. SVT s/p ablation: Symptomatically stable.  5. Carotid artery occlusion: I cannot locate recent Dopplers and am uncertain about this reported history.  Dispo: f/u 1 year.  Kate Sable, M.D., F.A.C.C.

## 2015-04-06 ENCOUNTER — Encounter (HOSPITAL_COMMUNITY): Payer: Self-pay

## 2015-05-19 ENCOUNTER — Ambulatory Visit (INDEPENDENT_AMBULATORY_CARE_PROVIDER_SITE_OTHER): Payer: Medicare Other | Admitting: *Deleted

## 2015-05-19 DIAGNOSIS — Z23 Encounter for immunization: Secondary | ICD-10-CM | POA: Diagnosis not present

## 2015-08-23 ENCOUNTER — Ambulatory Visit (INDEPENDENT_AMBULATORY_CARE_PROVIDER_SITE_OTHER): Payer: Medicare Other | Admitting: Cardiovascular Disease

## 2015-08-23 ENCOUNTER — Encounter: Payer: Self-pay | Admitting: Cardiovascular Disease

## 2015-08-23 ENCOUNTER — Other Ambulatory Visit: Payer: Self-pay | Admitting: *Deleted

## 2015-08-23 VITALS — BP 116/74 | HR 66 | Ht 68.0 in | Wt 174.0 lb

## 2015-08-23 DIAGNOSIS — I209 Angina pectoris, unspecified: Secondary | ICD-10-CM

## 2015-08-23 DIAGNOSIS — Z8679 Personal history of other diseases of the circulatory system: Secondary | ICD-10-CM

## 2015-08-23 DIAGNOSIS — I6523 Occlusion and stenosis of bilateral carotid arteries: Secondary | ICD-10-CM

## 2015-08-23 DIAGNOSIS — I25118 Atherosclerotic heart disease of native coronary artery with other forms of angina pectoris: Secondary | ICD-10-CM

## 2015-08-23 DIAGNOSIS — Z955 Presence of coronary angioplasty implant and graft: Secondary | ICD-10-CM | POA: Diagnosis not present

## 2015-08-23 DIAGNOSIS — I2510 Atherosclerotic heart disease of native coronary artery without angina pectoris: Secondary | ICD-10-CM

## 2015-08-23 DIAGNOSIS — I10 Essential (primary) hypertension: Secondary | ICD-10-CM

## 2015-08-23 DIAGNOSIS — E785 Hyperlipidemia, unspecified: Secondary | ICD-10-CM

## 2015-08-23 MED ORDER — RANOLAZINE ER 500 MG PO TB12: 500.0000 mg | ORAL_TABLET | Freq: Two times a day (BID) | ORAL | Status: DC

## 2015-08-23 NOTE — Progress Notes (Signed)
Patient ID: [name] [name], male   DOB: [birth year], 75 y.o.   MRN: [id number]      SUBJECTIVE: The patient presents for routine follow. He reportedly has a history of CAD with LAD and left circumflex coronary artery stents in 2006, as well as hypertension, hyperlipidemia, SVT s/p ablation, and bilateral carotid artery occlusion. He has COPD and bronchiectasis and is followed by pulmonary.   Nuclear stress testing on 09/27/14 was normal with no evidence of ischemia or scar and normal LV systolic function. Echocardiogram demonstrated normal left ventricular systolic function, EF 123456, with mild to moderate aortic regurgitation.  Over the past 5 weeks, he has been experiencing episodic chest pain at rest lasting up to 15 minutes. It has been alleviated with one or two nitroglycerin tablets. He does have more short of breath when it occurs. He describes it as a "mediocre pain" and points to the center of his chest. There is no radiation to the back or jaw. He develops headaches after taking SL nitroglycerin.   ECG performed in the office today demonstrates sinus rhythm with a left anterior fascicular block.  Soc: He and his wife, [name], live in [place]. 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   Review of Systems: As per "subjective", otherwise negative.  No Known Allergies  Current Outpatient Prescriptions  Medication Sig Dispense Refill  . aspirin 81 MG tablet Take 81 mg by mouth every morning.     . donepezil (ARICEPT) 23 MG TABS tablet Take 1 tablet by mouth daily.    Marland Kitchen escitalopram (LEXAPRO) 20 MG tablet Take 10 mg by mouth every morning.     Marland Kitchen ibuprofen (ADVIL,MOTRIN) 800 MG tablet Take 1 tablet by mouth as needed.    Marland Kitchen LORazepam (ATIVAN) 1 MG tablet Take 1 mg by mouth 2 (two) times daily. 1 tablet in am and 2 tablets at hs    . meloxicam (MOBIC) 7.5 MG tablet Take 1 tablet by mouth 2 (two) times daily as needed. For headaches    . montelukast  (SINGULAIR) 10 MG tablet Take 10 mg by mouth every morning.    . niacin 500 MG CR capsule Take 500 mg by mouth at bedtime.     Marland Kitchen NITROSTAT 0.4 MG SL tablet Place 0.4 mg under the tongue as needed.    Marland Kitchen oxybutynin (DITROPAN) 5 MG tablet Take 5 mg by mouth daily.    . pantoprazole (PROTONIX) 40 MG tablet Take 40 mg by mouth daily.     . QUEtiapine (SEROQUEL) 200 MG tablet Take 200 mg by mouth at bedtime.    Marland Kitchen Respiratory Therapy Supplies (FLUTTER) DEVI Use as directed. 1 each 0  . Tamsulosin HCl (FLOMAX) 0.4 MG CAPS Take 0.4 mg by mouth daily.     . traMADol (ULTRAM) 50 MG tablet Take 1 tablet by mouth as needed.    . triamcinolone (KENALOG) 0.1 % paste Use as directed 1 application in the mouth or throat as needed.    . vitamin B-12 (CYANOCOBALAMIN) 1000 MCG tablet Take 1,000 mcg by mouth daily.     No current facility-administered medications for this visit.    Past Medical History  Diagnosis Date  . Coronary atherosclerosis of native coronary artery     DES to LAD and circumflex 2006  . Essential hypertension, benign   . Hyperlipidemia   . SVT (supraventricular tachycardia) (HCC)     Status post RFA  . Sleep apnea   .  Anxiety   . Glucose intolerance (impaired glucose tolerance)   . Osteoarthritis   . Carotid stenosis   . Long-term memory loss     Aricept and Seroquel  . GERD (gastroesophageal reflux disease)   . Hemorrhoids   . History of colon polyps   . Depression   . Insomnia   . COPD (chronic obstructive pulmonary disease) ([place])   . Skin cancer   . Nosebleed     09/15/12 - Dr Adriana Reams, ENT   . Dementia     Dr Bjorn Loser - Neurology    Past Surgical History  Procedure Laterality Date  . Cervical spine surgery  1978    x 2  . Hemorrhoid surgery  1970  . Tonsillectomy and adenoidectomy    . 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 Circumcision      Age 40  . Back surgery    . Cataract surgery      Bilateral  . Colonoscopy    . Thumb surgery       Bilateral  . Polyp removed from nose    . Knee arthroscopy  04/03/2012    Procedure: ARTHROSCOPY KNEE;  Surgeon: Marin Shutter, MD;  Location: [facility];  Service: Orthopedics;  Laterality: Right;  Right Knee Arthroscopy with Debridement   . Eye surgery    . Total knee arthroplasty Right 10/07/2012    Procedure: TOTAL KNEE ARTHROPLASTY;  Surgeon: Mauri Pole, MD;  Location[facility];  Service: Orthopedics;  Laterality: Right;  . Video bronchoscopy Bilateral 07/13/2014    Procedure: VIDEO BRONCHOSCOPY WITH FLUORO;  Surgeon: Tanda Rockers, MD;  [facility];  Service: Cardiopulmonary;  Laterality: Bilateral;    Social History   Social History  . Marital Status: Married    Spouse Name: N/A  . Number of Children: N/A  . Years of Education: N/A   Occupational History  . Retired    Social History Main Topics  . Smoking status: Former Smoker -- 1.50 packs/day for 35 years    Types: Cigarettes    Start date: 03/07/1959    Quit date: 08/07/1999  . Smokeless tobacco: Never Used  . Alcohol Use: No  . Drug Use: No  . Sexual Activity: Yes   Other Topics Concern  . Not on file   Social History Narrative     Filed Vitals:   08/23/15 0911  BP: 116/74  Pulse: 66  Height: 5\' 8"  (1.727 m)  Weight: 174 lb (78.926 kg)  SpO2: 96%    PHYSICAL EXAM General: NAD HEENT: Normal. Neck: No JVD, no thyromegaly. Lungs: Diffuse bilateral rhonchi. CV: Nondisplaced PMI.  Regular rate and rhythm, normal S1/S2, no S3/S4, no murmur. No pretibial or periankle edema.   Abdomen: Soft, nontender, no distention.  Neurologic: Alert and oriented x 3.  Psych: Flat affect. Skin: Normal. Musculoskeletal: No gross deformities. Extremities: No clubbing or cyanosis.   ECG: Most recent ECG reviewed.      ASSESSMENT AND PLAN: 1. Chest pain in context of CAD with prior stents: More frequent chest pains in past 5 weeks indicative of angina. Develops headaches after SL nitroglycerin. Will try  Ranexa 500 mg bid. If no relief with this dose, will increase to 1000 mg bid. Exercise Cardiolite stress test and echocardiogram results noted above from early 2016, no ischemia. Continue aspirin. Decided with his wife and reportedly his PCP to stop taking Lipitor last year. Previously informed him regarding pleiotropic effects  of statin therapy, but he preferred to not take it.  2. Essential HTN: Well controlled. No changes.  3. Hyperlipidemia: Results from 03/09/15 showed total cholesterol 120, LDL 55, HDL 55, triglycerides 51. Decided with his wife and reportedly his PCP to stop taking Lipitor last year.  4. SVT s/p ablation: Symptomatically stable.  5. Carotid artery occlusion: I have not located recent Dopplers and am uncertain about this reported history.  Dispo: f/u 3 months.  Kate Sable, M.D., F.A.C.C.

## 2015-08-23 NOTE — Patient Instructions (Signed)
Your physician recommends that you schedule a follow-up appointment in: [place]   Your physician has recommended you make the following change in your medication:   START RANEXA 500 MG TWICE DAILY  Thank you for choosing [facility]!!

## 2015-09-15 IMAGING — CR DG CHEST 2V
2 series · 2 of 2 positions shown · non-contrast
Comparison: Chest radiograph of 09/11/2014.  CT of 06/24/2014

CLINICAL DATA: Followup of bronchiectasis. Pulmonary infiltrates.
Ex-smoker.

EXAM:
CHEST  2 VIEW

[view not recorded (1 of 2)]
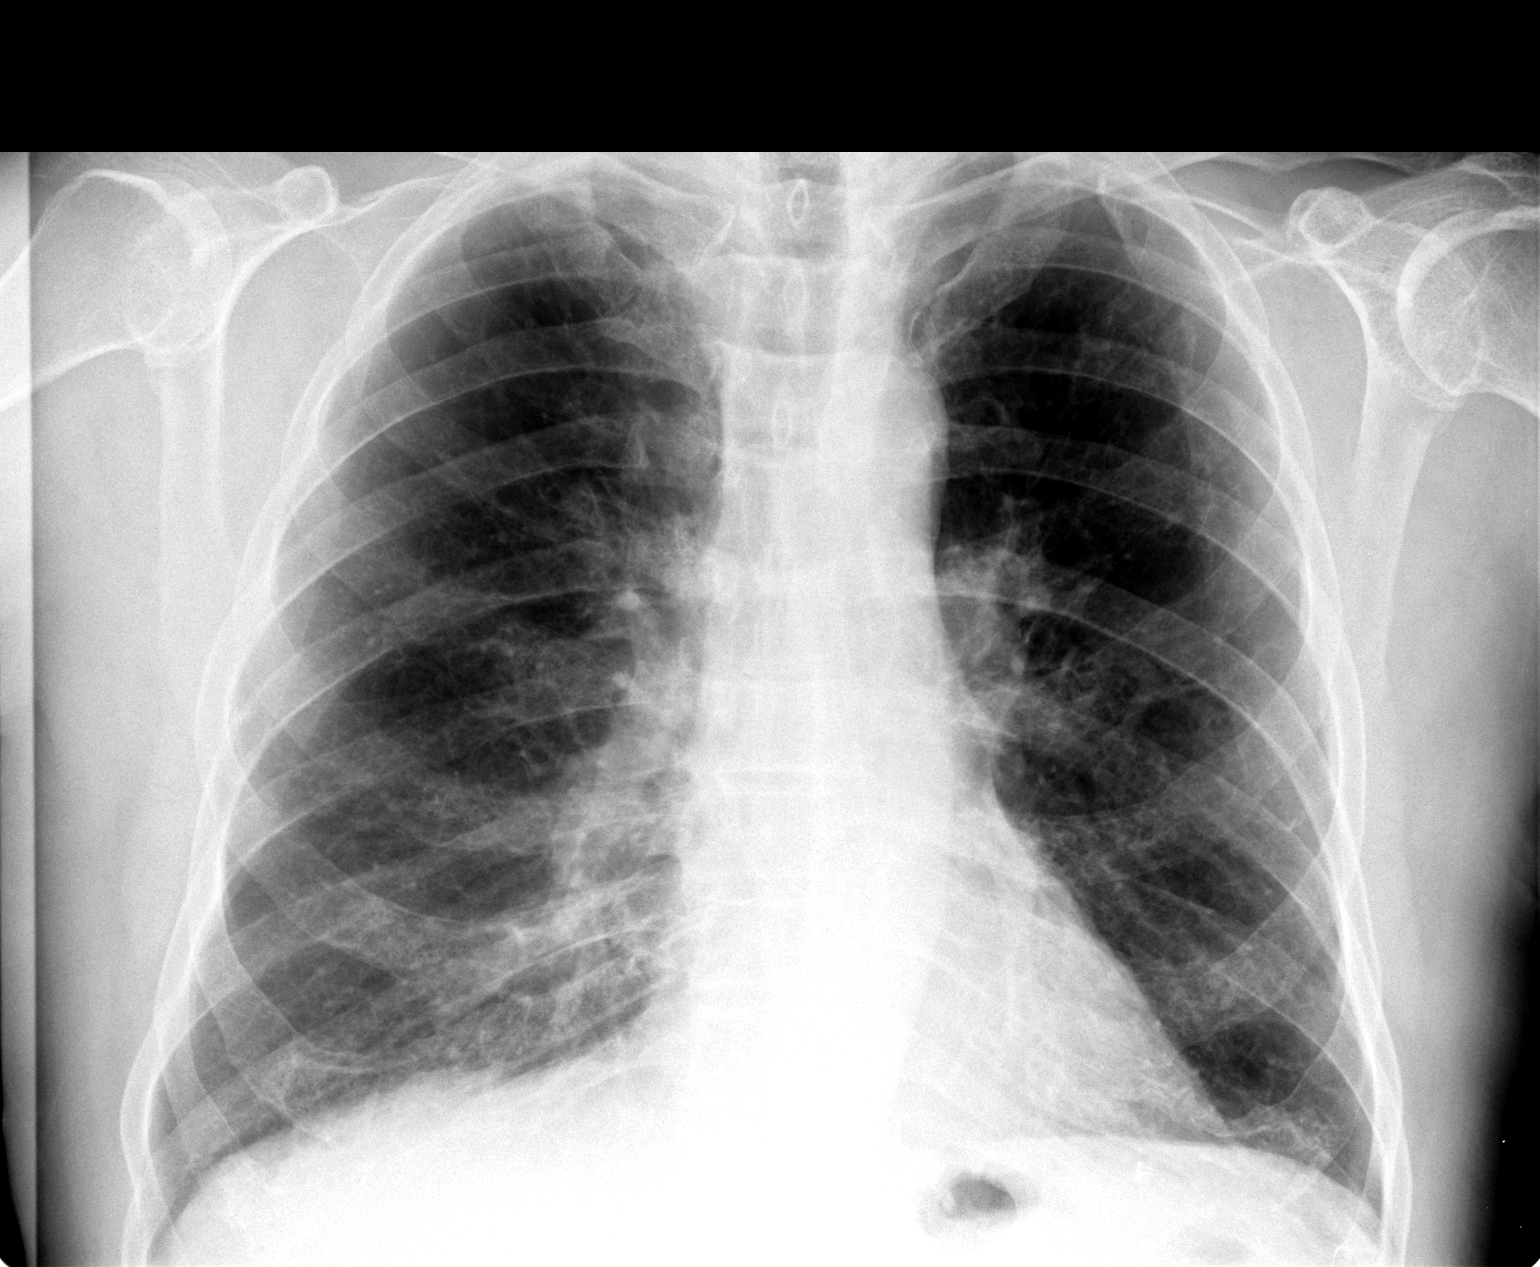

[view not recorded (2 of 2)]
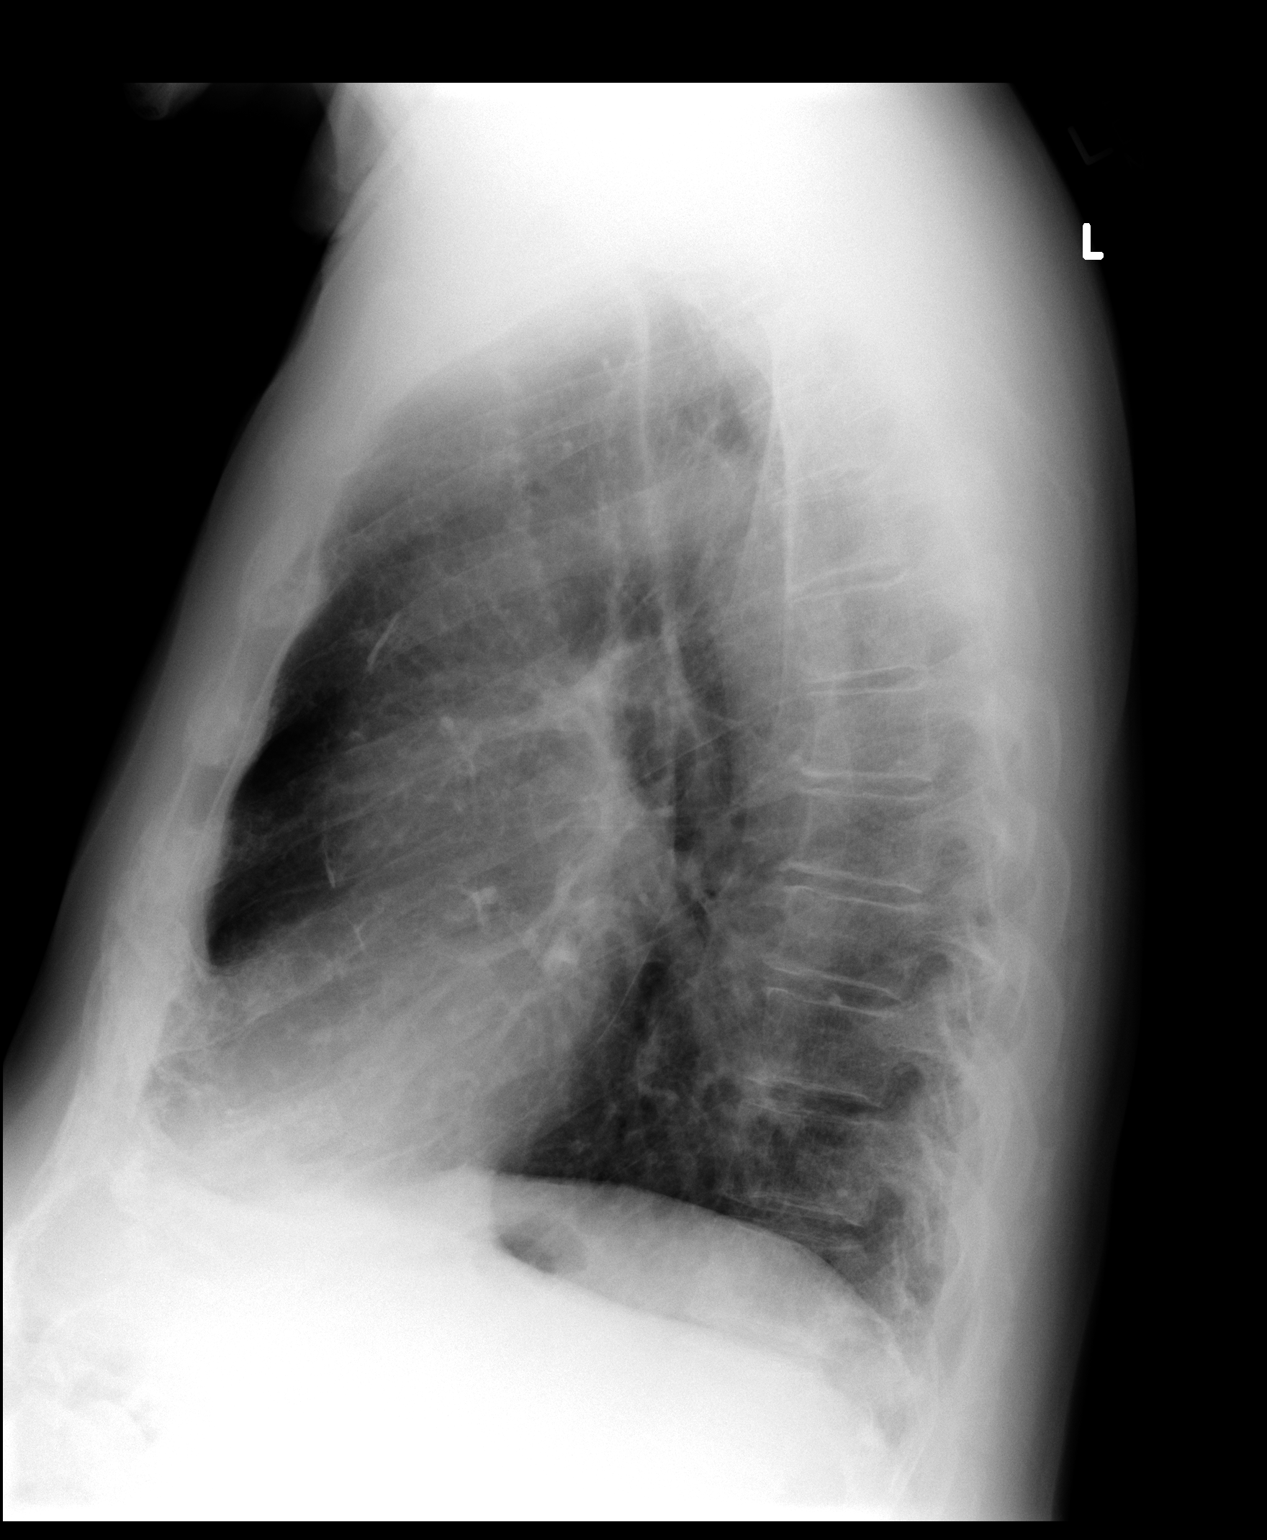

[2 of 2 positions shown; findings below may reference images not displayed]

FINDINGS: Mild hyperinflation. Midline trachea. Normal heart size and
mediastinal contours. No pleural effusion or pneumothorax. Lower
lobe predominant interstitial thickening is similar. Relative
lucencies at the apices are also similar and consistent with
emphysema. No lobar consolidation.
IMPRESSION: COPD/chronic bronchitis. No evidence of superimposed airspace
disease and definite evidence of bronchiectasis.

## 2015-11-22 ENCOUNTER — Encounter: Payer: Self-pay | Admitting: Cardiovascular Disease

## 2015-11-22 ENCOUNTER — Ambulatory Visit (INDEPENDENT_AMBULATORY_CARE_PROVIDER_SITE_OTHER): Payer: Medicare Other | Admitting: Cardiovascular Disease

## 2015-11-22 VITALS — BP 112/73 | HR 67 | Ht 68.0 in | Wt 174.0 lb

## 2015-11-22 DIAGNOSIS — I6523 Occlusion and stenosis of bilateral carotid arteries: Secondary | ICD-10-CM | POA: Diagnosis not present

## 2015-11-22 DIAGNOSIS — I25118 Atherosclerotic heart disease of native coronary artery with other forms of angina pectoris: Secondary | ICD-10-CM | POA: Diagnosis not present

## 2015-11-22 DIAGNOSIS — E785 Hyperlipidemia, unspecified: Secondary | ICD-10-CM

## 2015-11-22 DIAGNOSIS — I209 Angina pectoris, unspecified: Secondary | ICD-10-CM | POA: Diagnosis not present

## 2015-11-22 DIAGNOSIS — I10 Essential (primary) hypertension: Secondary | ICD-10-CM | POA: Diagnosis not present

## 2015-11-22 DIAGNOSIS — Z955 Presence of coronary angioplasty implant and graft: Secondary | ICD-10-CM

## 2015-11-22 DIAGNOSIS — Z8679 Personal history of other diseases of the circulatory system: Secondary | ICD-10-CM

## 2015-11-22 NOTE — Patient Instructions (Signed)
Continue all current medications. Your physician wants you to follow up in: 6 months.  You will receive a reminder letter in the mail one-two months in advance.  If you don't receive a letter, please call our office to schedule the follow up appointment   

## 2015-11-22 NOTE — Progress Notes (Signed)
Patient ID: [name] [name], male   DOB: [birth year], 75 y.o.   MRN: [id number]      SUBJECTIVE: The patient presents for follow up of chest pain/angina. I started Ranexa in 08/2015. Since then, he has not had to use SL nitro. He feels well and denies chest pain and shortness of breath.  In summary, he reportedly has a history of CAD with LAD and left circumflex coronary artery stents in 2006, as well as hypertension, hyperlipidemia, SVT s/p ablation, and bilateral carotid artery occlusion. He has COPD and bronchiectasis and is followed by pulmonary.   Nuclear stress testing on 09/27/14 was normal with no evidence of ischemia or scar and normal LV systolic function. Echocardiogram demonstrated normal left ventricular systolic function, EF 123456, with mild to moderate aortic regurgitation.   Soc: He and his wife, "[name]" ([name]), live in [place]. She is also my patient. 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   Review of Systems: As per "subjective", otherwise negative.  No Known Allergies  Current Outpatient Prescriptions  Medication Sig Dispense Refill  . aspirin 81 MG tablet Take 81 mg by mouth every morning.     . cholecalciferol (VITAMIN D) 1000 units tablet Take 1,000 Units by mouth daily.    Marland Kitchen donepezil (ARICEPT) 23 MG TABS tablet Take 1 tablet by mouth daily.    Marland Kitchen escitalopram (LEXAPRO) 20 MG tablet Take 20 mg by mouth every morning.     . Ferrous Sulfate (IRON) 325 (65 Fe) MG TABS Take 1 tablet by mouth daily.    Marland Kitchen ibuprofen (ADVIL,MOTRIN) 800 MG tablet Take 1 tablet by mouth as needed.    Marland Kitchen LORazepam (ATIVAN) 1 MG tablet Take 2 mg by mouth at bedtime.     . Melatonin 5 MG CAPS Take 2 capsules by mouth at bedtime.    . meloxicam (MOBIC) 7.5 MG tablet Take 1 tablet by mouth 2 (two) times daily as needed. For headaches    . montelukast (SINGULAIR) 10 MG tablet Take 10 mg by mouth every morning.    . niacin 500 MG CR capsule Take 500 mg by mouth at  bedtime.     Marland Kitchen NITROSTAT 0.4 MG SL tablet Place 0.4 mg under the tongue as needed.    . Omega-3 Fatty Acids (FISH OIL) 1200 MG CAPS Take 1 capsule by mouth 2 (two) times daily.    Marland Kitchen oxybutynin (DITROPAN) 5 MG tablet Take 5 mg by mouth daily.    . pantoprazole (PROTONIX) 40 MG tablet Take 40 mg by mouth daily.     . QUEtiapine (SEROQUEL) 200 MG tablet Take 200 mg by mouth at bedtime.    . ranolazine (RANEXA) 500 MG 12 hr tablet Take 1 tablet (500 mg total) by mouth 2 (two) times daily. 28 tablet 0  . Respiratory Therapy Supplies (FLUTTER) DEVI Use as directed. 1 each 0  . Tamsulosin HCl (FLOMAX) 0.4 MG CAPS Take 0.4 mg by mouth daily.     . traMADol (ULTRAM) 50 MG tablet Take 1 tablet by mouth as needed.     No current facility-administered medications for this visit.    Past Medical History  Diagnosis Date  . Coronary atherosclerosis of native coronary artery     DES to LAD and circumflex 2006  . Essential hypertension, benign   . Hyperlipidemia   . SVT (supraventricular tachycardia) (HCC)     Status post RFA  . Sleep apnea   . Anxiety   .  Glucose intolerance (impaired glucose tolerance)   . Osteoarthritis   . Carotid stenosis   . Long-term memory loss     Aricept and Seroquel  . GERD (gastroesophageal reflux disease)   . Hemorrhoids   . History of colon polyps   . Depression   . Insomnia   . COPD (chronic obstructive pulmonary disease) ([place])   . Skin cancer   . Nosebleed     09/15/12 - Dr Adriana Reams, ENT   . Dementia     Dr Bjorn Loser - Neurology    Past Surgical History  Procedure Laterality Date  . Cervical spine surgery  1978    x 2  . Hemorrhoid surgery  1970  . Tonsillectomy and adenoidectomy    . 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 Circumcision      Age 82  . Back surgery    . Cataract surgery      Bilateral  . Colonoscopy    . Thumb surgery      Bilateral  . Polyp removed from nose    . Knee arthroscopy  04/03/2012    Procedure:  ARTHROSCOPY KNEE;  Surgeon: Marin Shutter, MD;  Location: [facility];  Service: Orthopedics;  Laterality: Right;  Right Knee Arthroscopy with Debridement   . Eye surgery    . Total knee arthroplasty Right 10/07/2012    Procedure: TOTAL KNEE ARTHROPLASTY;  Surgeon: Mauri Pole, MD;  Location[facility];  Service: Orthopedics;  Laterality: Right;  . Video bronchoscopy Bilateral 07/13/2014    Procedure: VIDEO BRONCHOSCOPY WITH FLUORO;  Surgeon: Tanda Rockers, MD;  [facility];  Service: Cardiopulmonary;  Laterality: Bilateral;    Social History   Social History  . Marital Status: Married    Spouse Name: N/A  . Number of Children: N/A  . Years of Education: N/A   Occupational History  . Retired    Social History Main Topics  . Smoking status: Former Smoker -- 1.50 packs/day for 35 years    Types: Cigarettes    Start date: 03/07/1959    Quit date: 08/07/1999  . Smokeless tobacco: Never Used  . Alcohol Use: No  . Drug Use: No  . Sexual Activity: Yes   Other Topics Concern  . Not on file   Social History Narrative     Filed Vitals:   11/22/15 0946  BP: 112/73  Pulse: 67  Height: 5\' 8"  (1.727 m)  Weight: 174 lb (78.926 kg)    PHYSICAL EXAM General: NAD HEENT: Normal. Neck: No JVD, no thyromegaly. Lungs: clear. CV: Nondisplaced PMI. Regular rate and rhythm, normal S1/S2, no S3/S4, no murmur. No pretibial or periankle edema.  Abdomen: Soft, nontender, no distention.  Neurologic: Alert and oriented x 3.  Psych: Flat affect. Skin: Normal. Musculoskeletal: No gross deformities. Extremities: No clubbing or cyanosis.   ECG: Most recent ECG reviewed.      ASSESSMENT AND PLAN: 1. Chest pain in context of CAD with prior stents: Alleviated with Ranexa 500 mg bid. No changes. Exercise Cardiolite stress test and echocardiogram results noted above from early 2016, no ischemia. Continue aspirin. Decided with his wife and reportedly his PCP to stop taking Lipitor  last year. Previously informed him regarding pleiotropic effects of statin therapy, but he preferred to not take it.  2. Essential HTN: Well controlled. No changes.  3. Hyperlipidemia: Results from 03/09/15 showed total cholesterol 120, LDL 55, HDL 55, triglycerides 51. Decided with his  wife and reportedly his PCP to stop taking Lipitor last year.  4. SVT s/p ablation: Symptomatically stable.  5. Carotid artery occlusion: I have not located recent Dopplers and am uncertain about this reported history.  Dispo: f/u 6 months.   Kate Sable, M.D., F.A.C.C.

## 2016-01-31 ENCOUNTER — Ambulatory Visit (INDEPENDENT_AMBULATORY_CARE_PROVIDER_SITE_OTHER)
Admission: RE | Admit: 2016-01-31 | Discharge: 2016-01-31 | Disposition: A | Discharge status: Home / Self Care | Payer: Medicare Other | Source: Ambulatory Visit | Attending: Internal Medicine | Admitting: Internal Medicine

## 2016-01-31 ENCOUNTER — Ambulatory Visit (INDEPENDENT_AMBULATORY_CARE_PROVIDER_SITE_OTHER): Payer: Medicare Other | Admitting: Internal Medicine

## 2016-01-31 ENCOUNTER — Other Ambulatory Visit (INDEPENDENT_AMBULATORY_CARE_PROVIDER_SITE_OTHER): Payer: Medicare Other

## 2016-01-31 ENCOUNTER — Encounter: Payer: Self-pay | Admitting: Internal Medicine

## 2016-01-31 VITALS — BP 112/76 | HR 72 | Ht 68.0 in | Wt 176.4 lb

## 2016-01-31 DIAGNOSIS — R0600 Dyspnea, unspecified: Secondary | ICD-10-CM | POA: Diagnosis not present

## 2016-01-31 DIAGNOSIS — R0609 Other forms of dyspnea: Secondary | ICD-10-CM

## 2016-01-31 DIAGNOSIS — R918 Other nonspecific abnormal finding of lung field: Secondary | ICD-10-CM | POA: Diagnosis not present

## 2016-01-31 DIAGNOSIS — I6523 Occlusion and stenosis of bilateral carotid arteries: Secondary | ICD-10-CM

## 2016-01-31 LAB — CBC WITH DIFFERENTIAL/PLATELET
BASOS ABS: 0.1 10*3/uL (ref 0.0–0.1)
Basophils Relative: 1.1 % (ref 0.0–3.0)
Eosinophils Absolute: 0.1 10*3/uL (ref 0.0–0.7)
Eosinophils Relative: 2 % (ref 0.0–5.0)
HEMATOCRIT: 44.3 % (ref 39.0–52.0)
HEMOGLOBIN: 14.8 g/dL (ref 13.0–17.0)
LYMPHS ABS: 1.7 10*3/uL (ref 0.7–4.0)
LYMPHS PCT: 33.2 % (ref 12.0–46.0)
MCHC: 33.4 g/dL (ref 30.0–36.0)
MCV: 89.7 fl (ref 78.0–100.0)
MONOS PCT: 14.1 % — AB (ref 3.0–12.0)
Monocytes Absolute: 0.7 10*3/uL (ref 0.1–1.0)
NEUTROS PCT: 49.6 % (ref 43.0–77.0)
Neutro Abs: 2.5 10*3/uL (ref 1.4–7.7)
Platelets: 166 10*3/uL (ref 150.0–400.0)
RBC: 4.94 Mil/uL (ref 4.22–5.81)
RDW: 13.1 % (ref 11.5–15.5)
WBC: 5 10*3/uL (ref 4.0–10.5)

## 2016-01-31 LAB — BASIC METABOLIC PANEL
BUN: 14 mg/dL (ref 6–23)
CO2: 32 mEq/L (ref 19–32)
Calcium: 9.2 mg/dL (ref 8.4–10.5)
Chloride: 103 mEq/L (ref 96–112)
Creatinine, Ser: 1.24 mg/dL (ref 0.40–1.50)
GFR: 60.75 mL/min (ref >60.00)
Glucose, Bld: 70 mg/dL (ref 70–99)
Potassium: 4.4 mEq/L (ref 3.5–5.1)
Sodium: 139 mEq/L (ref 135–145)

## 2016-01-31 LAB — TSH: TSH: 2.26 u[IU]/mL (ref 0.35–4.50)

## 2016-01-31 LAB — BRAIN NATRIURETIC PEPTIDE: Pro B Natriuretic peptide (BNP): 25 pg/mL (ref 0.0–100.0)

## 2016-01-31 NOTE — Patient Instructions (Signed)
No fish oil  Continue Pantoprazole (protonix) 40 mg   Take  30-60 min before first meal of the day and Pepcid (famotidine)  20 mg one @  bedtime until return to office - this is the best way to tell whether stomach acid is contributing to your problem.    GERD (REFLUX)  is an extremely common cause of respiratory symptoms just like yours , many times with no obvious heartburn at all.    It can be treated with medication, but also with lifestyle changes including elevation of the head of your bed (ideally with 6 inch  bed blocks),  Smoking cessation, avoidance of late meals, excessive alcohol, and avoid fatty foods, chocolate, peppermint, colas, red wine, and acidic juices such as orange juice.  NO MINT OR MENTHOL PRODUCTS SO NO COUGH DROPS  USE SUGARLESS CANDY INSTEAD (Jolley ranchers or Stover's or Life Savers) or even ice chips will also do - the key is to swallow to prevent all throat clearing. NO OIL BASED VITAMINS - use powdered substitutes.   Please remember to go to the lab and x-ray department downstairs for your tests - we will call you with the results when they are available.    Please schedule a follow up office visit in 4 weeks, sooner if needed

## 2016-01-31 NOTE — Progress Notes (Signed)
Subjective:    Patient ID: [name] [name], male    DOB: November 04, 1940  MRN: [id number]    Brief patient profile:  78 yowm quit smoking around 2000 no trouble at all except for little coughing until  Winter of 2015 then sob in summer 2015 still did yard work then cxr done > referred 07/06/2014 to pulmonary clinic by Dr Melina Copa  For abn cxr > CT c/w bronchiectasis/ MAI can't r/o tumor.   History of Present Illness  07/06/2014 1st [facility] office visit/ [name] [name]   Chief Complaint  Patient presents with  . Pulmonary Consult    Referred by Dr. Octavio Graves for eval of lung mass. Pt c/o cough for the past 6-12 months- prod with minimal light yellow sputum. He also c/o DOE with walking minimal distances such as from room to room at home. He also c/o poor appetite.   onset of symptoms was indolent, min progressive, assoc with gen cognitive decline per wife and pt not able to directly remember any of his symptoms and tends to minimize them.  Some difficulty swallowing but no frank asp, no pleuritic cp or hemoptysis rec FOB >  - FOB for 07/13/14 >  Neg cyt/ neg path/ penicillium sp on fungal> complicated by R ptx requiring 24 h Chest tube, resolved     07/26/2014 post hosp f/u ov/[name] [name] re: RLL as dz assoc with bronchiectasis  Chief Complaint  Patient presents with  . HFU    pt c/o sob. He was discharged from hospital 2 weeks ago. He denies chest tightness, cough and wheezing.  walkng dog  x 20 min once daily slow pace, no hills, no sob rec Prevnar today is the last pneumonia shot you'll ever need Bronchiectasis =   you have scarring of your bronchial tubes    10/25/2014 f/u ov/[name] [name] re: bronchiectasis  Chief Complaint  Patient presents with  . Follow-up  Not limited by breathing from desired activities  /intermittently problems with swallowing/ coughing but not frank aspiration  rec Flutter valve as needed. For nasty mucus > Augmentin 875 mg take one pill twice daily  X 10 days - take at  breakfast and supper with large glass of water.  It would help reduce the usual side effects (diarrhea and yeast infections) if you ate cultured yogurt at lunch.  Please see patient coordinator before you leave today  to schedule sinus CT Please remember to go to the  x-ray department downstairs for your tests - we will call you with the results when they are available. Please mention this problem to Dr Hyman Bower as he may want to additional evaluation of swallowing at [facility] or wherever he send his patients for this problem which is neurologic, not a primary lung issue Please schedule a follow up visit in 3 months but call sooner if needed > did not return   01/31/2016 acute extended ov/[name] [name] re: no pulmonary meds/ on fish oil / singulair /ppi  Chief Complaint  Patient presents with  . Acute Visit    Pt c/o increased SOB for the past 2 months. He states that he is SOB all of the time, but it is worse with exertion.   hx very challenging and significant discrepancy in hx  husband/wife/nursing/me. Said no sob at rest or hs and no cough but on exam coughed immediately on insp "happens all the time"   Sob has been progressive since onset 1-2 m prior to [place] with abd bloating  Cards eval 11/22/15 reviewed and denied sob at that point and "checked out fine"    No obvious day to day or daytime variabilty or assoc excess/ purulent sputum or mucus plugs   or cp or chest tightness, subjective wheeze overt sinus or hb symptoms. No unusual exp hx or h/o childhood pna/ asthma or knowledge of premature birth.  Sleeping ok without nocturnal  or early am exacerbation  of respiratory  c/o's or need for noct saba. Also denies any obvious fluctuation of symptoms with weather or environmental changes or other aggravating or alleviating factors except as outlined above   Current Medications, Allergies, Complete Past Medical History, Past Surgical History, Family History, and Social History were  reviewed in [employer] record.  ROS  The following are not active complaints unless bolded sore throat, dysphagia, dental problems, itching, sneezing,  nasal congestion or excess/ purulent secretions, ear ache,   fever, chills, sweats, unintended wt loss, pleuritic or exertional cp, hemoptysis,  orthopnea pnd or leg swelling, presyncope, palpitations, heartburn, abdominal pain, anorexia, nausea, vomiting, diarrhea  or change in bowel or urinary habits, change in stools or urine, dysuria,hematuria,  rash, arthralgias, visual complaints, headache, numbness weakness or ataxia or problems with walking or coordination,  change in mood/affect or memory.                   Objective:   Physical Exam  amb wm nad    07/26/2014      163 >   10/25/2014   175 > 01/31/2016  176     07/06/14 162 lb (73.483 kg)  03/16/13 164 lb 12 oz (74.73 kg)  10/07/12 168 lb (76.204 kg)    Vital signs reviewed    HEENT: nl dentition, turbinates, and orophanx. Nl external ear canals without cough reflex   NECK :  without JVD/Nodes/TM/ nl carotid upstrokes bilaterally   LUNGS: no acc muscle use, min insp and exp rhonchi/ cough on insp    CV:  RRR  no s3 or murmur or increase in P2, no edema   ABD:  soft and nontender with nl excursion in the supine position. No bruits or organomegaly, bowel sounds nl  MS:  warm without deformities, calf tenderness, cyanosis or clubbing  SKIN: warm and dry without lesions    NEURO:  alert, approp, no deficits      CXR PA and Lateral:   01/31/2016 :    I personally reviewed images and agree with radiology impression as follows:    1. COPD with mild interstitial prominence slightly more conspicuous than on the study of 3 months ago. This may reflect pneumonitis or mild interstitial edema. There is no cardiomegaly or pulmonary vascular congestion. 2. Aortic atherosclerosis    Labs ordered/ reviewed:    Chemistry      Component Value  Date/Time   NA 139 01/31/2016 1411   K 4.4 01/31/2016 1411   CL 103 01/31/2016 1411   CO2 32 01/31/2016 1411   BUN 14 01/31/2016 1411   CREATININE 1.24 01/31/2016 1411      Component Value Date/Time   CALCIUM 9.2 01/31/2016 1411   ALKPHOS 66 07/13/2014 1300   AST 13 07/13/2014 1300   ALT 11 07/13/2014 1300   BILITOT 0.3 07/13/2014 1300        Lab Results  Component Value Date   WBC 5.0 01/31/2016   HGB 14.8 01/31/2016   HCT 44.3 01/31/2016   MCV 89.7 01/31/2016   PLT 166.0  01/31/2016         Lab Results  Component Value Date   TSH 2.26 01/31/2016     Lab Results  Component Value Date   PROBNP 25.0 01/31/2016                Assessment & Plan:

## 2016-02-01 ENCOUNTER — Encounter: Payer: Self-pay | Admitting: Internal Medicine

## 2016-02-01 NOTE — Assessment & Plan Note (Addendum)
-   See CT chest [facility] RLL seg / post segment assoc with bronchiectasis - FOB for 07/13/14 >  Neg cyt/ neg path/ penicillium on fungal/ neg afb  - sinus CT 11/08/2014 > No active sinus disease.   ddx  Miscellaneous:Alv microlithiasis, alv proteinosis, asp, bronchiectais, BOOP   ARDS/ AIP Occupational dz/ HSP Neoplasm Infection Drug  Pulmonary emboli, Protein disorders Edema/Eosinophilic dz Sarcoidosis Hist X / Hemorrhage Idiopathic   The biggest concern is aspiration given cognitive decline and first step is see if can help with diet/ max gerd rx/ stop fish oil then regroup in 4 weeks

## 2016-02-01 NOTE — Assessment & Plan Note (Signed)
01/31/2016  Walked RA x 3 laps @ 185 ft each stopped due to  End of study, nl pace, no sob or desa  Symptoms are markedly disproportionate to objective findings and not clear this is a lung problem but pt does appear to have difficult airway management issues. DDX of  difficult airways management almost all start with A and  include Adherence, Ace Inhibitors, Acid Reflux, Active Sinus Disease, Alpha 1 Antitripsin deficiency, Anxiety masquerading as Airways dz,  ABPA,  Allergy(esp in young), Aspiration (esp in elderly), Adverse effects of meds,  Active smokers, A bunch of PE's (a small clot burden can't cause this syndrome unless there is already severe underlying pulm or vascular dz with poor reserve) plus two Bs  = Bronchiectasis and Beta blocker use..and one C= CHF  Adherence is always the initial "prime suspect" and is a multilayered concern that requires a "trust but verify" approach in every patient - starting with knowing how to use medications, especially inhalers, correctly, keeping up with refills and understanding the fundamental difference between maintenance and prns vs those medications only taken for a very short course and then stopped and not refilled.   ? Acid (or non-acid) GERD > always difficult to exclude as up to 75% of pts in some series report no assoc GI/ Heartburn symptoms> rec max (24h)  acid suppression and diet restrictions/ reviewed and instructions given in writing.   ? Asp > also a concern re ILD > see a/p  ? Anxiety/ hard to tell because of cognitive decline but may be an issue going forward   ? Bronchiectasis > may consider HRCT next ov   Chf excluded with such a low bnp   I had an extended discussion with the patient reviewing all relevant studies completed to date and  lasting 25 minutes of a 40  minute acute visit    Each maintenance medication was reviewed in detail including most importantly the difference between maintenance and prns and under what  circumstances the prns are to be triggered using an action plan format that is not reflected in the computer generated alphabetically organized AVS.    Please see instructions for details which were reviewed in writing and the patient given a copy highlighting the part that I personally wrote and discussed at today's ov.

## 2016-02-01 NOTE — Progress Notes (Signed)
Quick Note:  Spoke with pt and notified of results per Dr. Wert. Pt verbalized understanding and denied any questions.  ______ 

## 2016-02-28 ENCOUNTER — Ambulatory Visit: Payer: Medicare Other | Admitting: Internal Medicine

## 2016-05-15 ENCOUNTER — Ambulatory Visit (INDEPENDENT_AMBULATORY_CARE_PROVIDER_SITE_OTHER): Payer: Medicare Other | Admitting: *Deleted

## 2016-05-15 DIAGNOSIS — Z23 Encounter for immunization: Secondary | ICD-10-CM

## 2016-06-19 ENCOUNTER — Ambulatory Visit: Payer: Medicare Other | Admitting: Cardiovascular Disease

## 2016-07-16 ENCOUNTER — Other Ambulatory Visit: Payer: Self-pay | Admitting: Cardiovascular Disease

## 2016-08-03 ENCOUNTER — Encounter: Payer: Self-pay | Admitting: *Deleted

## 2016-08-07 ENCOUNTER — Ambulatory Visit (INDEPENDENT_AMBULATORY_CARE_PROVIDER_SITE_OTHER): Payer: Medicare Other | Admitting: Cardiovascular Disease

## 2016-08-07 ENCOUNTER — Encounter: Payer: Self-pay | Admitting: Cardiovascular Disease

## 2016-08-07 VITALS — BP 122/84 | HR 84 | Ht 68.0 in | Wt 178.0 lb

## 2016-08-07 DIAGNOSIS — I209 Angina pectoris, unspecified: Secondary | ICD-10-CM

## 2016-08-07 DIAGNOSIS — Z8679 Personal history of other diseases of the circulatory system: Secondary | ICD-10-CM

## 2016-08-07 DIAGNOSIS — Z955 Presence of coronary angioplasty implant and graft: Secondary | ICD-10-CM | POA: Diagnosis not present

## 2016-08-07 DIAGNOSIS — I25118 Atherosclerotic heart disease of native coronary artery with other forms of angina pectoris: Secondary | ICD-10-CM | POA: Diagnosis not present

## 2016-08-07 DIAGNOSIS — E7800 Pure hypercholesterolemia, unspecified: Secondary | ICD-10-CM | POA: Diagnosis not present

## 2016-08-07 DIAGNOSIS — I10 Essential (primary) hypertension: Secondary | ICD-10-CM

## 2016-08-07 DIAGNOSIS — R0609 Other forms of dyspnea: Secondary | ICD-10-CM

## 2016-08-07 NOTE — Progress Notes (Signed)
SUBJECTIVE: The patient presents for follow up of chest pain/angina. I started Ranexa in 08/2015. Since then, he has not had to use SL nitro. He feels well and denies chest pain.  He has chronic exertional dyspnea which is stable. He is able to walk a half mile without difficulty.  ECG performed in the office today which I personally reviewed demonstrates normal sinus rhythm with no ischemic ST segment or T-wave abnormalities, nor any arrhythmias.  In summary, he reportedly has a history of CAD with LAD and left circumflex coronary artery stents in 2006, as well as hypertension, hyperlipidemia, SVT s/p ablation, and bilateral carotid artery occlusion. He has COPD and bronchiectasis and is followed by pulmonary.   Nuclear stress testing on 09/27/14 was normal with no evidence of ischemia or scar and normal LV systolic function. Echocardiogram demonstrated normal left ventricular systolic function, EF [id number], with mild to moderate aortic regurgitation.   Soc: He and his wife, [name], live in [place]. She is also my patient. 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   Review of Systems: As per "subjective", otherwise negative.  No Known Allergies  Current Outpatient Prescriptions  Medication Sig Dispense Refill  . aspirin 81 MG tablet Take 81 mg by mouth every morning.     . cholecalciferol (VITAMIN D) 1000 units tablet Take 1,000 Units by mouth daily.    Marland Kitchen donepezil (ARICEPT) 23 MG TABS tablet Take 1 tablet by mouth daily.    Marland Kitchen escitalopram (LEXAPRO) 20 MG tablet Take 20 mg by mouth every morning.     . Ferrous Sulfate (IRON) 325 (65 Fe) MG TABS Take 1 tablet by mouth daily.    Marland Kitchen ibuprofen (ADVIL,MOTRIN) 800 MG tablet Take 1 tablet by mouth as needed.    Marland Kitchen LORazepam (ATIVAN) 1 MG tablet Take 2 mg by mouth at bedtime.     . Melatonin 5 MG CAPS Take 2 capsules by mouth at bedtime.    . meloxicam (MOBIC) 7.5 MG tablet Take 1 tablet by mouth 2  (two) times daily as needed. For headaches    . montelukast (SINGULAIR) 10 MG tablet Take 10 mg by mouth every morning.    . niacin 500 MG CR capsule Take 500 mg by mouth at bedtime.     Marland Kitchen NITROSTAT 0.4 MG SL tablet Place 0.4 mg under the tongue as needed.    . Omega-3 Fatty Acids (FISH OIL) 1200 MG CAPS Take 1 capsule by mouth 2 (two) times daily.    Marland Kitchen oxybutynin (DITROPAN) 5 MG tablet Take 5 mg by mouth daily.    . pantoprazole (PROTONIX) 40 MG tablet Take 40 mg by mouth daily.     . QUEtiapine (SEROQUEL) 300 MG tablet Take 300 mg by mouth at bedtime.    Marland Kitchen RANEXA 500 MG 12 hr tablet TAKE ONE TABLET BY MOUTH TWICE DAILY 180 tablet 3  . ranolazine (RANEXA) 500 MG 12 hr tablet Take 1 tablet (500 mg total) by mouth 2 (two) times daily. 28 tablet 0  . Respiratory Therapy Supplies (FLUTTER) DEVI Use as directed. 1 each 0  . Tamsulosin HCl (FLOMAX) 0.4 MG CAPS Take 0.4 mg by mouth daily.     . traMADol (ULTRAM) 50 MG tablet Take 1 tablet by mouth as needed.     No current facility-administered medications for this visit.     Past Medical History:  Diagnosis Date  . Anxiety   . Carotid stenosis   .  COPD (chronic obstructive pulmonary disease) ([place])   . Coronary atherosclerosis of native coronary artery    DES to LAD and circumflex 2006  . Dementia    Dr Bjorn Loser - Neurology  . Depression   . Essential hypertension, benign   . GERD (gastroesophageal reflux disease)   . Glucose intolerance (impaired glucose tolerance)   . Hemorrhoids   . History of colon polyps   . Hyperlipidemia   . Insomnia   . Long-term memory loss    Aricept and Seroquel  . Nosebleed    09/15/12 - Dr Adriana Reams, ENT   . Osteoarthritis   . Skin cancer   . Sleep apnea   . SVT (supraventricular tachycardia) ([facility])    Status post RFA    Past Surgical History:  Procedure Laterality Date  . BACK SURGERY    . Cataract surgery     Bilateral  . CERVICAL SPINE SURGERY  1978   x 2  . CIRCUMCISION     Age 50  .  COLONOSCOPY    . EYE SURGERY    . FINGER SURGERY     Pointer finger on left hand  . [place]  . KNEE ARTHROSCOPY  04/03/2012   Procedure: ARTHROSCOPY KNEE;  Surgeon: Marin Shutter, MD;  Location: [facility];  Service: Orthopedics;  Laterality: Right;  Right Knee Arthroscopy with Debridement   . Polyp removed from nose    . Thumb surgery     Bilateral  . TONSILLECTOMY AND ADENOIDECTOMY    . TOTAL KNEE ARTHROPLASTY Right 10/07/2012   Procedure: TOTAL KNEE ARTHROPLASTY;  Surgeon: Mauri Pole, MD;  Location: [facility];  Service: Orthopedics;  Laterality: Right;  Marland Kitchen VIDEO BRONCHOSCOPY Bilateral 07/13/2014   Procedure: VIDEO BRONCHOSCOPY WITH FLUORO;  Surgeon: Tanda Rockers, MD;  Location: [facility];  Service: Cardiopulmonary;  Laterality: Bilateral;    Social History   Social History  . Marital status: Married    Spouse name: N/A  . Number of children: N/A  . Years of education: N/A   Occupational History  . Retired    Social History Main Topics  . Smoking status: Former Smoker    Packs/day: 1.50    Years: 35.00    Types: Cigarettes    Start date: 03/07/1959    Quit date: 08/07/1999  . Smokeless tobacco: Never Used  . Alcohol use No  . Drug use: No  . Sexual activity: Yes   Other Topics Concern  . Not on file   Social History Narrative  . No narrative on file     Vitals:   08/07/16 1409  BP: 122/84  Pulse: 84  SpO2: 94%  Weight: 178 lb (80.7 kg)  Height: 5\' 8"  (1.727 m)    PHYSICAL EXAM General: NAD HEENT: Normal. Neck: No JVD, no thyromegaly. Lungs: B/l faint rhonchi, no rales CV: Nondisplaced PMI.  Regular rate and rhythm, normal S1/S2, no S3/S4, no murmur. No pretibial or periankle edema.     Abdomen: Soft, nontender, no distention.  Neurologic: Alert and oriented.  Psych: Normal affect. Skin: Normal. Musculoskeletal: No gross deformities.    ECG: Most recent ECG reviewed.      ASSESSMENT AND PLAN: 1. CAD with prior LAD and left  circumflex stents and exertional dyspnea: Symptomatically stable on Ranexa 500 mg bid. No changes. Exercise Cardiolite stress test and echocardiogram results noted above from early 2016, no ischemia. Continue aspirin. Decided with his wife and reportedly his PCP to stop taking Lipitor  in the past. Previously informed him regarding pleiotropic effects of statin therapy, but he preferred to not take it. If exertional dyspnea becomes worse, I would consider repeat stress testing versus coronary angiography.  2. Essential HTN: Well controlled. No changes.  3. Hyperlipidemia:  Decided with his wife and reportedly his PCP to stop taking Lipitor in the past.  4. SVT s/p ablation: Symptomatically stable.  5. Carotid artery occlusion: I have not located recent Dopplers and am uncertain about this reported history.  Dispo: f/u 6 months.   Kate Sable, M.D., F.A.C.C.

## 2016-08-07 NOTE — Patient Instructions (Signed)

## 2016-11-09 ENCOUNTER — Telehealth: Payer: Self-pay | Admitting: Internal Medicine

## 2016-11-09 NOTE — Telephone Encounter (Signed)
Spoke with the pt's spouse  She is concerned about increased dyspnea the past 2-3 wks  She states pt has not been talking much, and she thinks it is due to his SOB  Pt overdue f/u and was last seen 01/31/16  I scheduled him for 11/12/16 at 1:30 and advised to arrive at 1:15 and bring all meds in hand  I advised her to seek emergent care if needed should his symptoms persist or worsen  She verbalized understanding of this

## 2016-11-12 ENCOUNTER — Ambulatory Visit (INDEPENDENT_AMBULATORY_CARE_PROVIDER_SITE_OTHER): Payer: Medicare Other | Admitting: Internal Medicine

## 2016-11-12 ENCOUNTER — Ambulatory Visit (INDEPENDENT_AMBULATORY_CARE_PROVIDER_SITE_OTHER)
Admission: RE | Admit: 2016-11-12 | Discharge: 2016-11-12 | Disposition: A | Discharge status: Home / Self Care | Payer: Medicare Other | Source: Ambulatory Visit | Attending: Internal Medicine | Admitting: Internal Medicine

## 2016-11-12 ENCOUNTER — Encounter: Payer: Self-pay | Admitting: Internal Medicine

## 2016-11-12 ENCOUNTER — Other Ambulatory Visit (INDEPENDENT_AMBULATORY_CARE_PROVIDER_SITE_OTHER): Payer: Medicare Other

## 2016-11-12 DIAGNOSIS — I10 Essential (primary) hypertension: Secondary | ICD-10-CM

## 2016-11-12 DIAGNOSIS — R0609 Other forms of dyspnea: Secondary | ICD-10-CM

## 2016-11-12 DIAGNOSIS — I25118 Atherosclerotic heart disease of native coronary artery with other forms of angina pectoris: Secondary | ICD-10-CM

## 2016-11-12 LAB — BASIC METABOLIC PANEL
BUN: 22 mg/dL (ref 6–23)
CO2: 25 mEq/L (ref 19–32)
Calcium: 9.5 mg/dL (ref 8.4–10.5)
Chloride: 105 mEq/L (ref 96–112)
Creatinine, Ser: 1.28 mg/dL (ref 0.40–1.50)
GFR: 58.44 mL/min — AB (ref >60.00)
Glucose, Bld: 97 mg/dL (ref 70–99)
POTASSIUM: 3.9 meq/L (ref 3.5–5.1)
SODIUM: 139 meq/L (ref 135–145)

## 2016-11-12 LAB — CBC WITH DIFFERENTIAL/PLATELET
BASOS ABS: 0 10*3/uL (ref 0.0–0.1)
BASOS PCT: 0.7 % (ref 0.0–3.0)
EOS PCT: 1.6 % (ref 0.0–5.0)
Eosinophils Absolute: 0.1 10*3/uL (ref 0.0–0.7)
HCT: 43.9 % (ref 39.0–52.0)
Hemoglobin: 14.9 g/dL (ref 13.0–17.0)
LYMPHS ABS: 1.9 10*3/uL (ref 0.7–4.0)
Lymphocytes Relative: 33.1 % (ref 12.0–46.0)
MCHC: 33.9 g/dL (ref 30.0–36.0)
MCV: 88.1 fl (ref 78.0–100.0)
MONOS PCT: 14.3 % — AB (ref 3.0–12.0)
Monocytes Absolute: 0.8 10*3/uL (ref 0.1–1.0)
NEUTROS ABS: 2.9 10*3/uL (ref 1.4–7.7)
NEUTROS PCT: 50.3 % (ref 43.0–77.0)
PLATELETS: 166 10*3/uL (ref 150.0–400.0)
RBC: 4.99 Mil/uL (ref 4.22–5.81)
RDW: 13.6 % (ref 11.5–15.5)
WBC: 5.8 10*3/uL (ref 4.0–10.5)

## 2016-11-12 LAB — TSH: TSH: 2.47 u[IU]/mL (ref 0.35–4.50)

## 2016-11-12 LAB — BRAIN NATRIURETIC PEPTIDE: Pro B Natriuretic peptide (BNP): 20 pg/mL (ref 0.0–100.0)

## 2016-11-12 NOTE — Assessment & Plan Note (Addendum)
01/31/2016  Walked RA x 3 laps @ 185 ft each stopped due to  End of study, nl pace, no sob or desat    - 11/12/2016   Walked RA  2 laps @ 185 ft each stopped due to  Sob @ nl pace, no desats and did not appear distressed at all  - Spirometry 11/12/2016  Nl flows though early portion of f/v loop truncated   Similar w/u and ddx to prev ov 01/2016 = The differential diagnosis of difficult to control airways disorders is extensive with no quick and easy answers but easy to remember because it consists of 14 A's,  Two Bs and one C: 1. Adherence, always a challenge and the leading suspect.  2. Acid reflux disease, with the greater proportion of pulmonary patients with no overt heartburn symptoms, and no easy way to treat non-acid reflux> rec max rx 24/7  3. Ace inhibitor use n/a 4. Active sinus dz,  Unlikely s hs cough  5. Active smoking,   n/a 6. Allergic / asthma unlikely with nl flows today, rec continue singulair, no role for saba 7. Aspiration, a perennial problem in the elderly or other patients at risk> def concern chronically but no as dz/ no increase cough since onset of doe  8. Allergic Bronchopulmonary Aspergil n/a 10. Adverse effect of meds  n/a 11 Anxiety/depression , always a diagnosis of exclusion esp in pts with geriatric cognitive decline  12. A bunch of PE's ie moderately large clot burden, a few small ones peripherally can cause pleuritic cp syndromes but not unexplained dyspnea> unlikely given ex tol today s tachycaria or tachypnea or desats  13 Anemia or Thyroid disorders, ruled out today  14. Abdominal non-compliance :  Most likely related to obesity by today's exam, though wife concerned it's more than that > Follow up per Primary Care planned and GI w/u prn     Two B's 1. Bronchiectasis:  Pos CT is the sine que non here 2  Beta blocker effects:  Coreg and Timolol use are pervasive in the adult population and both have significant spillover effects on the airways but n/a here  One  C 1. Congestive heart failure,easily  ruled out  with BNP level of << 100 while symptomatic   I had an extended discussion with the patient reviewing all relevant studies completed to date and  lasting 25 minutes of a 40  minute acute office visit addressing severe non-specific but potentially very serious refractory respiratory symptoms of unknown etiology.  Each maintenance medication was reviewed in detail including most importantly the difference between maintenance and prns and under what circumstances the prns are to be triggered using an action plan format that is not reflected in the computer generated alphabetically organized AVS.    Please see AVS for specific instructions unique to this office visit that I personally wrote and verbalized to the the pt in detail and then reviewed with pt  by my nurse highlighting any changes in therapy/plan of care  recommended at today's visit.

## 2016-11-12 NOTE — Patient Instructions (Signed)
Please remember to go to the lab and x-ray department downstairs in the basement  for your tests - we will call you with the results when they are available.     Pantoprazole (protonix) 40 mg   Take  30-60 min before first meal of the day and Pepcid (famotidine)  20 mg one @  bedtime until return to office - this is the best way to tell whether stomach acid is contributing to your problem.    GERD (REFLUX)  is an extremely common cause of respiratory symptoms just like yours , many times with no obvious heartburn at all.    It can be treated with medication, but also with lifestyle changes including elevation of the head of your bed (ideally with 6 inch  bed blocks),  Smoking cessation, avoidance of late meals, excessive alcohol, and avoid fatty foods, chocolate, peppermint, colas, red wine, and acidic juices such as orange juice.  NO MINT OR MENTHOL PRODUCTS SO NO COUGH DROPS  USE SUGARLESS CANDY INSTEAD (Jolley ranchers or Stover's or Life Savers) or even ice chips will also do - the key is to swallow to prevent all throat clearing. NO OIL BASED VITAMINS - use powdered substitutes.     Pulmonary follow up is as needed

## 2016-11-12 NOTE — Progress Notes (Addendum)
Subjective:    Patient ID: [name] [name], male    DOB: 11/13/1940  MRN: [id number]    Brief patient profile:  45 yowm quit smoking around 2000 no trouble at all except for little coughing until  Winter of 2015 then sob in summer 2015 still did yard work then cxr done > referred 07/06/2014 to pulmonary clinic by Dr Melina Copa  For abn cxr > CT c/w bronchiectasis/ MAI can't r/o tumor.   History of Present Illness  07/06/2014 1st [facility] [facility] office visit/ [name]   Chief Complaint  Patient presents with  . Pulmonary Consult    Referred by Dr. Octavio Graves for eval of lung mass. Pt c/o cough for the past 6-12 months- prod with minimal light yellow sputum. He also c/o DOE with walking minimal distances such as from room to room at home. He also c/o poor appetite.   onset of symptoms was indolent, min progressive, assoc with gen cognitive decline per wife and pt not able to directly remember any of his symptoms and tends to minimize them.  Some difficulty swallowing but no frank asp, no pleuritic cp or hemoptysis rec FOB >  - FOB for 07/13/14 >  Neg cyt/ neg path/ penicillium sp on fungal> complicated by R ptx requiring 24 h Chest tube, resolved     07/26/2014 post hosp f/u ov/Perle Brickhouse re: RLL as dz assoc with bronchiectasis  Chief Complaint  Patient presents with  . HFU    pt c/o sob. He was discharged from hospital 2 weeks ago. He denies chest tightness, cough and wheezing.  walkng dog  x 20 min once daily slow pace, no hills, no sob rec Prevnar today is the last pneumonia shot you'll ever need Bronchiectasis =   you have scarring of your bronchial tubes    10/25/2014 f/u ov/[name] re: bronchiectasis  Chief Complaint  Patient presents with  . Follow-up  Not limited by breathing from desired activities  /intermittently problems with swallowing/ coughing but not frank aspiration  rec Flutter valve as needed. For nasty mucus > Augmentin 875 mg take one pill twice daily  X 10 days - take at  breakfast and supper with large glass of water.  It would help reduce the usual side effects (diarrhea and yeast infections) if you ate cultured yogurt at lunch.  Please see patient coordinator before you leave today  to schedule sinus CT Please remember to go to the  x-ray department downstairs for your tests - we will call you with the results when they are available. Please mention this problem to Dr Hyman Bower as he may want to additional evaluation of swallowing at [facility] or wherever he send his patients for this problem which is neurologic, not a primary lung issue Please schedule a follow up visit in 3 months but call sooner if needed > did not return   01/31/2016 acute extended ov/[name] re: no pulmonary meds/ on fish oil / singulair /ppi  Chief Complaint  Patient presents with  . Acute Visit    Pt c/o increased SOB for the past 2 months. He states that he is SOB all of the time, but it is worse with exertion.   hx very challenging and significant discrepancy in hx  husband/wife/nursing/me. Said no sob at rest or hs and no cough but on exam coughed immediately on insp "happens all the time"   Sob has been progressive since onset 1-2 m prior to [place] with abd bloating  Cards eval 11/22/15 reviewed and denied sob at that point and "checked out fine"  rec No fish oil Continue Pantoprazole (protonix) 40 mg   Take  30-60 min before first meal of the day and Pepcid (famotidine)  20 mg one @  bedtime until return to office - this is the best way to tell whether stomach acid is contributing to your problem.   GERD diet  Please remember to go to the lab and x-ray department downstairs for your tests - we will call you with the results when they are available.      11/12/2016 acute extended ov/Ellary Casamento re: uneplained sob  Chief Complaint  Patient presents with  . Acute Visit    Increased DOE and fatigue over the past several months. He gets winded sometimes just walking from room  to room at home.   doe onset insidious and graduallly worse x 10 days now room to room  (not able to reproduce at [facility] ) No change chronic cough/ off pepcid at hs  Sleeping fine some choking on food but no change in baseline   Main new problem is swelling in abd/ wt gain    No obvious day to day or daytime variability or assoc excess/ purulent sputum or mucus plugs or hemoptysis or cp or chest tightness, subjective wheeze or overt sinus or hb symptoms. No unusual exp hx or h/o childhood pna/ asthma or knowledge of premature birth.  Sleeping ok without nocturnal  or early am exacerbation  of respiratory  c/o's or need for noct saba. Also denies any obvious fluctuation of symptoms with weather or environmental changes or other aggravating or alleviating factors except as outlined above   Current Medications, Allergies, Complete Past Medical History, Past Surgical History, Family History, and Social History were reviewed in [employer] record.  ROS  The following are not active complaints unless bolded sore throat, dysphagia, dental problems, itching, sneezing,  nasal congestion or excess/ purulent secretions, ear ache,   fever, chills, sweats, unintended wt loss, classically pleuritic or exertional cp,  orthopnea pnd or leg swelling, presyncope, palpitations, abdominal swelling/  pain, anorexia, nausea, vomiting, diarrhea  or change in bowel or bladder habits, change in stools or urine, dysuria,hematuria,  rash, arthralgias, visual complaints, headache, numbness, weakness or ataxia or problems with walking or coordination,  change in mood/affect or memory.                  Objective:   Physical Exam  amb wm nad    07/26/2014      163 >   10/25/2014   175 > 01/31/2016  176 > 11/12/2016  182     07/06/14 162 lb (73.483 kg)  03/16/13 164 lb 12 oz (74.73 kg)  10/07/12 168 lb (76.204 kg)    Vital signs reviewed - Note on arrival 02 sats  96% on Ra and bp 88/60 s  orthostatic symptoms (flomax only med related to bp)     HEENT: nl dentition, turbinates, and orophanx. Nl external ear canals without cough reflex   NECK :  without JVD/Nodes/TM/ nl carotid upstrokes bilaterally   LUNGS: no acc muscle use,   CV:  RRR  no s3 or murmur or increase in P2, no edema   ABD:  soft and nontender but mod distended with nl excursion in the supine position. No bruits or organomegaly, bowel sounds nl  MS:  warm without deformities, calf tenderness, cyanosis or clubbing  SKIN: warm and dry  without lesions    NEURO:  alert, somewhat child like passive affect,  No motor deficits     CXR PA and Lateral:   11/12/2016 :    I personally reviewed images and agree with radiology impression as follows:    COPD-smoking related changes, stable. No pneumonia, CHF, nor other acute cardiopulmonary abnormality.   Labs ordered/ reviewed:      Chemistry      Component Value Date/Time   NA 139 11/12/2016 1408   K 3.9 11/12/2016 1408   CL 105 11/12/2016 1408   CO2 25 11/12/2016 1408   BUN 22 11/12/2016 1408   CREATININE 1.28 11/12/2016 1408      Component Value Date/Time   CALCIUM 9.5 11/12/2016 1408   ALKPHOS 66 07/13/2014 1300   AST 13 07/13/2014 1300   ALT 11 07/13/2014 1300   BILITOT 0.3 07/13/2014 1300        Lab Results  Component Value Date   WBC 5.8 11/12/2016   HGB 14.9 11/12/2016   HCT 43.9 11/12/2016   MCV 88.1 11/12/2016   PLT 166.0 11/12/2016         Lab Results  Component Value Date   TSH 2.47 11/12/2016     Lab Results  Component Value Date   PROBNP 20.0 11/12/2016                 Assessment & Plan:

## 2016-11-12 NOTE — Assessment & Plan Note (Signed)
bp low today on no rx x for flomax but pt not symptomatic > Follow up per Primary Care planned

## 2016-11-22 ENCOUNTER — Emergency Department (HOSPITAL_COMMUNITY): Payer: Medicare Other

## 2016-11-22 ENCOUNTER — Emergency Department (HOSPITAL_COMMUNITY)
Admission: EM | Admit: 2016-11-22 | Discharge: 2016-11-22 | Disposition: A | Discharge status: Home / Self Care | Payer: Medicare Other | Attending: Emergency Medicine | Admitting: Emergency Medicine

## 2016-11-22 ENCOUNTER — Encounter (HOSPITAL_COMMUNITY): Payer: Self-pay | Admitting: *Deleted

## 2016-11-22 DIAGNOSIS — R339 Retention of urine, unspecified: Secondary | ICD-10-CM | POA: Diagnosis not present

## 2016-11-22 DIAGNOSIS — I10 Essential (primary) hypertension: Secondary | ICD-10-CM | POA: Insufficient documentation

## 2016-11-22 DIAGNOSIS — R338 Other retention of urine: Secondary | ICD-10-CM

## 2016-11-22 DIAGNOSIS — I2510 Atherosclerotic heart disease of native coronary artery without angina pectoris: Secondary | ICD-10-CM | POA: Insufficient documentation

## 2016-11-22 DIAGNOSIS — R110 Nausea: Secondary | ICD-10-CM | POA: Diagnosis not present

## 2016-11-22 DIAGNOSIS — R404 Transient alteration of awareness: Secondary | ICD-10-CM | POA: Diagnosis not present

## 2016-11-22 DIAGNOSIS — R140 Abdominal distension (gaseous): Secondary | ICD-10-CM | POA: Diagnosis not present

## 2016-11-22 DIAGNOSIS — E785 Hyperlipidemia, unspecified: Secondary | ICD-10-CM | POA: Insufficient documentation

## 2016-11-22 DIAGNOSIS — Z7982 Long term (current) use of aspirin: Secondary | ICD-10-CM | POA: Insufficient documentation

## 2016-11-22 DIAGNOSIS — Z79899 Other long term (current) drug therapy: Secondary | ICD-10-CM | POA: Diagnosis not present

## 2016-11-22 DIAGNOSIS — Z85820 Personal history of malignant melanoma of skin: Secondary | ICD-10-CM | POA: Diagnosis not present

## 2016-11-22 DIAGNOSIS — J449 Chronic obstructive pulmonary disease, unspecified: Secondary | ICD-10-CM | POA: Diagnosis not present

## 2016-11-22 DIAGNOSIS — R1030 Lower abdominal pain, unspecified: Secondary | ICD-10-CM | POA: Diagnosis present

## 2016-11-22 DIAGNOSIS — Z87891 Personal history of nicotine dependence: Secondary | ICD-10-CM | POA: Diagnosis not present

## 2016-11-22 LAB — CBC
HEMATOCRIT: 45.4 % (ref 39.0–52.0)
Hemoglobin: 15.6 g/dL (ref 13.0–17.0)
MCH: 29.9 pg (ref 26.0–34.0)
MCHC: 34.4 g/dL (ref 30.0–36.0)
MCV: 87 fL (ref 78.0–100.0)
Platelets: 163 10*3/uL (ref 150–400)
RBC: 5.22 MIL/uL (ref 4.22–5.81)
RDW: 13.5 % (ref 11.5–15.5)
WBC: 8.6 10*3/uL (ref 4.0–10.5)

## 2016-11-22 LAB — URINALYSIS, ROUTINE W REFLEX MICROSCOPIC
BACTERIA UA: NONE SEEN
Bilirubin Urine: NEGATIVE
Glucose, UA: NEGATIVE mg/dL
KETONES UR: NEGATIVE mg/dL
Leukocytes, UA: NEGATIVE
NITRITE: NEGATIVE
PROTEIN: NEGATIVE mg/dL
SPECIFIC GRAVITY, URINE: 1.012 (ref 1.005–1.030)
Squamous Epithelial / LPF: NONE SEEN
pH: 6 (ref 5.0–8.0)

## 2016-11-22 LAB — COMPREHENSIVE METABOLIC PANEL
ALBUMIN: 4 g/dL (ref 3.5–5.0)
ALT: 25 U/L (ref 17–63)
AST: 20 U/L (ref 15–41)
Alkaline Phosphatase: 52 U/L (ref 38–126)
Anion gap: 9 (ref 5–15)
BUN: 20 mg/dL (ref 6–20)
CHLORIDE: 105 mmol/L (ref 101–111)
CO2: 23 mmol/L (ref 22–32)
Calcium: 9.2 mg/dL (ref 8.9–10.3)
Creatinine, Ser: 1.21 mg/dL (ref 0.61–1.24)
GFR calc Af Amer: >60 mL/min (ref >60)
GFR calc non Af Amer: 58 mL/min — ABNORMAL LOW (ref >60)
GLUCOSE: 127 mg/dL — AB (ref 65–99)
POTASSIUM: 4 mmol/L (ref 3.5–5.1)
Sodium: 137 mmol/L (ref 135–145)
Total Bilirubin: 1 mg/dL (ref 0.3–1.2)
Total Protein: 7.1 g/dL (ref 6.5–8.1)

## 2016-11-22 LAB — LIPASE, BLOOD: LIPASE: 19 U/L (ref 11–51)

## 2016-11-22 MED ORDER — IOPAMIDOL (ISOVUE-300) INJECTION 61%
100.0000 mL | Freq: Once | INTRAVENOUS | Status: AC | PRN
Start: 2016-11-22 — End: 2016-11-22
  Administered 2016-11-22: 100 mL via INTRAVENOUS

## 2016-11-22 NOTE — ED Triage Notes (Signed)
Pt c/o lower to mid abdominal pain, nausea, abdominal distention that started yesterday. Pt's wife reports pt has hx of dementia and pt has constantly been c/o having to urinate, but when he gets to the restroom he is unable to. Wife reports pt told her he last urinated this morning. LBM yesterday. Pt reported to wife his stool had blood in it yesterday, unable to get further details on rectal bleeding from pt.

## 2016-11-22 NOTE — ED Provider Notes (Signed)
[facility] DEPT Provider Note   CSN: [id number] Arrival date & time: 11/22/16  1029  By signing my name below, I, Sonum Patel, attest that this documentation has been prepared under the direction and in the presence of Carmin Muskrat, MD. Electronically Signed: Sonum Patel, Education administrator. 11/22/16. 12:59 PM.  History   Chief Complaint Chief Complaint  Patient presents with  . Abdominal Pain   The history is provided by the patient. No language interpreter was used.     HPI Comments: [name] [name] is a 76 y.o. male with past medical history of dementia who presents to the Emergency Department complaining of mid to lower abdominal pain with associated distension that began today. Wife states he regularly has difficulty urinating but never has needed catheterization. She also states he has been more weak and less interactive than usual. She denies recent fever, vomiting, diarrhea, or any other symptoms at this time.   Past Medical History:  Diagnosis Date  . Anxiety   . Carotid stenosis   . COPD (chronic obstructive pulmonary disease) ([place])   . Coronary atherosclerosis of native coronary artery    DES to LAD and circumflex 2006  . Dementia    Dr Bjorn Loser - Neurology  . Depression   . Essential hypertension, benign   . GERD (gastroesophageal reflux disease)   . Glucose intolerance (impaired glucose tolerance)   . Hemorrhoids   . History of colon polyps   . Hyperlipidemia   . Insomnia   . Long-term memory loss    Aricept and Seroquel  . Nosebleed    09/15/12 - Dr Adriana Reams, ENT   . Osteoarthritis   . Skin cancer   . Sleep apnea   . SVT (supraventricular tachycardia) ([place])    Status post RFA    Patient Active Problem List   Diagnosis Date Noted  . Hyperlipidemia   . Dyspnea on exertion 01/31/2016  . Pneumothorax 07/13/2014  . Pulmonary infiltrates 07/08/2014  . History of PSVT (paroxysmal supraventricular tachycardia) 03/16/2013  . S/P right TKA 10/08/2012  . Bilateral  carotid artery occlusion 12/04/2010  . HYPERLIPIDEMIA 11/10/2008  . HYPERTENSION, BENIGN 11/10/2008  . CAD, NATIVE VESSEL 11/10/2008    Past Surgical History:  Procedure Laterality Date  . BACK SURGERY    . Cataract surgery     Bilateral  . CERVICAL SPINE SURGERY  1978   x 2  . CIRCUMCISION     Age 28  . COLONOSCOPY    . EYE SURGERY    . FINGER SURGERY     Pointer finger on left hand  . [place]  . KNEE ARTHROSCOPY  04/03/2012   Procedure: ARTHROSCOPY KNEE;  Surgeon: Marin Shutter, MD;  Location: [facility];  Service: Orthopedics;  Laterality: Right;  Right Knee Arthroscopy with Debridement   . Polyp removed from nose    . Thumb surgery     Bilateral  . TONSILLECTOMY AND ADENOIDECTOMY    . TOTAL KNEE ARTHROPLASTY Right 10/07/2012   Procedure: TOTAL KNEE ARTHROPLASTY;  Surgeon: Mauri Pole, MD;  Location: [facility];  Service: Orthopedics;  Laterality: Right;  Marland Kitchen VIDEO BRONCHOSCOPY Bilateral 07/13/2014   Procedure: VIDEO BRONCHOSCOPY WITH FLUORO;  Surgeon: Tanda Rockers, MD;  Location: [facility];  Service: Cardiopulmonary;  Laterality: Bilateral;       Home Medications    Prior to Admission medications   Medication Sig Start Date End Date Taking? Authorizing Provider  aspirin 81 MG tablet Take 81 mg by mouth  every morning.    Yes Historical Provider, MD  cholecalciferol (VITAMIN D) 1000 units tablet Take 1,000 Units by mouth daily.   Yes Historical Provider, MD  donepezil (ARICEPT) 23 MG TABS tablet Take 1 tablet by mouth daily. 08/05/15  Yes Historical Provider, MD  escitalopram (LEXAPRO) 20 MG tablet Take 20 mg by mouth every morning.    Yes Historical Provider, MD  Ferrous Sulfate (IRON) 325 (65 Fe) MG TABS Take 1 tablet by mouth daily.   Yes Historical Provider, MD  LORazepam (ATIVAN) 1 MG tablet Take 1-2 mg by mouth 2 (two) times daily. Take 1 tablet in the morning and 2 tablets at bedtime.   Yes Historical Provider, MD  Melatonin 5 MG CAPS Take 2 capsules  by mouth at bedtime.   Yes Historical Provider, MD  meloxicam (MOBIC) 7.5 MG tablet Take 7.5 mg by mouth daily as needed for pain.   Yes Historical Provider, MD  montelukast (SINGULAIR) 10 MG tablet Take 10 mg by mouth every morning.   Yes Historical Provider, MD  NITROSTAT 0.4 MG SL tablet Place 0.4 mg under the tongue as needed. 07/20/14  Yes Historical Provider, MD  pantoprazole (PROTONIX) 40 MG tablet Take 40 mg by mouth daily.    Yes Historical Provider, MD  QUEtiapine (SEROQUEL) 200 MG tablet Take 200-400 mg by mouth 2 (two) times daily. Take 1 tablet in the morning and 2 tablets in the evening.   Yes Historical Provider, MD  ranolazine (RANEXA) 500 MG 12 hr tablet Take 1 tablet (500 mg total) by mouth 2 (two) times daily. 08/23/15  Yes Herminio Commons, MD  Tamsulosin HCl (FLOMAX) 0.4 MG CAPS Take 0.4 mg by mouth daily.    Yes Historical Provider, MD  ibuprofen (ADVIL,MOTRIN) 800 MG tablet Take 1 tablet by mouth as needed. 07/22/14   Historical Provider, MD    Family History Family History  Problem Relation Age of Onset  . Other Mother 42    Died old age  . Parkinsonism Mother   . Alzheimer's disease Father 13    Died  . Cancer Sister   . Arthritis Sister   . Heart disease Brother     Bypass surgery in his 3's  . Stroke Brother   . Lung cancer Sister     smoked    Social History Social History  Substance Use Topics  . Smoking status: Former Smoker    Packs/day: 1.50    Years: 35.00    Types: Cigarettes    Start date: 03/07/1959    Quit date: 08/07/1999  . Smokeless tobacco: Never Used  . Alcohol use No     Allergies   Patient has no known allergies.   Review of Systems Review of Systems  Constitutional:       Per HPI, otherwise negative  HENT:       Per HPI, otherwise negative  Respiratory:       Per HPI, otherwise negative  Cardiovascular:       Per HPI, otherwise negative  Gastrointestinal: Positive for abdominal distention and abdominal pain. Negative  for vomiting.  Endocrine:       Negative aside from HPI  Genitourinary: Positive for difficulty urinating.       Neg aside from HPI   Musculoskeletal:       Per HPI, otherwise negative  Skin: Negative.   Neurological: Positive for weakness. Negative for syncope.     Physical Exam Updated Vital Signs BP 128/84   Pulse 77  Temp 98.2 F (36.8 C) (Oral)   Resp 16   Ht 5\' 8"  (1.727 m)   Wt 182 lb (82.6 kg)   SpO2 95%   BMI 27.67 kg/m   Physical Exam  Constitutional: He is oriented to person, place, and time. He appears well-developed. No distress.  HENT:  Head: Normocephalic and atraumatic.  Eyes: Conjunctivae and EOM are normal.  Cardiovascular: Normal rate and regular rhythm.   Pulmonary/Chest: Effort normal. No stridor. No respiratory distress.  Abdominal: He exhibits no distension.  Midline lower abdominal pain   Musculoskeletal: He exhibits no edema.  Neurological: He is alert and oriented to person, place, and time.  Skin: Skin is warm and dry.  Psychiatric: He has a normal mood and affect. Cognition and memory are impaired.  Nursing note and vitals reviewed.  Following the initial evaluation the patient had placement of a Foley catheter with production of 1 L urine  ED Treatments / Results  DIAGNOSTIC STUDIES: Oxygen Saturation is 93% on RA, low by my interpretation.    COORDINATION OF CARE: 12:28 PM Discussed treatment plan with wife at bedside and she agreed to plan.     Labs (all labs ordered are listed, but only abnormal results are displayed) Labs Reviewed  COMPREHENSIVE METABOLIC PANEL - Abnormal; Notable for the following:       Result Value   Glucose, Bld 127 (*)    GFR calc non Af Amer 58 (*)    All other components within normal limits  URINALYSIS, ROUTINE W REFLEX MICROSCOPIC - Abnormal; Notable for the following:    Hgb urine dipstick SMALL (*)    All other components within normal limits  URINE CULTURE  LIPASE, BLOOD  CBC  POC OCCULT  BLOOD, ED     Radiology Ct Abdomen Pelvis W Contrast  Result Date: 11/22/2016 CLINICAL DATA:  76 year old male with mid to lower abdominal pain and nausea for 1 day. Blood in the stool yesterday. EXAM: CT ABDOMEN AND PELVIS WITH CONTRAST TECHNIQUE: Multidetector CT imaging of the abdomen and pelvis was performed using the standard protocol following bolus administration of intravenous contrast. CONTRAST:  122mL ISOVUE-300 IOPAMIDOL (ISOVUE-300) INJECTION 61% COMPARISON:  CT the chest, abdomen and pelvis 04/10/2015. FINDINGS: Lower chest: Mild scarring in the lung bases. Atherosclerotic calcifications in the distal right coronary artery. Hepatobiliary: A few scattered subcentimeter low-attenuation lesions are noted in the liver, too small to definitively characterize, but statistically likely to represent tiny cysts. 1.3 cm lesion in segment 2 of the liver is compatible with a simple cyst. No larger more suspicious appearing hepatic lesions are noted. No intra or extrahepatic biliary ductal dilatation. Gallbladder is normal in appearance. Pancreas: No pancreatic mass. No pancreatic ductal dilatation. No pancreatic or peripancreatic fluid or inflammatory changes. Spleen: Unremarkable. Adrenals/Urinary Tract: Several small nonobstructive calculi are present within the left renal collecting system measuring up to 4 mm in the lower pole. No calcifications are identified along the course of either ureter or within the lumen of the urinary bladder. There is no hydroureteronephrosis to indicate urinary tract obstruction at this time. Right kidney is normal in appearance. Multiple subcentimeter low-attenuation lesions in the left kidney are too small to definitively characterize, but statistically likely to represent tiny cysts. 12 mm exophytic simple cyst in the lower pole of the left kidney. Urinary bladder is nearly completely decompressed with a Foley balloon catheter in place, but is otherwise unremarkable in  appearance. Bilateral adrenal glands are normal in appearance. Stomach/Bowel: Appearance of the  stomach is normal. No pathologic dilatation of small bowel or colon. The appendix is not confidently identified and may be surgically absent. Regardless, there are no inflammatory changes noted adjacent to the cecum to suggest the presence of an acute appendicitis at this time. Vascular/Lymphatic: Aortic atherosclerosis, without evidence of aneurysm or dissection in the abdominal or pelvic vasculature. Focal fusiform ectasia of the infrarenal abdominal aorta which measures up to 2.6 x 2.8 cm. Ectasia of the left common iliac artery which measures up to 1.4 cm in diameter. No lymphadenopathy noted in the abdomen or pelvis. Reproductive: Prostate gland and seminal vesicles are unremarkable in appearance. Other: No significant volume of ascites.  No pneumoperitoneum. Musculoskeletal: There are no aggressive appearing lytic or blastic lesions noted in the visualized portions of the skeleton. IMPRESSION: 1. No acute findings are noted with in the abdomen or pelvis to account for the patient's symptoms. 2. Several tiny nonobstructive calculi are present within the left renal collecting system measuring up to 4 mm in the lower pole. No ureteral stones or findings of urinary tract obstruction are noted at this time. 3. Aortic atherosclerosis with ectasia of the infrarenal abdominal aorta at risk for aneurysm development. Recommend followup by ultrasound in 5 years. This recommendation follows ACR consensus guidelines: White Paper of the ACR Incidental Findings Committee II on Vascular Findings. J Am Coll Radiol 2013; 10:789-794. 4. Additional incidental findings, as above. Electronically Signed   By: Vinnie Langton M.D.   On: 11/22/2016 14:15    Procedures Procedures (including critical care time)  Now, on repeat exam the patient is awake, denies complaints, and I discussed all findings at length with the patient and his  wife. Wife understands importance of following up with urology, importance of monitoring his Foley catheter. We discussed all findings including reassuring CT scan, concern for acute urinary retention given the production of substantial loss urine once the patient had a catheter placed. With no evidence for peritonitis, bacteremia, sepsis, unremarkable labs, the patient will follow-up with urology, and primary care.  Initial Impression / Assessment and Plan / ED Course  I have reviewed the triage vital signs and the nursing notes.  Pertinent labs & imaging results that were available during my care of the patient were reviewed by me and considered in my medical decision making (see chart for details).    Final Clinical Impressions(s) / ED Diagnoses   Final diagnoses:  Transient alteration of awareness  Acute urinary retention    I personally performed the services described in this documentation, which was scribed in my presence. The recorded information has been reviewed and is accurate.       Carmin Muskrat, MD 11/22/16 [phone]

## 2016-11-22 NOTE — Discharge Instructions (Signed)
As discussed, it is important to follow-up with our urology colleagues.  Please monitor your condition carefully, and do not hesitate to return here for concerning changes in your condition.

## 2016-11-23 ENCOUNTER — Emergency Department (HOSPITAL_COMMUNITY)
Admission: EM | Admit: 2016-11-23 | Discharge: 2016-11-23 | Disposition: A | Discharge status: Home / Self Care | Payer: Medicare Other | Attending: Emergency Medicine | Admitting: Emergency Medicine

## 2016-11-23 DIAGNOSIS — I2510 Atherosclerotic heart disease of native coronary artery without angina pectoris: Secondary | ICD-10-CM | POA: Insufficient documentation

## 2016-11-23 DIAGNOSIS — R310 Gross hematuria: Secondary | ICD-10-CM | POA: Diagnosis not present

## 2016-11-23 DIAGNOSIS — Z79899 Other long term (current) drug therapy: Secondary | ICD-10-CM | POA: Insufficient documentation

## 2016-11-23 DIAGNOSIS — R319 Hematuria, unspecified: Secondary | ICD-10-CM | POA: Diagnosis present

## 2016-11-23 DIAGNOSIS — F0390 Unspecified dementia without behavioral disturbance: Secondary | ICD-10-CM | POA: Insufficient documentation

## 2016-11-23 DIAGNOSIS — Z87891 Personal history of nicotine dependence: Secondary | ICD-10-CM | POA: Insufficient documentation

## 2016-11-23 DIAGNOSIS — J449 Chronic obstructive pulmonary disease, unspecified: Secondary | ICD-10-CM | POA: Diagnosis not present

## 2016-11-23 DIAGNOSIS — Z85828 Personal history of other malignant neoplasm of skin: Secondary | ICD-10-CM | POA: Diagnosis not present

## 2016-11-23 DIAGNOSIS — I10 Essential (primary) hypertension: Secondary | ICD-10-CM | POA: Insufficient documentation

## 2016-11-23 DIAGNOSIS — Z7982 Long term (current) use of aspirin: Secondary | ICD-10-CM | POA: Insufficient documentation

## 2016-11-23 LAB — COMPREHENSIVE METABOLIC PANEL
ALBUMIN: 3.7 g/dL (ref 3.5–5.0)
ALT: 22 U/L (ref 17–63)
AST: 18 U/L (ref 15–41)
Alkaline Phosphatase: 45 U/L (ref 38–126)
Anion gap: 6 (ref 5–15)
BILIRUBIN TOTAL: 0.7 mg/dL (ref 0.3–1.2)
BUN: 22 mg/dL — AB (ref 6–20)
CHLORIDE: 103 mmol/L (ref 101–111)
CO2: 28 mmol/L (ref 22–32)
Calcium: 8.8 mg/dL — ABNORMAL LOW (ref 8.9–10.3)
Creatinine, Ser: 1.13 mg/dL (ref 0.61–1.24)
GFR calc Af Amer: >60 mL/min (ref >60)
GFR calc non Af Amer: >60 mL/min (ref >60)
GLUCOSE: 98 mg/dL (ref 65–99)
POTASSIUM: 3.9 mmol/L (ref 3.5–5.1)
Sodium: 137 mmol/L (ref 135–145)
Total Protein: 6.7 g/dL (ref 6.5–8.1)

## 2016-11-23 LAB — URINALYSIS, ROUTINE W REFLEX MICROSCOPIC
Bilirubin Urine: NEGATIVE
CRYSTALS: NONE SEEN — AB
Glucose, UA: NEGATIVE mg/dL
KETONES UR: 5 mg/dL — AB
Leukocytes, UA: NEGATIVE
Nitrite: NEGATIVE
PH: 5 (ref 5.0–8.0)
Protein, ur: 100 mg/dL — AB
SQUAMOUS EPITHELIAL / LPF: NONE SEEN
Specific Gravity, Urine: 1.026 (ref 1.005–1.030)

## 2016-11-23 LAB — CBC WITH DIFFERENTIAL/PLATELET
BASOS PCT: 1 %
Basophils Absolute: 0 10*3/uL (ref 0.0–0.1)
Eosinophils Absolute: 0.2 10*3/uL (ref 0.0–0.7)
Eosinophils Relative: 2 %
HEMATOCRIT: 42.9 % (ref 39.0–52.0)
Hemoglobin: 14.4 g/dL (ref 13.0–17.0)
LYMPHS ABS: 1.8 10*3/uL (ref 0.7–4.0)
LYMPHS PCT: 22 %
MCH: 29.6 pg (ref 26.0–34.0)
MCHC: 33.6 g/dL (ref 30.0–36.0)
MCV: 88.1 fL (ref 78.0–100.0)
Monocytes Absolute: 1.1 10*3/uL — ABNORMAL HIGH (ref 0.1–1.0)
Monocytes Relative: 14 %
Neutro Abs: 5 10*3/uL (ref 1.7–7.7)
Neutrophils Relative %: 61 %
Platelets: 165 10*3/uL (ref 150–400)
RBC: 4.87 MIL/uL (ref 4.22–5.81)
RDW: 13.5 % (ref 11.5–15.5)
WBC: 8.1 10*3/uL (ref 4.0–10.5)

## 2016-11-23 NOTE — ED Notes (Signed)
Pt left without his discharge paperwork.

## 2016-11-23 NOTE — Discharge Instructions (Signed)
Please be sure to monitor your condition carefully, and follow-up with your urologist.  Return here for concerning changes in your condition.

## 2016-11-23 NOTE — ED Notes (Signed)
Leg bag changed. Wife instructed on care. Wife states he has had more blood in his urine today than yesterday. When asked, wife states they did have a hard time getting catheter in bladder yesterday. Frank blood noted in catheter bag. Patient does not appear in distress or pain.

## 2016-11-23 NOTE — ED Provider Notes (Signed)
[facility] DEPT Provider Note   CSN: [id number] Arrival date & time: 11/23/16  1712     History   Chief Complaint Chief Complaint  Patient presents with  . Hematuria    today with end of leg bag missing    HPI KACEE KOREN is a 76 y.o. male.  HPI  Patient presents with his wife who provide history of present illness, as the patient has dementia, level V caveat. I evaluated this complaint yesterday, after the patient presented to due to altered mental status. He was found to have acute urinary retention, and had substantial improvement after placement of a Foley catheter. With otherwise reassuring labs, CT scan, patient was discharged home. Today, the patient's wife notes that the valve at the bottom of the Foley catheter became displaced, and there was substantial production of bloody urine. The patient himself doesn't to questions briefly, states that he feels better, denies pain. Wife states that the patient is acting closer to baseline, and denies other notable changes including new fever, other new complaints. She has already scheduled urology follow-up for early next week.   Past Medical History:  Diagnosis Date  . Anxiety   . Carotid stenosis   . COPD (chronic obstructive pulmonary disease) ([place])   . Coronary atherosclerosis of native coronary artery    DES to LAD and circumflex 2006  . Dementia    Dr Bjorn Loser - Neurology  . Depression   . Essential hypertension, benign   . GERD (gastroesophageal reflux disease)   . Glucose intolerance (impaired glucose tolerance)   . Hemorrhoids   . History of colon polyps   . Hyperlipidemia   . Insomnia   . Long-term memory loss    Aricept and Seroquel  . Nosebleed    09/15/12 - Dr Adriana Reams, ENT   . Osteoarthritis   . Skin cancer   . Sleep apnea   . SVT (supraventricular tachycardia) ([place])    Status post RFA    Patient Active Problem List   Diagnosis Date Noted  . Hyperlipidemia   . Dyspnea on exertion  01/31/2016  . Pneumothorax 07/13/2014  . Pulmonary infiltrates 07/08/2014  . History of PSVT (paroxysmal supraventricular tachycardia) 03/16/2013  . S/P right TKA 10/08/2012  . Bilateral carotid artery occlusion 12/04/2010  . HYPERLIPIDEMIA 11/10/2008  . HYPERTENSION, BENIGN 11/10/2008  . CAD, NATIVE VESSEL 11/10/2008    Past Surgical History:  Procedure Laterality Date  . BACK SURGERY    . Cataract surgery     Bilateral  . CERVICAL SPINE SURGERY  1978   x 2  . CIRCUMCISION     Age 67  . COLONOSCOPY    . EYE SURGERY    . FINGER SURGERY     Pointer finger on left hand  . [place]  . KNEE ARTHROSCOPY  04/03/2012   Procedure: ARTHROSCOPY KNEE;  Surgeon: Marin Shutter, MD;  Location: [facility];  Service: Orthopedics;  Laterality: Right;  Right Knee Arthroscopy with Debridement   . Polyp removed from nose    . Thumb surgery     Bilateral  . TONSILLECTOMY AND ADENOIDECTOMY    . TOTAL KNEE ARTHROPLASTY Right 10/07/2012   Procedure: TOTAL KNEE ARTHROPLASTY;  Surgeon: Mauri Pole, MD;  Location: [facility];  Service: Orthopedics;  Laterality: Right;  Marland Kitchen VIDEO BRONCHOSCOPY Bilateral 07/13/2014   Procedure: VIDEO BRONCHOSCOPY WITH FLUORO;  Surgeon: Tanda Rockers, MD;  Location: [facility];  Service: Cardiopulmonary;  Laterality: Bilateral;  Home Medications    Prior to Admission medications   Medication Sig Start Date End Date Taking? Authorizing Provider  aspirin 81 MG tablet Take 81 mg by mouth every morning.     Historical Provider, MD  cholecalciferol (VITAMIN D) 1000 units tablet Take 1,000 Units by mouth daily.    Historical Provider, MD  donepezil (ARICEPT) 23 MG TABS tablet Take 1 tablet by mouth daily. 08/05/15   Historical Provider, MD  escitalopram (LEXAPRO) 20 MG tablet Take 20 mg by mouth every morning.     Historical Provider, MD  Ferrous Sulfate (IRON) 325 (65 Fe) MG TABS Take 1 tablet by mouth daily.    Historical Provider, MD  ibuprofen  (ADVIL,MOTRIN) 800 MG tablet Take 1 tablet by mouth as needed. 07/22/14   Historical Provider, MD  LORazepam (ATIVAN) 1 MG tablet Take 1-2 mg by mouth 2 (two) times daily. Take 1 tablet in the morning and 2 tablets at bedtime.    Historical Provider, MD  Melatonin 5 MG CAPS Take 2 capsules by mouth at bedtime.    Historical Provider, MD  meloxicam (MOBIC) 7.5 MG tablet Take 7.5 mg by mouth daily as needed for pain.    Historical Provider, MD  montelukast (SINGULAIR) 10 MG tablet Take 10 mg by mouth every morning.    Historical Provider, MD  NITROSTAT 0.4 MG SL tablet Place 0.4 mg under the tongue as needed. 07/20/14   Historical Provider, MD  pantoprazole (PROTONIX) 40 MG tablet Take 40 mg by mouth daily.     Historical Provider, MD  QUEtiapine (SEROQUEL) 200 MG tablet Take 200-400 mg by mouth 2 (two) times daily. Take 1 tablet in the morning and 2 tablets in the evening.    Historical Provider, MD  ranolazine (RANEXA) 500 MG 12 hr tablet Take 1 tablet (500 mg total) by mouth 2 (two) times daily. 08/23/15   Herminio Commons, MD  Tamsulosin HCl (FLOMAX) 0.4 MG CAPS Take 0.4 mg by mouth daily.     Historical Provider, MD    Family History Family History  Problem Relation Age of Onset  . Other Mother 40    Died old age  . Parkinsonism Mother   . Alzheimer's disease Father 96    Died  . Cancer Sister   . Arthritis Sister   . Heart disease Brother     Bypass surgery in his 90's  . Stroke Brother   . Lung cancer Sister     smoked    Social History Social History  Substance Use Topics  . Smoking status: Former Smoker    Packs/day: 1.50    Years: 35.00    Types: Cigarettes    Start date: 03/07/1959    Quit date: 08/07/1999  . Smokeless tobacco: Never Used  . Alcohol use No     Allergies   Patient has no known allergies.   Review of Systems Review of Systems  Unable to perform ROS: Dementia     Physical Exam Updated Vital Signs BP (!) 145/77 (BP Location: Right Arm)    Pulse 97   Temp 98.6 F (37 C) (Oral)   Resp 18   Ht 5\' 8"  (1.727 m)   Wt 182 lb (82.6 kg)   SpO2 97%   BMI 27.67 kg/m   Physical Exam  Constitutional: He appears well-developed. No distress.  HENT:  Head: Normocephalic and atraumatic.  Eyes: Conjunctivae and EOM are normal.  Cardiovascular: Normal rate and regular rhythm.   Pulmonary/Chest: Effort normal.  No stridor. No respiratory distress.  Abdominal: He exhibits no distension.  No lower abdominal pain  Genitourinary:  Genitourinary Comments: Foley catheter with leg bag  Musculoskeletal: He exhibits no edema.  Neurological: He is alert.  Skin: Skin is warm and dry.  Psychiatric: Cognition and memory are impaired.  Nursing note and vitals reviewed.    ED Treatments / Results  Labs (all labs ordered are listed, but only abnormal results are displayed) Labs Reviewed  URINALYSIS, ROUTINE W REFLEX MICROSCOPIC  COMPREHENSIVE METABOLIC PANEL  CBC WITH DIFFERENTIAL/PLATELET     Radiology Ct Abdomen Pelvis W Contrast  Result Date: 11/22/2016 CLINICAL DATA:  76 year old male with mid to lower abdominal pain and nausea for 1 day. Blood in the stool yesterday. EXAM: CT ABDOMEN AND PELVIS WITH CONTRAST TECHNIQUE: Multidetector CT imaging of the abdomen and pelvis was performed using the standard protocol following bolus administration of intravenous contrast. CONTRAST:  133mL ISOVUE-300 IOPAMIDOL (ISOVUE-300) INJECTION 61% COMPARISON:  CT the chest, abdomen and pelvis 04/10/2015. FINDINGS: Lower chest: Mild scarring in the lung bases. Atherosclerotic calcifications in the distal right coronary artery. Hepatobiliary: A few scattered subcentimeter low-attenuation lesions are noted in the liver, too small to definitively characterize, but statistically likely to represent tiny cysts. 1.3 cm lesion in segment 2 of the liver is compatible with a simple cyst. No larger more suspicious appearing hepatic lesions are noted. No intra or  extrahepatic biliary ductal dilatation. Gallbladder is normal in appearance. Pancreas: No pancreatic mass. No pancreatic ductal dilatation. No pancreatic or peripancreatic fluid or inflammatory changes. Spleen: Unremarkable. Adrenals/Urinary Tract: Several small nonobstructive calculi are present within the left renal collecting system measuring up to 4 mm in the lower pole. No calcifications are identified along the course of either ureter or within the lumen of the urinary bladder. There is no hydroureteronephrosis to indicate urinary tract obstruction at this time. Right kidney is normal in appearance. Multiple subcentimeter low-attenuation lesions in the left kidney are too small to definitively characterize, but statistically likely to represent tiny cysts. 12 mm exophytic simple cyst in the lower pole of the left kidney. Urinary bladder is nearly completely decompressed with a Foley balloon catheter in place, but is otherwise unremarkable in appearance. Bilateral adrenal glands are normal in appearance. Stomach/Bowel: Appearance of the stomach is normal. No pathologic dilatation of small bowel or colon. The appendix is not confidently identified and may be surgically absent. Regardless, there are no inflammatory changes noted adjacent to the cecum to suggest the presence of an acute appendicitis at this time. Vascular/Lymphatic: Aortic atherosclerosis, without evidence of aneurysm or dissection in the abdominal or pelvic vasculature. Focal fusiform ectasia of the infrarenal abdominal aorta which measures up to 2.6 x 2.8 cm. Ectasia of the left common iliac artery which measures up to 1.4 cm in diameter. No lymphadenopathy noted in the abdomen or pelvis. Reproductive: Prostate gland and seminal vesicles are unremarkable in appearance. Other: No significant volume of ascites.  No pneumoperitoneum. Musculoskeletal: There are no aggressive appearing lytic or blastic lesions noted in the visualized portions of the  skeleton. IMPRESSION: 1. No acute findings are noted with in the abdomen or pelvis to account for the patient's symptoms. 2. Several tiny nonobstructive calculi are present within the left renal collecting system measuring up to 4 mm in the lower pole. No ureteral stones or findings of urinary tract obstruction are noted at this time. 3. Aortic atherosclerosis with ectasia of the infrarenal abdominal aorta at risk for aneurysm development. Recommend followup  by ultrasound in 5 years. This recommendation follows ACR consensus guidelines: White Paper of the ACR Incidental Findings Committee II on Vascular Findings. J Am Coll Radiol 2013; 10:789-794. 4. Additional incidental findings, as above. Electronically Signed   By: Vinnie Langton M.D.   On: 11/22/2016 14:15   CT results, lab results, note from yesterday reviewed   Procedures Procedures (including critical care time)  Medications Ordered in ED Medications - No data to display   Initial Impression / Assessment and Plan / ED Course  I have reviewed the triage vital signs and the nursing notes.  Pertinent labs & imaging results that were available during my care of the patient were reviewed by me and considered in my medical decision making (see chart for details).  On repeat exam the patient is in no distress per I discussed all findings with the patient's wife. With no evidence for new pathology, and after replacement of the Foley catheter, additional education, and provision of a extra bag, valve, the patient has no indication for additional imaging, admission. Patient will follow-up with urology as an outpatient.  Final Clinical Impressions(s) / ED Diagnoses  Hematuria   Carmin Muskrat, MD 11/23/16 2148

## 2016-11-23 NOTE — ED Triage Notes (Signed)
Seen here yesterday- given a leg bag  Now with hematuria  Leg bag without stopper

## 2016-11-24 LAB — URINE CULTURE: CULTURE: NO GROWTH

## 2016-12-03 ENCOUNTER — Inpatient Hospital Stay (HOSPITAL_COMMUNITY)
Admission: EM | Admit: 2016-12-03 | Discharge: 2016-12-05 | DRG: 698 | Disposition: A | Discharge status: Home Health | Payer: Medicare Other | Attending: Internal Medicine | Admitting: Internal Medicine

## 2016-12-03 ENCOUNTER — Emergency Department (HOSPITAL_COMMUNITY): Payer: Medicare Other

## 2016-12-03 ENCOUNTER — Encounter (HOSPITAL_COMMUNITY): Payer: Self-pay | Admitting: Emergency Medicine

## 2016-12-03 DIAGNOSIS — F419 Anxiety disorder, unspecified: Secondary | ICD-10-CM | POA: Diagnosis present

## 2016-12-03 DIAGNOSIS — Z955 Presence of coronary angioplasty implant and graft: Secondary | ICD-10-CM | POA: Diagnosis not present

## 2016-12-03 DIAGNOSIS — F0390 Unspecified dementia without behavioral disturbance: Secondary | ICD-10-CM | POA: Diagnosis present

## 2016-12-03 DIAGNOSIS — Z8249 Family history of ischemic heart disease and other diseases of the circulatory system: Secondary | ICD-10-CM

## 2016-12-03 DIAGNOSIS — W19XXXA Unspecified fall, initial encounter: Secondary | ICD-10-CM | POA: Diagnosis present

## 2016-12-03 DIAGNOSIS — Z820 Family history of epilepsy and other diseases of the nervous system: Secondary | ICD-10-CM

## 2016-12-03 DIAGNOSIS — I2510 Atherosclerotic heart disease of native coronary artery without angina pectoris: Secondary | ICD-10-CM | POA: Diagnosis present

## 2016-12-03 DIAGNOSIS — T83518A Infection and inflammatory reaction due to other urinary catheter, initial encounter: Secondary | ICD-10-CM | POA: Diagnosis not present

## 2016-12-03 DIAGNOSIS — Z85828 Personal history of other malignant neoplasm of skin: Secondary | ICD-10-CM | POA: Diagnosis not present

## 2016-12-03 DIAGNOSIS — E785 Hyperlipidemia, unspecified: Secondary | ICD-10-CM | POA: Diagnosis present

## 2016-12-03 DIAGNOSIS — R0602 Shortness of breath: Secondary | ICD-10-CM | POA: Diagnosis present

## 2016-12-03 DIAGNOSIS — Z96651 Presence of right artificial knee joint: Secondary | ICD-10-CM | POA: Diagnosis present

## 2016-12-03 DIAGNOSIS — I10 Essential (primary) hypertension: Secondary | ICD-10-CM | POA: Diagnosis present

## 2016-12-03 DIAGNOSIS — G4730 Sleep apnea, unspecified: Secondary | ICD-10-CM | POA: Diagnosis present

## 2016-12-03 DIAGNOSIS — F329 Major depressive disorder, single episode, unspecified: Secondary | ICD-10-CM | POA: Diagnosis present

## 2016-12-03 DIAGNOSIS — N400 Enlarged prostate without lower urinary tract symptoms: Secondary | ICD-10-CM | POA: Diagnosis present

## 2016-12-03 DIAGNOSIS — Y838 Other surgical procedures as the cause of abnormal reaction of the patient, or of later complication, without mention of misadventure at the time of the procedure: Secondary | ICD-10-CM | POA: Diagnosis present

## 2016-12-03 DIAGNOSIS — A419 Sepsis, unspecified organism: Secondary | ICD-10-CM | POA: Diagnosis present

## 2016-12-03 DIAGNOSIS — R509 Fever, unspecified: Secondary | ICD-10-CM

## 2016-12-03 DIAGNOSIS — G4700 Insomnia, unspecified: Secondary | ICD-10-CM | POA: Diagnosis present

## 2016-12-03 DIAGNOSIS — Z79899 Other long term (current) drug therapy: Secondary | ICD-10-CM | POA: Diagnosis not present

## 2016-12-03 DIAGNOSIS — K219 Gastro-esophageal reflux disease without esophagitis: Secondary | ICD-10-CM | POA: Diagnosis present

## 2016-12-03 DIAGNOSIS — J449 Chronic obstructive pulmonary disease, unspecified: Secondary | ICD-10-CM | POA: Diagnosis present

## 2016-12-03 DIAGNOSIS — Z7982 Long term (current) use of aspirin: Secondary | ICD-10-CM

## 2016-12-03 DIAGNOSIS — G92 Toxic encephalopathy: Secondary | ICD-10-CM | POA: Diagnosis present

## 2016-12-03 DIAGNOSIS — Z801 Family history of malignant neoplasm of trachea, bronchus and lung: Secondary | ICD-10-CM | POA: Diagnosis not present

## 2016-12-03 DIAGNOSIS — Z87891 Personal history of nicotine dependence: Secondary | ICD-10-CM

## 2016-12-03 DIAGNOSIS — Z823 Family history of stroke: Secondary | ICD-10-CM

## 2016-12-03 DIAGNOSIS — A4151 Sepsis due to Escherichia coli [E. coli]: Secondary | ICD-10-CM | POA: Diagnosis not present

## 2016-12-03 DIAGNOSIS — N390 Urinary tract infection, site not specified: Secondary | ICD-10-CM | POA: Diagnosis present

## 2016-12-03 DIAGNOSIS — R7302 Impaired glucose tolerance (oral): Secondary | ICD-10-CM | POA: Diagnosis present

## 2016-12-03 DIAGNOSIS — R000 Tachycardia, unspecified: Secondary | ICD-10-CM

## 2016-12-03 LAB — COMPREHENSIVE METABOLIC PANEL
ALBUMIN: 3.7 g/dL (ref 3.5–5.0)
ALT: 24 U/L (ref 17–63)
AST: 21 U/L (ref 15–41)
Alkaline Phosphatase: 60 U/L (ref 38–126)
Anion gap: 9 (ref 5–15)
BUN: 29 mg/dL — ABNORMAL HIGH (ref 6–20)
CALCIUM: 9.4 mg/dL (ref 8.9–10.3)
CO2: 24 mmol/L (ref 22–32)
CREATININE: 1.12 mg/dL (ref 0.61–1.24)
Chloride: 108 mmol/L (ref 101–111)
Glucose, Bld: 120 mg/dL — ABNORMAL HIGH (ref 65–99)
Potassium: 4 mmol/L (ref 3.5–5.1)
Sodium: 141 mmol/L (ref 135–145)
Total Bilirubin: 0.5 mg/dL (ref 0.3–1.2)
Total Protein: 7.5 g/dL (ref 6.5–8.1)

## 2016-12-03 LAB — URINALYSIS, ROUTINE W REFLEX MICROSCOPIC
Bilirubin Urine: NEGATIVE
GLUCOSE, UA: NEGATIVE mg/dL
KETONES UR: NEGATIVE mg/dL
Nitrite: NEGATIVE
PH: 5 (ref 5.0–8.0)
Protein, ur: 100 mg/dL — AB
SQUAMOUS EPITHELIAL / LPF: NONE SEEN
Specific Gravity, Urine: 1.026 (ref 1.005–1.030)

## 2016-12-03 LAB — LACTIC ACID, PLASMA: LACTIC ACID, VENOUS: 1 mmol/L (ref 0.5–1.9)

## 2016-12-03 LAB — RAPID URINE DRUG SCREEN, HOSP PERFORMED
AMPHETAMINES: NOT DETECTED
BARBITURATES: NOT DETECTED
BENZODIAZEPINES: POSITIVE — AB
Cocaine: NOT DETECTED
Opiates: NOT DETECTED
TETRAHYDROCANNABINOL: NOT DETECTED

## 2016-12-03 LAB — CBC
HCT: 47.2 % (ref 39.0–52.0)
Hemoglobin: 16 g/dL (ref 13.0–17.0)
MCH: 30 pg (ref 26.0–34.0)
MCHC: 33.9 g/dL (ref 30.0–36.0)
MCV: 88.6 fL (ref 78.0–100.0)
PLATELETS: 174 10*3/uL (ref 150–400)
RBC: 5.33 MIL/uL (ref 4.22–5.81)
RDW: 13.7 % (ref 11.5–15.5)
WBC: 7.1 10*3/uL (ref 4.0–10.5)

## 2016-12-03 LAB — TROPONIN I: Troponin I: <0.03 ng/mL (ref <0.03)

## 2016-12-03 LAB — TSH: TSH: 2.813 u[IU]/mL (ref 0.350–4.500)

## 2016-12-03 LAB — CBG MONITORING, ED: GLUCOSE-CAPILLARY: 104 mg/dL — AB (ref 65–99)

## 2016-12-03 MED ORDER — NITROGLYCERIN 0.4 MG SL SUBL: 0.4000 mg | SUBLINGUAL_TABLET | SUBLINGUAL | Status: DC | PRN

## 2016-12-03 MED ORDER — ACETAMINOPHEN 325 MG PO TABS
ORAL_TABLET | ORAL | Status: AC
  Filled 2016-12-03: qty 1

## 2016-12-03 MED ORDER — QUETIAPINE FUMARATE 100 MG PO TABS
400.0000 mg | ORAL_TABLET | Freq: Every day | ORAL | Status: DC
  Administered 2016-12-04: 400 mg via ORAL
  Filled 2016-12-03: qty 4

## 2016-12-03 MED ORDER — LORAZEPAM 2 MG/ML IJ SOLN
0.5000 mg | Freq: Once | INTRAMUSCULAR | Status: AC
  Administered 2016-12-03: 0.5 mg via INTRAVENOUS
  Filled 2016-12-03: qty 1

## 2016-12-03 MED ORDER — LORAZEPAM 1 MG PO TABS
2.0000 mg | ORAL_TABLET | Freq: Every day | ORAL | Status: DC
  Administered 2016-12-04: 2 mg via ORAL
  Filled 2016-12-03: qty 2

## 2016-12-03 MED ORDER — ASPIRIN 81 MG PO CHEW
81.0000 mg | CHEWABLE_TABLET | Freq: Every morning | ORAL | Status: DC
Start: 2016-12-04 — End: 2016-12-05
  Administered 2016-12-04 – 2016-12-05 (×2): 81 mg via ORAL
  Filled 2016-12-03 (×2): qty 1

## 2016-12-03 MED ORDER — RANOLAZINE ER 500 MG PO TB12
500.0000 mg | ORAL_TABLET | Freq: Two times a day (BID) | ORAL | Status: DC
  Administered 2016-12-04 – 2016-12-05 (×3): 500 mg via ORAL
  Filled 2016-12-03 (×4): qty 1

## 2016-12-03 MED ORDER — PANTOPRAZOLE SODIUM 40 MG PO TBEC
40.0000 mg | DELAYED_RELEASE_TABLET | Freq: Every day | ORAL | Status: DC
  Administered 2016-12-04 – 2016-12-05 (×2): 40 mg via ORAL
  Filled 2016-12-03 (×2): qty 1

## 2016-12-03 MED ORDER — FERROUS SULFATE 325 (65 FE) MG PO TABS
325.0000 mg | ORAL_TABLET | Freq: Every day | ORAL | Status: DC
  Administered 2016-12-04 – 2016-12-05 (×2): 325 mg via ORAL
  Filled 2016-12-03 (×2): qty 1

## 2016-12-03 MED ORDER — ACETAMINOPHEN 325 MG PO TABS
650.0000 mg | ORAL_TABLET | Freq: Once | ORAL | Status: AC
  Administered 2016-12-03: 650 mg via ORAL
  Filled 2016-12-03: qty 2

## 2016-12-03 MED ORDER — SODIUM CHLORIDE 0.9 % IV SOLN
INTRAVENOUS | Status: DC
  Administered 2016-12-03 – 2016-12-04 (×2): via INTRAVENOUS

## 2016-12-03 MED ORDER — DEXTROSE 5 % IV SOLN: 1.0000 g | INTRAVENOUS | Status: DC

## 2016-12-03 MED ORDER — FINASTERIDE 5 MG PO TABS
5.0000 mg | ORAL_TABLET | Freq: Every day | ORAL | Status: DC
  Administered 2016-12-04 – 2016-12-05 (×2): 5 mg via ORAL
  Filled 2016-12-03 (×5): qty 1

## 2016-12-03 MED ORDER — DONEPEZIL HCL 23 MG PO TABS
23.0000 mg | ORAL_TABLET | Freq: Every day | ORAL | Status: DC
  Administered 2016-12-04 – 2016-12-05 (×2): 23 mg via ORAL
  Filled 2016-12-03 (×4): qty 1

## 2016-12-03 MED ORDER — TAMSULOSIN HCL 0.4 MG PO CAPS
0.4000 mg | ORAL_CAPSULE | Freq: Every day | ORAL | Status: DC
  Administered 2016-12-04: 0.4 mg via ORAL
  Filled 2016-12-03: qty 1

## 2016-12-03 MED ORDER — ACETAMINOPHEN 650 MG RE SUPP: 650.0000 mg | Freq: Four times a day (QID) | RECTAL | Status: DC | PRN

## 2016-12-03 MED ORDER — MELOXICAM 7.5 MG PO TABS
7.5000 mg | ORAL_TABLET | Freq: Every day | ORAL | Status: DC | PRN
  Filled 2016-12-03: qty 1

## 2016-12-03 MED ORDER — ESCITALOPRAM OXALATE 10 MG PO TABS
20.0000 mg | ORAL_TABLET | Freq: Every morning | ORAL | Status: DC
  Administered 2016-12-04 – 2016-12-05 (×2): 20 mg via ORAL
  Filled 2016-12-03 (×2): qty 2

## 2016-12-03 MED ORDER — DEXTROSE 5 % IV SOLN
1.0000 g | INTRAVENOUS | Status: DC
  Administered 2016-12-04: 1 g via INTRAVENOUS
  Filled 2016-12-03 (×2): qty 10

## 2016-12-03 MED ORDER — ENOXAPARIN SODIUM 40 MG/0.4ML ~~LOC~~ SOLN
40.0000 mg | SUBCUTANEOUS | Status: DC
  Administered 2016-12-04: 40 mg via SUBCUTANEOUS
  Filled 2016-12-03 (×2): qty 0.4

## 2016-12-03 MED ORDER — QUETIAPINE FUMARATE 100 MG PO TABS
200.0000 mg | ORAL_TABLET | Freq: Two times a day (BID) | ORAL | Status: DC
Start: 2016-12-03 — End: 2016-12-03

## 2016-12-03 MED ORDER — DEXTROSE 5 % IV SOLN
1.0000 g | Freq: Once | INTRAVENOUS | Status: AC
  Administered 2016-12-03: 1 g via INTRAVENOUS
  Filled 2016-12-03: qty 10

## 2016-12-03 MED ORDER — SODIUM CHLORIDE 0.9 % IV SOLN: INTRAVENOUS | Status: DC

## 2016-12-03 MED ORDER — MELATONIN 5 MG PO CAPS: 2.0000 | ORAL_CAPSULE | Freq: Every day | ORAL | Status: DC

## 2016-12-03 MED ORDER — VITAMIN D 1000 UNITS PO TABS
1000.0000 [IU] | ORAL_TABLET | Freq: Every day | ORAL | Status: DC
  Administered 2016-12-04 – 2016-12-05 (×2): 1000 [IU] via ORAL
  Filled 2016-12-03 (×2): qty 1

## 2016-12-03 MED ORDER — SACCHAROMYCES BOULARDII 250 MG PO CAPS
250.0000 mg | ORAL_CAPSULE | Freq: Two times a day (BID) | ORAL | Status: DC
  Administered 2016-12-04 – 2016-12-05 (×3): 250 mg via ORAL
  Filled 2016-12-03 (×4): qty 1

## 2016-12-03 MED ORDER — MONTELUKAST SODIUM 10 MG PO TABS
10.0000 mg | ORAL_TABLET | Freq: Every morning | ORAL | Status: DC
  Administered 2016-12-04 – 2016-12-05 (×2): 10 mg via ORAL
  Filled 2016-12-03 (×2): qty 1

## 2016-12-03 MED ORDER — ACETAMINOPHEN 325 MG PO TABS
650.0000 mg | ORAL_TABLET | Freq: Four times a day (QID) | ORAL | Status: DC | PRN
  Administered 2016-12-04 (×2): 650 mg via ORAL
  Filled 2016-12-03 (×2): qty 2

## 2016-12-03 MED ORDER — QUETIAPINE FUMARATE 100 MG PO TABS
200.0000 mg | ORAL_TABLET | Freq: Every day | ORAL | Status: DC
  Administered 2016-12-04 – 2016-12-05 (×2): 200 mg via ORAL
  Filled 2016-12-03 (×2): qty 2

## 2016-12-03 NOTE — ED Provider Notes (Signed)
[facility] DEPT Provider Note   CSN: [id number] Arrival date & time: 12/03/16  1336  By signing my name below, I, Higinio Plan, attest that this documentation has been prepared under the direction and in the presence of Milton Ferguson, MD . Electronically Signed: Higinio Plan, Scribe. 12/03/2016. 2:04 PM.  History   Chief Complaint Chief Complaint  Patient presents with  . Altered Mental Status   The history is provided by the spouse. No language interpreter was used.  Altered Mental Status   This is a new problem. The current episode started 2 days ago. The problem has been gradually worsening. Associated symptoms include confusion and weakness. Pertinent negatives include no seizures. Risk factors include a recent infection. His past medical history does not include seizures.   HPI Comments: [name] [name] is a 76 y.o. male with PMHx of dementia, COPD, and CAD, who presents to the Emergency Department for an evaluation of altered mental status that began 2 days ago. Pt's wife reports pt visited the ED on 11/23/16 for gross hematuria and urinary retention in which he had a urinary catheter placed. She notes pt followed-up with a urologist 4 days ago in which he had his catheter removed and was prescribed Proscar. Per wife, pt hUrinary retention and had a urinary catheter inserted which was removed Proscar. Per wife, has been increasingly confused with associated weakness and fatigue since taking his Proscar. She notes pt also fell 2 days ago and was "completely out of it." Pt's wife reports pt has not been taking any pain medication recently and denies any other complaints.    Past Medical History:  Diagnosis Date  . Anxiety   . Carotid stenosis   . COPD (chronic obstructive pulmonary disease) ([place])   . Coronary atherosclerosis of native coronary artery    DES to LAD and circumflex 2006  . Dementia    Dr Bjorn Loser - Neurology  . Depression   . Essential hypertension, benign   . GERD  (gastroesophageal reflux disease)   . Glucose intolerance (impaired glucose tolerance)   . Hemorrhoids   . History of colon polyps   . Hyperlipidemia   . Insomnia   . Long-term memory loss    Aricept and Seroquel  . Nosebleed    09/15/12 - Dr Adriana Reams, ENT   . Osteoarthritis   . Skin cancer   . Sleep apnea   . SVT (supraventricular tachycardia) ([place])    Status post RFA    Patient Active Problem List   Diagnosis Date Noted  . Hyperlipidemia   . Dyspnea on exertion 01/31/2016  . Pneumothorax 07/13/2014  . Pulmonary infiltrates 07/08/2014  . History of PSVT (paroxysmal supraventricular tachycardia) 03/16/2013  . S/P right TKA 10/08/2012  . Bilateral carotid artery occlusion 12/04/2010  . HYPERLIPIDEMIA 11/10/2008  . HYPERTENSION, BENIGN 11/10/2008  . CAD, NATIVE VESSEL 11/10/2008    Past Surgical History:  Procedure Laterality Date  . BACK SURGERY    . Cataract surgery     Bilateral  . CERVICAL SPINE SURGERY  1978   x 2  . CIRCUMCISION     Age 81  . COLONOSCOPY    . EYE SURGERY    . FINGER SURGERY     Pointer finger on left hand  . [place]  . KNEE ARTHROSCOPY  04/03/2012   Procedure: ARTHROSCOPY KNEE;  Surgeon: Marin Shutter, MD;  Location: [facility];  Service: Orthopedics;  Laterality: Right;  Right Knee Arthroscopy with Debridement   .  Polyp removed from nose    . Thumb surgery     Bilateral  . TONSILLECTOMY AND ADENOIDECTOMY    . TOTAL KNEE ARTHROPLASTY Right 10/07/2012   Procedure: TOTAL KNEE ARTHROPLASTY;  Surgeon: Mauri Pole, MD;  Location: [facility];  Service: Orthopedics;  Laterality: Right;  Marland Kitchen VIDEO BRONCHOSCOPY Bilateral 07/13/2014   Procedure: VIDEO BRONCHOSCOPY WITH FLUORO;  Surgeon: Tanda Rockers, MD;  Location: [facility];  Service: Cardiopulmonary;  Laterality: Bilateral;    Home Medications    Prior to Admission medications   Medication Sig Start Date End Date Taking? Authorizing Provider  aspirin 81 MG tablet Take 81 mg by  mouth every morning.     Historical Provider, MD  cholecalciferol (VITAMIN D) 1000 units tablet Take 1,000 Units by mouth daily.    Historical Provider, MD  donepezil (ARICEPT) 23 MG TABS tablet Take 1 tablet by mouth daily. 08/05/15   Historical Provider, MD  escitalopram (LEXAPRO) 20 MG tablet Take 20 mg by mouth every morning.     Historical Provider, MD  Ferrous Sulfate (IRON) 325 (65 Fe) MG TABS Take 1 tablet by mouth daily.    Historical Provider, MD  ibuprofen (ADVIL,MOTRIN) 800 MG tablet Take 1 tablet by mouth as needed. 07/22/14   Historical Provider, MD  LORazepam (ATIVAN) 1 MG tablet Take 1-2 mg by mouth 2 (two) times daily. Take 1 tablet in the morning and 2 tablets at bedtime.    Historical Provider, MD  Melatonin 5 MG CAPS Take 2 capsules by mouth at bedtime.    Historical Provider, MD  meloxicam (MOBIC) 7.5 MG tablet Take 7.5 mg by mouth daily as needed for pain.    Historical Provider, MD  montelukast (SINGULAIR) 10 MG tablet Take 10 mg by mouth every morning.    Historical Provider, MD  NITROSTAT 0.4 MG SL tablet Place 0.4 mg under the tongue as needed. 07/20/14   Historical Provider, MD  pantoprazole (PROTONIX) 40 MG tablet Take 40 mg by mouth daily.     Historical Provider, MD  QUEtiapine (SEROQUEL) 200 MG tablet Take 200-400 mg by mouth 2 (two) times daily. Take 1 tablet in the morning and 2 tablets in the evening.    Historical Provider, MD  ranolazine (RANEXA) 500 MG 12 hr tablet Take 1 tablet (500 mg total) by mouth 2 (two) times daily. 08/23/15   Herminio Commons, MD  Tamsulosin HCl (FLOMAX) 0.4 MG CAPS Take 0.4 mg by mouth daily.     Historical Provider, MD    Family History Family History  Problem Relation Age of Onset  . Other Mother 56    Died old age  . Parkinsonism Mother   . Alzheimer's disease Father 64    Died  . Cancer Sister   . Arthritis Sister   . Heart disease Brother     Bypass surgery in his 57's  . Stroke Brother   . Lung cancer Sister      smoked    Social History Social History  Substance Use Topics  . Smoking status: Former Smoker    Packs/day: 1.50    Years: 35.00    Types: Cigarettes    Start date: 03/07/1959    Quit date: 08/07/1999  . Smokeless tobacco: Never Used  . Alcohol use No   Allergies   Patient has no known allergies.  Review of Systems Review of Systems  Constitutional: Positive for fatigue.  HENT: Negative for congestion, ear discharge and sinus pressure.   Eyes:  Negative for discharge.  Respiratory: Positive for cough.   Cardiovascular: Negative for chest pain.  Gastrointestinal: Negative for abdominal pain and diarrhea.  Genitourinary: Negative for frequency and hematuria.  Musculoskeletal: Negative for back pain.  Skin: Negative for rash.  Neurological: Positive for weakness. Negative for seizures and headaches.  Psychiatric/Behavioral: Positive for confusion.   Physical Exam Updated Vital Signs BP (!) 137/93 (BP Location: Left Arm)   Pulse 85   Temp 97.9 F (36.6 C) (Oral)   Resp (!) 22   Ht 5\' 8"  (1.727 m)   Wt 182 lb (82.6 kg)   SpO2 95%   BMI 27.67 kg/m   Physical Exam  Constitutional: He appears well-developed.  HENT:  Head: Normocephalic.  Eyes: Conjunctivae and EOM are normal. No scleral icterus.  Neck: Neck supple. No thyromegaly present.  Cardiovascular: Normal rate and regular rhythm.  Exam reveals no gallop and no friction rub.   No murmur heard. Pulmonary/Chest: No stridor. He has no wheezes. He has no rales. He exhibits no tenderness.  Abdominal: He exhibits no distension. There is no tenderness. There is no rebound.  Musculoskeletal: Normal range of motion. He exhibits no edema.  Lymphadenopathy:    He has no cervical adenopathy.  Neurological: He exhibits normal muscle tone.  Pinpoint pupils and generalized weakness all over   Skin: No rash noted. No erythema.  Psychiatric: He has a normal mood and affect. His behavior is normal.   ED Treatments / Results    DIAGNOSTIC STUDIES:  Oxygen Saturation is 95% on RA, adequate by my interpretation.    COORDINATION OF CARE:  2:01 PM Discussed treatment plan with pt at bedside and pt agreed to plan.  Labs (all labs ordered are listed, but only abnormal results are displayed) Labs Reviewed - No data to display  EKG  EKG Interpretation None       Radiology No results found.  Procedures Procedures (including critical care time)  Medications Ordered in ED Medications - No data to display  Initial Impression / Assessment and Plan / ED Course  I have reviewed the triage vital signs and the nursing notes.  Pertinent labs & imaging results that were available during my care of the patient were reviewed by me and considered in my medical decision making (see chart for details).      Patient with febrile illness and urinary tract infection. He will be admitted to medicine Final Clinical Impressions(s) / ED Diagnoses   Final diagnoses:  None    New Prescriptions New Prescriptions   No medications on file  The chart was scribed for me under my direct supervision.  I personally performed the history, physical, and medical decision making and all procedures in the evaluation of this patient.Milton Ferguson, MD 12/03/16 [phone]

## 2016-12-03 NOTE — H&P (Signed)
TRH H&P   Patient Demographics:    [name] [name], is a 76 y.o. male  MRN: [id number]   DOB - 1941/01/02  Admit Date - 12/03/2016  Outpatient Primary MD for the patient is Octavio Graves, DO  Referring MD/NP/PA: Pilar Jarvis  Outpatient Specialists:   Bronson Ing ? (cardiology)  Patient coming from: home  Chief Complaint  Patient presents with  . Altered Mental Status      HPI:    [name] [name]  is a 76 y.o. male, w Dementia, Copd, CAD who apparently presents for AMS that began 2 days ago.  p0er his wife, seen in ED for gross hematuria, and urinary retention and had catheter placed 4/20. Started on proscar.  Pt states that he has had more confusion over the past 2 days and apparently fell 2 days ago as well.  Pt is unable to provide history due to his dementia.   In ED,  CXR showed mild chronic bronchitis change, no pneumonia or CHF.  CT brain negative for acute intracranial abnormality.   UDS showed benzo positive.  UA=> wbc TNTC.  EKG nsr at 80, LAD, early R progression no st -t changes c/w ischemia.  Trop negative.  Pt will be admitted for  AMS secondary to UTI.     Review of systems:    In addition to the HPI above,  Unable to obtain ROS due to dementia and AMS   With Past History of the following :    Past Medical History:  Diagnosis Date  . Anxiety   . Carotid stenosis   . COPD (chronic obstructive pulmonary disease) ([place])   . Coronary atherosclerosis of native coronary artery    DES to LAD and circumflex 2006  . Dementia    Dr Bjorn Loser - Neurology  . Depression   . Essential hypertension, benign   . GERD (gastroesophageal reflux disease)   . Glucose intolerance (impaired glucose tolerance)   . Hemorrhoids   . History of colon polyps   . Hyperlipidemia   . Insomnia   . Long-term memory loss    Aricept and Seroquel  . Nosebleed    09/15/12 - Dr Adriana Reams,  ENT   . Osteoarthritis   . Skin cancer   . Sleep apnea   . SVT (supraventricular tachycardia) ([facility])    Status post RFA      Past Surgical History:  Procedure Laterality Date  . BACK SURGERY    . Cataract surgery     Bilateral  . CERVICAL SPINE SURGERY  1978   x 2  . CIRCUMCISION     Age 89  . COLONOSCOPY    . EYE SURGERY    . FINGER SURGERY     Pointer finger on left hand  . [place]  . KNEE ARTHROSCOPY  04/03/2012   Procedure: ARTHROSCOPY KNEE;  Surgeon: Marin Shutter, MD;  Location: [facility];  Service: Orthopedics;  Laterality: Right;  Right Knee Arthroscopy with Debridement   . Polyp removed from nose    . Thumb surgery     Bilateral  . TONSILLECTOMY AND ADENOIDECTOMY    . TOTAL KNEE ARTHROPLASTY Right 10/07/2012   Procedure: TOTAL KNEE ARTHROPLASTY;  Surgeon: Mauri Pole, MD;  Location: [facility];  Service: Orthopedics;  Laterality: Right;  Marland Kitchen VIDEO BRONCHOSCOPY Bilateral 07/13/2014   Procedure: VIDEO BRONCHOSCOPY WITH FLUORO;  Surgeon: Tanda Rockers, MD;  Location: [facility];  Service: Cardiopulmonary;  Laterality: Bilateral;      Social History:     Social History  Substance Use Topics  . Smoking status: Former Smoker    Packs/day: 1.50    Years: 35.00    Types: Cigarettes    Start date: 03/07/1959    Quit date: 08/07/1999  . Smokeless tobacco: Never Used  . Alcohol use No     Lives - at home with wife     Family History :     Family History  Problem Relation Age of Onset  . Other Mother 6    Died old age  . Parkinsonism Mother   . Alzheimer's disease Father 56    Died  . Cancer Sister   . Arthritis Sister   . Heart disease Brother     Bypass surgery in his 11's  . Stroke Brother   . Lung cancer Sister     smoked      Home Medications:   Prior to Admission medications   Medication Sig Start Date End Date Taking? Authorizing Provider  aspirin 81 MG tablet Take 81 mg by mouth every morning.    Yes Historical Provider, MD    cholecalciferol (VITAMIN D) 1000 units tablet Take 1,000 Units by mouth daily.   Yes Historical Provider, MD  donepezil (ARICEPT) 23 MG TABS tablet Take 1 tablet by mouth daily. 08/05/15  Yes Historical Provider, MD  escitalopram (LEXAPRO) 20 MG tablet Take 20 mg by mouth every morning.    Yes Historical Provider, MD  Ferrous Sulfate (IRON) 325 (65 Fe) MG TABS Take 1 tablet by mouth daily.   Yes Historical Provider, MD  finasteride (PROSCAR) 5 MG tablet Take 1 tablet by mouth daily. 11/29/16  Yes Historical Provider, MD  ibuprofen (ADVIL,MOTRIN) 800 MG tablet Take 1 tablet by mouth as needed. 07/22/14  Yes Historical Provider, MD  LORazepam (ATIVAN) 2 MG tablet Take 2 mg by mouth at bedtime. Take 1 tablet in the morning and 2 tablets at bedtime.   Yes Historical Provider, MD  Melatonin 5 MG CAPS Take 2 capsules by mouth at bedtime.   Yes Historical Provider, MD  meloxicam (MOBIC) 7.5 MG tablet Take 7.5 mg by mouth daily as needed for pain.   Yes Historical Provider, MD  montelukast (SINGULAIR) 10 MG tablet Take 10 mg by mouth every morning.   Yes Historical Provider, MD  NITROSTAT 0.4 MG SL tablet Place 0.4 mg under the tongue as needed. 07/20/14  Yes Historical Provider, MD  pantoprazole (PROTONIX) 40 MG tablet Take 40 mg by mouth daily.    Yes Historical Provider, MD  QUEtiapine (SEROQUEL) 200 MG tablet Take 200-400 mg by mouth 2 (two) times daily. Take 1 tablet in the morning and 2 tablets in the evening.   Yes Historical Provider, MD  ranolazine (RANEXA) 500 MG 12 hr tablet Take 1 tablet (500 mg total) by mouth 2 (two) times daily. 08/23/15  Yes Jamesetta So A  Bronson Ing, MD  Tamsulosin HCl (FLOMAX) 0.4 MG CAPS Take 0.4 mg by mouth daily.    Yes Historical Provider, MD     Allergies:    No Known Allergies   Physical Exam:   Vitals  Blood pressure 139/83, pulse (!) 105, temperature 99.4 F (37.4 C), temperature source Oral, resp. rate (!) 24, height 5\' 8"  (1.727 m), weight 78.1 kg (172 lb 2.9  oz), SpO2 95 %.   1. General lying in bed in NAD,   2. Normal affect and insight, Not Suicidal or Homicidal, Awake Alert, Oriented X 1  3. No F.N deficits, ALL C.Nerves Intact, Strength 5/5 all 4 extremities, Sensation intact all 4 extremities, Plantars down going.  4. Ears and Eyes appear Normal, Conjunctivae clear, PERRLA. Moist Oral Mucosa.  5. Supple Neck, No JVD, No cervical lymphadenopathy appriciated, No Carotid Bruits.  6. Symmetrical Chest wall movement, Good air movement bilaterally, CTAB.  7. RRR, No Gallops, Rubs or Murmurs, No Parasternal Heave.  8. Positive Bowel Sounds, Abdomen Soft, No tenderness, No organomegaly appriciated,No rebound -guarding or rigidity.  9.  No Cyanosis, Normal Skin Turgor, No Skin Rash or Bruise.  10. Good muscle tone,  joints appear normal , no effusions, Normal ROM.  11. No Palpable Lymph Nodes in Neck or Axillae     Data Review:    CBC  Recent Labs Lab 12/03/16 1350  WBC 7.1  HGB 16.0  HCT 47.2  PLT 174  MCV 88.6  MCH 30.0  MCHC 33.9  RDW 13.7   ------------------------------------------------------------------------------------------------------------------  Chemistries   Recent Labs Lab 12/03/16 1350  NA 141  K 4.0  CL 108  CO2 24  GLUCOSE 120*  BUN 29*  CREATININE 1.12  CALCIUM 9.4  AST 21  ALT 24  ALKPHOS 60  BILITOT 0.5   ------------------------------------------------------------------------------------------------------------------ estimated creatinine clearance is 56.8 mL/min (by C-G formula based on SCr of 1.12 mg/dL). ------------------------------------------------------------------------------------------------------------------ No results for input(s): TSH, T4TOTAL, T3FREE, THYROIDAB in the last 72 hours.  Invalid input(s): FREET3  Coagulation profile No results for input(s): INR, PROTIME in the last 168  hours. ------------------------------------------------------------------------------------------------------------------- No results for input(s): DDIMER in the last 72 hours. -------------------------------------------------------------------------------------------------------------------  Cardiac Enzymes  Recent Labs Lab 12/03/16 1733  TROPONINI <0.03   ------------------------------------------------------------------------------------------------------------------ No results found for: BNP   ---------------------------------------------------------------------------------------------------------------  Urinalysis    Component Value Date/Time   COLORURINE AMBER (A) 12/03/2016 1500   APPEARANCEUR HAZY (A) 12/03/2016 1500   LABSPEC 1.026 12/03/2016 1500   PHURINE 5.0 12/03/2016 1500   GLUCOSEU NEGATIVE 12/03/2016 1500   HGBUR LARGE (A) 12/03/2016 1500   BILIRUBINUR NEGATIVE 12/03/2016 1500   KETONESUR NEGATIVE 12/03/2016 1500   PROTEINUR 100 (A) 12/03/2016 1500   UROBILINOGEN 0.2 09/30/2012 1020   NITRITE NEGATIVE 12/03/2016 1500   LEUKOCYTESUR SMALL (A) 12/03/2016 1500    ----------------------------------------------------------------------------------------------------------------   Imaging Results:    Dg Chest 2 View  Result Date: 12/03/2016 CLINICAL DATA:  Cough, COPD, former smoker, patient unable to provide history due to dementia. EXAM: CHEST  2 VIEW COMPARISON:  PA and lateral chest x-ray of November 12, 2016 FINDINGS: The lungs are reasonably well inflated. There is no alveolar infiltrate or pleural effusion. The cardiac silhouette is top-normal in size. The pulmonary vascularity is normal. There is calcification in the wall of the thoracic aorta. The bony thorax exhibits no acute abnormality. There is an old healed midshaft right clavicular fracture. IMPRESSION: Mild chronic bronchitic changes.  No pneumonia nor CHF. Thoracic aortic atherosclerosis. Electronically  Signed  By: Romelle  Martinique M.D.   On: 12/03/2016 15:52   Ct Head Wo Contrast  Result Date: 12/03/2016 CLINICAL DATA:  Confusion and decreased appetite. EXAM: CT HEAD WITHOUT CONTRAST TECHNIQUE: Contiguous axial images were obtained from the base of the skull through the vertex without intravenous contrast. COMPARISON:  Sinus CT 11/08/2014 and 09/03/2012. FINDINGS: Brain: There is no evidence for acute hemorrhage, hydrocephalus, mass lesion, or abnormal extra-axial fluid collection. No definite CT evidence for acute infarction. Diffuse loss of parenchymal volume is consistent with atrophy. Patchy low attenuation in the deep hemispheric and periventricular white matter is nonspecific, but likely reflects chronic microvascular ischemic demyelination. Tiny cortical calcification posterior right parieto-occipital region with benign features and may be related to prior infection or inflammation. Tiny vascular malformation also a consideration. Vascular: No hyperdense vessel or unexpected calcification. Skull: No evidence for fracture. No worrisome lytic or sclerotic lesion. Sinuses/Orbits: The visualized paranasal sinuses and mastoid air cells are clear. Visualized portions of the globes and intraorbital fat are unremarkable. Other: None. IMPRESSION: 1. No acute intracranial abnormality. 2. Atrophy with chronic small vessel white matter ischemic disease. Electronically Signed   By: Misty Stanley M.D.   On: 12/03/2016 15:46   Dg Chest Port 1v Same Day  Result Date: 12/03/2016 CLINICAL DATA:  76 year old presenting with acute onset of fever and shortness of breath that began earlier today. EXAM: PORTABLE CHEST 1 VIEW 4:26 p.m.: COMPARISON:  Two-view chest x-ray earlier today 3:31 p.m., 11/12/2016 and earlier. CT chest 04/10/2015. FINDINGS: Cardiac silhouette upper normal in size to slightly enlarged for AP portable technique, unchanged. Mild pulmonary venous hypertension without overt edema. Lungs clear. No visible  pleural effusions. IMPRESSION: No acute cardiopulmonary disease. Electronically Signed   By: Evangeline Dakin M.D.   On: 12/03/2016 16:42       Assessment & Plan:    Active Problems:   Sepsis ([place])    1. Possible early sepsis secondary to Uti, ,(meets sepsis criteria by being febrile, tachycardic, tachypneic) Urine culture, blood culture pending Start rocephin 1gm iv qday Start florastor  2. Tachycardia Trop I q6h x3 If persistent after hydration consider echo, and tsh  3. Dementia Continue Aricept, Lexapro, Seroquel, lorazepam  4. Bph Cont finasteride, cont flomax  5. Gerd Cont protonix   DVT Prophylaxis Heparin -  Lovenox - SCDs   AM Labs Ordered, also please review Full Orders  Family Communication: Admission, patients condition and plan of care including tests being ordered have been discussed with the patient and wife  who indicate understanding and agree with the plan and Code Status.  Code Status FULL CODE  Likely DC to  home  Condition GUARDED    Consults called: none  Admission status: inpatient  Time spent in minutes : 45 minutes   Jani Gravel M.D on 12/03/2016 at 7:20 PM  Between 7am to 7pm - Pager - [phone]. After 7pm go to [email or web address] - password [facility]  [facility] - Office  [phone]

## 2016-12-03 NOTE — Progress Notes (Signed)

## 2016-12-03 NOTE — ED Triage Notes (Signed)
Patient's wife states patient has had confusion and poor intake since Saturday. States he has history of alzheimer's and cut the grass on Friday but has been different since Saturday. States he is unable to perform ADL's on his own. States he had urinary catheter inserted and removed recently.

## 2016-12-03 NOTE — ED Notes (Signed)
Pt's wife called for RN. RN went to room to assess pt and he was shaking all over and not responding as well as before. Pt had increased respirations of 26, tachycardia of up to 115, oral temp 98.6, wheezing. MD notified and came to assess pt. Order given for Ativan and additional chest xray at this time.

## 2016-12-04 LAB — COMPREHENSIVE METABOLIC PANEL
ALK PHOS: 49 U/L (ref 38–126)
ALT: 23 U/L (ref 17–63)
AST: 18 U/L (ref 15–41)
Albumin: 3.2 g/dL — ABNORMAL LOW (ref 3.5–5.0)
Anion gap: 8 (ref 5–15)
BUN: 30 mg/dL — ABNORMAL HIGH (ref 6–20)
CALCIUM: 8.7 mg/dL — AB (ref 8.9–10.3)
CO2: 26 mmol/L (ref 22–32)
CREATININE: 1.29 mg/dL — AB (ref 0.61–1.24)
Chloride: 107 mmol/L (ref 101–111)
GFR, EST NON AFRICAN AMERICAN: 53 mL/min — AB (ref >60)
Glucose, Bld: 113 mg/dL — ABNORMAL HIGH (ref 65–99)
Potassium: 3.7 mmol/L (ref 3.5–5.1)
SODIUM: 141 mmol/L (ref 135–145)
Total Bilirubin: 0.2 mg/dL — ABNORMAL LOW (ref 0.3–1.2)
Total Protein: 6.5 g/dL (ref 6.5–8.1)

## 2016-12-04 LAB — CBC
HCT: 43.4 % (ref 39.0–52.0)
Hemoglobin: 14.2 g/dL (ref 13.0–17.0)
MCH: 29.2 pg (ref 26.0–34.0)
MCHC: 32.7 g/dL (ref 30.0–36.0)
MCV: 89.3 fL (ref 78.0–100.0)
PLATELETS: 170 10*3/uL (ref 150–400)
RBC: 4.86 MIL/uL (ref 4.22–5.81)
RDW: 13.8 % (ref 11.5–15.5)
WBC: 7.1 10*3/uL (ref 4.0–10.5)

## 2016-12-04 LAB — TROPONIN I
Troponin I: <0.03 ng/mL (ref <0.03)
Troponin I: <0.03 ng/mL (ref <0.03)

## 2016-12-04 MED ORDER — POLYETHYLENE GLYCOL 3350 17 G PO PACK
17.0000 g | PACK | Freq: Every day | ORAL | Status: DC
  Administered 2016-12-04 – 2016-12-05 (×2): 17 g via ORAL
  Filled 2016-12-04 (×2): qty 1

## 2016-12-04 MED ORDER — TAMSULOSIN HCL 0.4 MG PO CAPS
0.4000 mg | ORAL_CAPSULE | Freq: Two times a day (BID) | ORAL | Status: DC
  Administered 2016-12-04 – 2016-12-05 (×2): 0.4 mg via ORAL
  Filled 2016-12-04 (×2): qty 1

## 2016-12-04 MED ORDER — SODIUM CHLORIDE 0.9 % IV SOLN
INTRAVENOUS | Status: DC
  Administered 2016-12-04 – 2016-12-05 (×2): via INTRAVENOUS

## 2016-12-04 NOTE — Progress Notes (Signed)
PROGRESS NOTE    [name] [name]  WJX:[id number] DOB: 10-02-1940 DOA: 12/03/2016 PCP: Octavio Graves, DO    Brief Narrative: [name] [name]  is a 76 y.o. male, w Dementia, Copd, CAD who apparently presents for AMS that began 2 days ago.  per his wife, patient was seen in ED for gross hematuria, and urinary retention and had catheter placed 4/20. Started on proscar.  Pt states that he has had more confusion over the past 2 days and apparently fell 2 days ago as well.  Pt is unable to provide history due to his dementia.   In ED,  CXR showed mild chronic bronchitis change, no pneumonia or CHF.  CT brain negative for acute intracranial abnormality.   UDS showed benzo positive.  UA=> wbc TNTC.  EKG nsr at 80, LAD, early R progression no st -t changes c/w ischemia.  Trop negative.  Pt will be admitted for  AMS secondary to UTI.    Assessment & Plan:   Active Problems:   Sepsis ([place])   Urinary tract infection without hematuria   Fever, unspecified   Tachycardia  1-UTI; UA with too numerous to count WBC. Patient presents with fever.  Recent history of urine retention. Will need to monitor urine out put, check bladder scan.  Continue with ceftriaxone.  Follow urine culture.   2-BPH; history of urine retention.  He was evaluated at urology office last week and he had voiding trial. Foley catheter was removed.  He was started on Proscar and Flomax was increase to BID. (confirm increase dose of flomax with PA, Diana ward.) Monitor for urine retention.   3-Sepsis:  Presents with fever, tachycardia, tachypnea. Source of infection UTI.  Continue with IV fluids, and IV antibiotics.   4-Acute encephalopathy; toxic, related to infection  Presents with AMS, lethargic.  Related to infection.  Improved.   5-Dementia Continue Aricept, Lexapro, Seroquel, lorazepam   DVT prophylaxis: Lovenox.  Code Status: full code.   Family Communication: care discussed with wife.  Disposition Plan: home in 24  to 48 hours.   Consultants:   none   Procedures:   none   Antimicrobials:   Ceftriaxone    Subjective: patient alert, with improved mental status.  Wife at bedside. Patient was not doing well since Saturday, he has been weak, he fell down on Saturday.  He saw PA at [facility]rology , and had voiding trial and catheter was removed.  He has been emptying his bladder.    Objective: Vitals:   12/03/16 1800 12/03/16 1844 12/03/16 2300 12/04/16 0639  BP: 132/84 139/83 128/79 120/74  Pulse: (!) 122 (!) 105 (!) 102 (!) 101  Resp: (!) 26 (!) 24 (!) 22 20  Temp:  99.4 F (37.4 C) 98.3 F (36.8 C) 98.6 F (37 C)  TempSrc:  Oral Rectal Oral  SpO2: 90% 95% 95% 95%  Weight:  78.1 kg (172 lb 2.9 oz)    Height:  5\' 8"  (1.727 m)      Intake/Output Summary (Last 24 hours) at 12/04/16 1139 Last data filed at 12/04/16 0336  Gross per 24 hour  Intake              515 ml  Output              500 ml  Net               15 ml   Filed Weights   12/03/16 1346 12/03/16 1844  Weight: 82.6 kg (  182 lb) 78.1 kg (172 lb 2.9 oz)    Examination:  General exam: Appears calm and comfortable  Respiratory system: Clear to auscultation. Respiratory effort normal. Cardiovascular system: S1 & S2 heard, RRR. No JVD, murmurs, rubs, gallops or clicks. No pedal edema. Gastrointestinal system: Abdomen is nondistended, soft and nontender. No organomegaly or masses felt. Normal bowel sounds heard. Central nervous system: Alert and oriented. No focal neurological deficits. Extremities: Symmetric 5 x 5 power. Skin: No rashes, lesions or ulcers Psychiatry: Judgement and insight appear normal. Mood & affect appropriate.     Data Reviewed: I have personally reviewed following labs and imaging studies  CBC:  Recent Labs Lab 12/03/16 1350 12/04/16 0501  WBC 7.1 7.1  HGB 16.0 14.2  HCT 47.2 43.4  MCV 88.6 89.3  PLT 174 703   Basic Metabolic Panel:  Recent Labs Lab 12/03/16 1350  12/04/16 0501  NA 141 141  K 4.0 3.7  CL 108 107  CO2 24 26  GLUCOSE 120* 113*  BUN 29* 30*  CREATININE 1.12 1.29*  CALCIUM 9.4 8.7*   GFR: Estimated Creatinine Clearance: 49.3 mL/min (A) (by C-G formula based on SCr of 1.29 mg/dL (H)). Liver Function Tests:  Recent Labs Lab 12/03/16 1350 12/04/16 0501  AST 21 18  ALT 24 23  ALKPHOS 60 49  BILITOT 0.5 0.2*  PROT 7.5 6.5  ALBUMIN 3.7 3.2*   No results for input(s): LIPASE, AMYLASE in the last 168 hours. No results for input(s): AMMONIA in the last 168 hours. Coagulation Profile: No results for input(s): INR, PROTIME in the last 168 hours. Cardiac Enzymes:  Recent Labs Lab 12/03/16 1733 12/03/16 2317 12/04/16 0501  TROPONINI <0.03 <0.03 <0.03   BNP (last 3 results)  Recent Labs  01/31/16 1411 11/12/16 1408  PROBNP 25.0 20.0   HbA1C: No results for input(s): HGBA1C in the last 72 hours. CBG:  Recent Labs Lab 12/03/16 1404  GLUCAP 104*   Lipid Profile: No results for input(s): CHOL, HDL, LDLCALC, TRIG, CHOLHDL, LDLDIRECT in the last 72 hours. Thyroid Function Tests:  Recent Labs  12/03/16 1943  TSH 2.813   Anemia Panel: No results for input(s): VITAMINB12, FOLATE, FERRITIN, TIBC, IRON, RETICCTPCT in the last 72 hours. Sepsis Labs:  Recent Labs Lab 12/03/16 1412  LATICACIDVEN 1.0    No results found for this or any previous visit (from the past 240 hour(s)).       Radiology Studies: Dg Chest 2 View  Result Date: 12/03/2016 CLINICAL DATA:  Cough, COPD, former smoker, patient unable to provide history due to dementia. EXAM: CHEST  2 VIEW COMPARISON:  PA and lateral chest x-ray of November 12, 2016 FINDINGS: The lungs are reasonably well inflated. There is no alveolar infiltrate or pleural effusion. The cardiac silhouette is top-normal in size. The pulmonary vascularity is normal. There is calcification in the wall of the thoracic aorta. The bony thorax exhibits no acute abnormality. There is an  old healed midshaft right clavicular fracture. IMPRESSION: Mild chronic bronchitic changes.  No pneumonia nor CHF. Thoracic aortic atherosclerosis. Electronically Signed   By: Jakori  Martinique M.D.   On: 12/03/2016 15:52   Ct Head Wo Contrast  Result Date: 12/03/2016 CLINICAL DATA:  Confusion and decreased appetite. EXAM: CT HEAD WITHOUT CONTRAST TECHNIQUE: Contiguous axial images were obtained from the base of the skull through the vertex without intravenous contrast. COMPARISON:  Sinus CT 11/08/2014 and 09/03/2012. FINDINGS: Brain: There is no evidence for acute hemorrhage, hydrocephalus, mass lesion, or abnormal  extra-axial fluid collection. No definite CT evidence for acute infarction. Diffuse loss of parenchymal volume is consistent with atrophy. Patchy low attenuation in the deep hemispheric and periventricular white matter is nonspecific, but likely reflects chronic microvascular ischemic demyelination. Tiny cortical calcification posterior right parieto-occipital region with benign features and may be related to prior infection or inflammation. Tiny vascular malformation also a consideration. Vascular: No hyperdense vessel or unexpected calcification. Skull: No evidence for fracture. No worrisome lytic or sclerotic lesion. Sinuses/Orbits: The visualized paranasal sinuses and mastoid air cells are clear. Visualized portions of the globes and intraorbital fat are unremarkable. Other: None. IMPRESSION: 1. No acute intracranial abnormality. 2. Atrophy with chronic small vessel white matter ischemic disease. Electronically Signed   By: Misty Stanley M.D.   On: 12/03/2016 15:46   Dg Chest Port 1v Same Day  Result Date: 12/03/2016 CLINICAL DATA:  76 year old presenting with acute onset of fever and shortness of breath that began earlier today. EXAM: PORTABLE CHEST 1 VIEW 4:26 p.m.: COMPARISON:  Two-view chest x-ray earlier today 3:31 p.m., 11/12/2016 and earlier. CT chest 04/10/2015. FINDINGS: Cardiac  silhouette upper normal in size to slightly enlarged for AP portable technique, unchanged. Mild pulmonary venous hypertension without overt edema. Lungs clear. No visible pleural effusions. IMPRESSION: No acute cardiopulmonary disease. Electronically Signed   By: Evangeline Dakin M.D.   On: 12/03/2016 16:42        Scheduled Meds: . aspirin  81 mg Oral q morning - 10a  . cholecalciferol  1,000 Units Oral Daily  . donepezil  23 mg Oral Daily  . enoxaparin (LOVENOX) injection  40 mg Subcutaneous Q24H  . escitalopram  20 mg Oral q morning - 10a  . ferrous sulfate  325 mg Oral Daily  . finasteride  5 mg Oral Daily  . LORazepam  2 mg Oral QHS  . montelukast  10 mg Oral q morning - 10a  . pantoprazole  40 mg Oral Daily  . QUEtiapine  200 mg Oral Daily   And  . QUEtiapine  400 mg Oral QHS  . ranolazine  500 mg Oral BID  . saccharomyces boulardii  250 mg Oral BID  . tamsulosin  0.4 mg Oral Daily   Continuous Infusions: . sodium chloride    . sodium chloride 75 mL/hr at 12/04/16 0644  . cefTRIAXone (ROCEPHIN)  IV       LOS: 1 day    Time spent: 35 minutes.     Elmarie Shiley, MD [facility] Pager [phone]  If 7PM-7AM, please contact night-coverage [email or web address] Password [email or web address] 12/04/2016, 11:39 AM

## 2016-12-05 DIAGNOSIS — A4151 Sepsis due to Escherichia coli [E. coli]: Secondary | ICD-10-CM

## 2016-12-05 DIAGNOSIS — N390 Urinary tract infection, site not specified: Secondary | ICD-10-CM

## 2016-12-05 LAB — BASIC METABOLIC PANEL
ANION GAP: 7 (ref 5–15)
BUN: 19 mg/dL (ref 6–20)
CALCIUM: 8.4 mg/dL — AB (ref 8.9–10.3)
CO2: 26 mmol/L (ref 22–32)
CREATININE: 1.03 mg/dL (ref 0.61–1.24)
Chloride: 106 mmol/L (ref 101–111)
GLUCOSE: 93 mg/dL (ref 65–99)
Potassium: 3.5 mmol/L (ref 3.5–5.1)
Sodium: 139 mmol/L (ref 135–145)

## 2016-12-05 MED ORDER — CIPROFLOXACIN HCL 500 MG PO TABS: 500.0000 mg | ORAL_TABLET | Freq: Two times a day (BID) | ORAL | 0 refills | Status: DC

## 2016-12-05 NOTE — Discharge Summary (Signed)
Physician Discharge Summary  [name] [name] LEX:[id number] DOB: October 05, 1940 DOA: 12/03/2016  PCP: Octavio Graves, DO  Admit date: 12/03/2016 Discharge date: 12/05/2016  Time spent: 45 minutes  Recommendations for Outpatient Follow-up:  -Will be discharged home today. -Advised to follow up with urologist as scheduled for May 15th.   Discharge Diagnoses:  Active Problems:   Sepsis ([place])   Urinary tract infection without hematuria   Fever, unspecified   Tachycardia   Discharge Condition: Stable and improved  Filed Weights   12/03/16 1346 12/03/16 1844 12/05/16 0500  Weight: 82.6 kg (182 lb) 78.1 kg (172 lb 2.9 oz) 83.1 kg (183 lb 3.2 oz)    History of present illness:  As per Dr. Maudie Mercury on 4/30: [name] [name]  is a 76 y.o. male, w Dementia, Copd, CAD who apparently presents for AMS that began 2 days ago.  p0er his wife, seen in ED for gross hematuria, and urinary retention and had catheter placed 4/20. Started on proscar.  Pt states that he has had more confusion over the past 2 days and apparently fell 2 days ago as well.  Pt is unable to provide history due to his dementia.   In ED,  CXR showed mild chronic bronchitis change, no pneumonia or CHF.  CT brain negative for acute intracranial abnormality.   UDS showed benzo positive.  UA=> wbc TNTC.  EKG nsr at 80, LAD, early R progression no st -t changes c/w ischemia.  Trop negative.  Pt will be admitted for  AMS secondary to UTI.    Hospital Course:   Sepsis due to UTI secondary to indwelling foley catheter -Sepsis parameters resolved. -Urine cx with >100,000 GNR. Will Dc on 5 days of cipro to complete 8 days of treatment. -Has follow up already scheduled with urology for later this month.  BPH/Urinary Retention -Continue proscar. -Leave foley in for now until follows up with GU, as he again had urinary retention.  Acute Metabolic Encephalopathy -Due to infection on top of baseline dementia. -As per wife, improved but still  not at baseline.  Procedures:  None   Consultations:  None  Discharge Instructions  Discharge Instructions    Diet - low sodium heart healthy    Complete by:  As directed    Increase activity slowly    Complete by:  As directed      Allergies as of 12/05/2016   No Known Allergies     Medication List    STOP taking these medications   ibuprofen 800 MG tablet Commonly known as:  ADVIL,MOTRIN     TAKE these medications   aspirin 81 MG tablet Take 81 mg by mouth every morning.   cholecalciferol 1000 units tablet Commonly known as:  VITAMIN D Take 1,000 Units by mouth daily.   ciprofloxacin 500 MG tablet Commonly known as:  CIPRO Take 1 tablet (500 mg total) by mouth 2 (two) times daily.   donepezil 23 MG Tabs tablet Commonly known as:  ARICEPT Take 1 tablet by mouth daily.   escitalopram 20 MG tablet Commonly known as:  LEXAPRO Take 20 mg by mouth every morning.   finasteride 5 MG tablet Commonly known as:  PROSCAR Take 1 tablet by mouth daily.   Iron 325 (65 Fe) MG Tabs Take 1 tablet by mouth daily.   LORazepam 2 MG tablet Commonly known as:  ATIVAN Take 2 mg by mouth at bedtime. Take 1 tablet in the morning and 2 tablets at bedtime.  Melatonin 5 MG Caps Take 2 capsules by mouth at bedtime.   meloxicam 7.5 MG tablet Commonly known as:  MOBIC Take 7.5 mg by mouth daily as needed for pain.   montelukast 10 MG tablet Commonly known as:  SINGULAIR Take 10 mg by mouth every morning.   NITROSTAT 0.4 MG SL tablet Generic drug:  nitroGLYCERIN Place 0.4 mg under the tongue as needed.   pantoprazole 40 MG tablet Commonly known as:  PROTONIX Take 40 mg by mouth daily.   QUEtiapine 200 MG tablet Commonly known as:  SEROQUEL Take 200-400 mg by mouth 2 (two) times daily. Take 1 tablet in the morning and 2 tablets in the evening.   ranolazine 500 MG 12 hr tablet Commonly known as:  RANEXA Take 1 tablet (500 mg total) by mouth 2 (two) times daily.     tamsulosin 0.4 MG Caps capsule Commonly known as:  FLOMAX Take 0.4 mg by mouth daily.      No Known Allergies Follow-up Information    CYNTHIA BUTLER, DO. Schedule an appointment as soon as possible for a visit in 2 week(s).   Contact information: [address] [place] [address] [facility].   Contact information: [address] [place] [address] [phone]            The results of significant diagnostics from this hospitalization (including imaging, microbiology, ancillary and laboratory) are listed below for reference.    Significant Diagnostic Studies: Dg Chest 2 View  Result Date: 12/03/2016 CLINICAL DATA:  Cough, COPD, former smoker, patient unable to provide history due to dementia. EXAM: CHEST  2 VIEW COMPARISON:  PA and lateral chest x-ray of November 12, 2016 FINDINGS: The lungs are reasonably well inflated. There is no alveolar infiltrate or pleural effusion. The cardiac silhouette is top-normal in size. The pulmonary vascularity is normal. There is calcification in the wall of the thoracic aorta. The bony thorax exhibits no acute abnormality. There is an old healed midshaft right clavicular fracture. IMPRESSION: Mild chronic bronchitic changes.  No pneumonia nor CHF. Thoracic aortic atherosclerosis. Electronically Signed   By: Zorawar  Martinique M.D.   On: 12/03/2016 15:52   Dg Chest 2 View  Result Date: 11/12/2016 CLINICAL DATA:  Increased shortness of breath and fatigue for the past several months becoming worse over the past 10 days. Former heavy smoker. EXAM: CHEST  2 VIEW COMPARISON:  Chest x-ray of January 31, 2016 FINDINGS: The lungs are well-expanded. The interstitial markings are coarse but this is chronic. There is no alveolar infiltrate. There is no pleural effusion. The heart and pulmonary vascularity are normal. There is calcification in the wall of the thoracic aorta. The mediastinum is normal in  width. The bony thorax exhibits no acute abnormality. IMPRESSION: COPD-smoking related changes, stable. No pneumonia, CHF, nor other acute cardiopulmonary abnormality. Thoracic aortic atherosclerosis. Electronically Signed   By: Aamari  Martinique M.D.   On: 11/12/2016 15:27   Ct Head Wo Contrast  Result Date: 12/03/2016 CLINICAL DATA:  Confusion and decreased appetite. EXAM: CT HEAD WITHOUT CONTRAST TECHNIQUE: Contiguous axial images were obtained from the base of the skull through the vertex without intravenous contrast. COMPARISON:  Sinus CT 11/08/2014 and 09/03/2012. FINDINGS: Brain: There is no evidence for acute hemorrhage, hydrocephalus, mass lesion, or abnormal extra-axial fluid collection. No definite CT evidence for acute infarction. Diffuse loss of parenchymal volume is consistent with atrophy. Patchy low  attenuation in the deep hemispheric and periventricular white matter is nonspecific, but likely reflects chronic microvascular ischemic demyelination. Tiny cortical calcification posterior right parieto-occipital region with benign features and may be related to prior infection or inflammation. Tiny vascular malformation also a consideration. Vascular: No hyperdense vessel or unexpected calcification. Skull: No evidence for fracture. No worrisome lytic or sclerotic lesion. Sinuses/Orbits: The visualized paranasal sinuses and mastoid air cells are clear. Visualized portions of the globes and intraorbital fat are unremarkable. Other: None. IMPRESSION: 1. No acute intracranial abnormality. 2. Atrophy with chronic small vessel white matter ischemic disease. Electronically Signed   By: Misty Stanley M.D.   On: 12/03/2016 15:46   Ct Abdomen Pelvis W Contrast  Result Date: 11/22/2016 CLINICAL DATA:  76 year old male with mid to lower abdominal pain and nausea for 1 day. Blood in the stool yesterday. EXAM: CT ABDOMEN AND PELVIS WITH CONTRAST TECHNIQUE: Multidetector CT imaging of the abdomen and pelvis was  performed using the standard protocol following bolus administration of intravenous contrast. CONTRAST:  120mL ISOVUE-300 IOPAMIDOL (ISOVUE-300) INJECTION 61% COMPARISON:  CT the chest, abdomen and pelvis 04/10/2015. FINDINGS: Lower chest: Mild scarring in the lung bases. Atherosclerotic calcifications in the distal right coronary artery. Hepatobiliary: A few scattered subcentimeter low-attenuation lesions are noted in the liver, too small to definitively characterize, but statistically likely to represent tiny cysts. 1.3 cm lesion in segment 2 of the liver is compatible with a simple cyst. No larger more suspicious appearing hepatic lesions are noted. No intra or extrahepatic biliary ductal dilatation. Gallbladder is normal in appearance. Pancreas: No pancreatic mass. No pancreatic ductal dilatation. No pancreatic or peripancreatic fluid or inflammatory changes. Spleen: Unremarkable. Adrenals/Urinary Tract: Several small nonobstructive calculi are present within the left renal collecting system measuring up to 4 mm in the lower pole. No calcifications are identified along the course of either ureter or within the lumen of the urinary bladder. There is no hydroureteronephrosis to indicate urinary tract obstruction at this time. Right kidney is normal in appearance. Multiple subcentimeter low-attenuation lesions in the left kidney are too small to definitively characterize, but statistically likely to represent tiny cysts. 12 mm exophytic simple cyst in the lower pole of the left kidney. Urinary bladder is nearly completely decompressed with a Foley balloon catheter in place, but is otherwise unremarkable in appearance. Bilateral adrenal glands are normal in appearance. Stomach/Bowel: Appearance of the stomach is normal. No pathologic dilatation of small bowel or colon. The appendix is not confidently identified and may be surgically absent. Regardless, there are no inflammatory changes noted adjacent to the cecum to  suggest the presence of an acute appendicitis at this time. Vascular/Lymphatic: Aortic atherosclerosis, without evidence of aneurysm or dissection in the abdominal or pelvic vasculature. Focal fusiform ectasia of the infrarenal abdominal aorta which measures up to 2.6 x 2.8 cm. Ectasia of the left common iliac artery which measures up to 1.4 cm in diameter. No lymphadenopathy noted in the abdomen or pelvis. Reproductive: Prostate gland and seminal vesicles are unremarkable in appearance. Other: No significant volume of ascites.  No pneumoperitoneum. Musculoskeletal: There are no aggressive appearing lytic or blastic lesions noted in the visualized portions of the skeleton. IMPRESSION: 1. No acute findings are noted with in the abdomen or pelvis to account for the patient's symptoms. 2. Several tiny nonobstructive calculi are present within the left renal collecting system measuring up to 4 mm in the lower pole. No ureteral stones or findings of urinary tract obstruction are noted at this  time. 3. Aortic atherosclerosis with ectasia of the infrarenal abdominal aorta at risk for aneurysm development. Recommend followup by ultrasound in 5 years. This recommendation follows ACR consensus guidelines: White Paper of the ACR Incidental Findings Committee II on Vascular Findings. J Am Coll Radiol 2013; 10:789-794. 4. Additional incidental findings, as above. Electronically Signed   By: Vinnie Langton M.D.   On: 11/22/2016 14:15   Dg Chest Port 1v Same Day  Result Date: 12/03/2016 CLINICAL DATA:  76 year old presenting with acute onset of fever and shortness of breath that began earlier today. EXAM: PORTABLE CHEST 1 VIEW 4:26 p.m.: COMPARISON:  Two-view chest x-ray earlier today 3:31 p.m., 11/12/2016 and earlier. CT chest 04/10/2015. FINDINGS: Cardiac silhouette upper normal in size to slightly enlarged for AP portable technique, unchanged. Mild pulmonary venous hypertension without overt edema. Lungs clear. No visible  pleural effusions. IMPRESSION: No acute cardiopulmonary disease. Electronically Signed   By: Evangeline Dakin M.D.   On: 12/03/2016 16:42    Microbiology: Recent Results (from the past 240 hour(s))  Urine culture     Status: Abnormal (Preliminary result)   Collection Time: 12/03/16  3:08 PM  Result Value Ref Range Status   Specimen Description URINE, CLEAN CATCH  Final   Special Requests NONE  Final   Culture >=100,000 COLONIES/mL GRAM NEGATIVE RODS (A)  Final   Report Status PENDING  Incomplete     Labs: Basic Metabolic Panel:  Recent Labs Lab 12/03/16 1350 12/04/16 0501 12/05/16 0454  NA 141 141 139  K 4.0 3.7 3.5  CL 108 107 106  CO2 24 26 26   GLUCOSE 120* 113* 93  BUN 29* 30* 19  CREATININE 1.12 1.29* 1.03  CALCIUM 9.4 8.7* 8.4*   Liver Function Tests:  Recent Labs Lab 12/03/16 1350 12/04/16 0501  AST 21 18  ALT 24 23  ALKPHOS 60 49  BILITOT 0.5 0.2*  PROT 7.5 6.5  ALBUMIN 3.7 3.2*   No results for input(s): LIPASE, AMYLASE in the last 168 hours. No results for input(s): AMMONIA in the last 168 hours. CBC:  Recent Labs Lab 12/03/16 1350 12/04/16 0501  WBC 7.1 7.1  HGB 16.0 14.2  HCT 47.2 43.4  MCV 88.6 89.3  PLT 174 170   Cardiac Enzymes:  Recent Labs Lab 12/03/16 1733 12/03/16 2317 12/04/16 0501  TROPONINI <0.03 <0.03 <0.03   BNP: BNP (last 3 results) No results for input(s): BNP in the last 8760 hours.  ProBNP (last 3 results)  Recent Labs  01/31/16 1411 11/12/16 1408  PROBNP 25.0 20.0    CBG:  Recent Labs Lab 12/03/16 1404  GLUCAP 104*       Signed:  HERNANDEZ ACOSTA,Ramah Langhans  [facility] Pager: [phone] 12/05/2016, 1:18 PM

## 2016-12-05 NOTE — Progress Notes (Signed)
Patient IV and tele removed, tolerated well. Foley left in place to follow up with urology after discharge per MD. Reviewed discharged instructions with patient and wife at this time. Patient and wife's expresses understanding.

## 2016-12-05 NOTE — Care Management Note (Signed)
Case Management Note  Patient Details  Name: [name] [name] MRN: [id number] Date of Birth: 12-15-1940  Subjective/Objective:                  Pt admitted with UTI sepsis. He is from home, lives with his wife and is ind with ADL's. Pt has dementia and per wife pt functions ind at baseline. He ambulates with no assistive devices. Pt's confusion is still worse than at baseline. Pt will DC with foley. He has been ordered [facility] nursing/PT. Pt's wife has requested [facility] be sent referral. She is aware [facility] has 48hrs to make first visit. Pt's wife plans to take time off from her part time job to ensure 24/7 supervision for a while after DC. No other needs communicated. Pt has RW/cane if he needs them.   Action/Plan: [name] [name], of [facility], aware of referral and will obtain pt info from chart. Pt discharging home today. Wife at bedside, will transport pt home.   Expected Discharge Date:  12/05/16               Expected Discharge Plan:  [place]  In-House Referral:  NA  Discharge planning Services  CM Consult  Post Acute Care Choice:  Home Health Choice offered to:  Patient, Spouse     HH Arranged:  RN, PT [facility] Agency:  [place]  Status of Service:  Completed, signed off   Sherald Barge, RN 12/05/2016, 12:36 PM

## 2016-12-05 NOTE — Care Management Important Message (Signed)
Important Message  Patient Details  Name: [name] [name] MRN: [id number] Date of Birth: 11/13/1940   Medicare Important Message Given:  Yes    Sherald Barge, RN 12/05/2016, 12:36 PM

## 2016-12-06 LAB — URINE CULTURE: Culture: >=100000 — AB

## 2016-12-18 ENCOUNTER — Ambulatory Visit (INDEPENDENT_AMBULATORY_CARE_PROVIDER_SITE_OTHER): Payer: Medicare Other | Admitting: Urology

## 2016-12-18 DIAGNOSIS — R338 Other retention of urine: Secondary | ICD-10-CM | POA: Diagnosis not present

## 2016-12-18 DIAGNOSIS — N401 Enlarged prostate with lower urinary tract symptoms: Secondary | ICD-10-CM | POA: Diagnosis not present

## 2016-12-21 ENCOUNTER — Ambulatory Visit (INDEPENDENT_AMBULATORY_CARE_PROVIDER_SITE_OTHER): Payer: Medicare Other | Admitting: Urology

## 2016-12-21 DIAGNOSIS — R338 Other retention of urine: Secondary | ICD-10-CM | POA: Diagnosis not present

## 2016-12-21 DIAGNOSIS — R3914 Feeling of incomplete bladder emptying: Secondary | ICD-10-CM | POA: Diagnosis not present

## 2016-12-21 DIAGNOSIS — N401 Enlarged prostate with lower urinary tract symptoms: Secondary | ICD-10-CM

## 2017-02-05 ENCOUNTER — Ambulatory Visit (INDEPENDENT_AMBULATORY_CARE_PROVIDER_SITE_OTHER): Payer: Medicare Other | Admitting: Urology

## 2017-02-05 DIAGNOSIS — N401 Enlarged prostate with lower urinary tract symptoms: Secondary | ICD-10-CM

## 2017-02-13 ENCOUNTER — Encounter: Payer: Self-pay | Admitting: Cardiovascular Disease

## 2017-02-13 ENCOUNTER — Ambulatory Visit (INDEPENDENT_AMBULATORY_CARE_PROVIDER_SITE_OTHER): Payer: Medicare Other | Admitting: Cardiovascular Disease

## 2017-02-13 VITALS — BP 122/82 | HR 72 | Ht 67.0 in | Wt 184.0 lb

## 2017-02-13 DIAGNOSIS — I209 Angina pectoris, unspecified: Secondary | ICD-10-CM | POA: Diagnosis not present

## 2017-02-13 DIAGNOSIS — I739 Peripheral vascular disease, unspecified: Secondary | ICD-10-CM

## 2017-02-13 DIAGNOSIS — Z8679 Personal history of other diseases of the circulatory system: Secondary | ICD-10-CM

## 2017-02-13 DIAGNOSIS — E7800 Pure hypercholesterolemia, unspecified: Secondary | ICD-10-CM

## 2017-02-13 DIAGNOSIS — I25118 Atherosclerotic heart disease of native coronary artery with other forms of angina pectoris: Secondary | ICD-10-CM

## 2017-02-13 DIAGNOSIS — I779 Disorder of arteries and arterioles, unspecified: Secondary | ICD-10-CM

## 2017-02-13 DIAGNOSIS — Z955 Presence of coronary angioplasty implant and graft: Secondary | ICD-10-CM

## 2017-02-13 DIAGNOSIS — I10 Essential (primary) hypertension: Secondary | ICD-10-CM

## 2017-02-13 NOTE — Patient Instructions (Addendum)
Medication Instructions:  Continue all current medications.  Labwork: none  Testing/Procedures:  Your physician has requested that you have a carotid duplex. This test is an ultrasound of the carotid arteries in your neck. It looks at blood flow through these arteries that supply the brain with blood. Allow one hour for this exam. There are no restrictions or special instructions.  Office will contact with results via phone or letter.    Follow-Up: Your physician wants you to follow up in: 6 months.  You will receive a reminder letter in the mail one-two months in advance.  If you don't receive a letter, please call our office to schedule the follow up appointment   Any Other Special Instructions Will Be Listed Below (If Applicable).  If you need a refill on your cardiac medications before your next appointment, please call your pharmacy.  

## 2017-02-13 NOTE — Progress Notes (Signed)
SUBJECTIVE: The patient presents for routine follow-up. He was hospitalized and discharged in early May due to urosepsis.  In summary, he reportedly has a history of CAD with LAD and left circumflex coronary artery stents in 2006, as well as hypertension, hyperlipidemia, SVT s/p ablation, and bilateral carotid artery occlusion. He has COPD and bronchiectasis and is followed by pulmonary.   Nuclear stress testing on 09/27/14 was normal with no evidence of ischemia or scar and normal LV systolic function. Echocardiogram demonstrated normal left ventricular systolic function, EF 80-03%, with mild to moderate aortic regurgitation.  He is gradually recuperating. He denies chest pain and shortness of breath. His wife says he has had a setback with regards to his Alzheimer's since his intensive care necessitating illness.  Soc: He and his wife, "[name] [name]" ([name]), live in [place]. She is also my patient. 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  Review of Systems: As per "subjective", otherwise negative.  No Known Allergies  Current Outpatient Prescriptions  Medication Sig Dispense Refill  . aspirin 81 MG tablet Take 81 mg by mouth every morning.     . cholecalciferol (VITAMIN D) 1000 units tablet Take 1,000 Units by mouth daily.    Marland Kitchen donepezil (ARICEPT) 23 MG TABS tablet Take 1 tablet by mouth daily.    Marland Kitchen escitalopram (LEXAPRO) 20 MG tablet Take 20 mg by mouth every morning.     . Ferrous Sulfate (IRON) 325 (65 Fe) MG TABS Take 1 tablet by mouth daily.    . finasteride (PROSCAR) 5 MG tablet Take 1 tablet by mouth daily.    Marland Kitchen LORazepam (ATIVAN) 2 MG tablet Take 2 mg by mouth at bedtime. Take 1 tablet in the morning and 2 tablets at bedtime.    . Melatonin 5 MG CAPS Take 2 capsules by mouth at bedtime.    . meloxicam (MOBIC) 7.5 MG tablet Take 7.5 mg by mouth daily as needed for pain.    . montelukast (SINGULAIR) 10 MG tablet Take 10 mg by mouth every morning.      Marland Kitchen NITROSTAT 0.4 MG SL tablet Place 0.4 mg under the tongue as needed.    . pantoprazole (PROTONIX) 40 MG tablet Take 40 mg by mouth daily.     . QUEtiapine (SEROQUEL) 200 MG tablet Take 200-400 mg by mouth 2 (two) times daily. Take 1 tablet in the morning and 2 tablets in the evening.    . ranolazine (RANEXA) 500 MG 12 hr tablet Take 1 tablet (500 mg total) by mouth 2 (two) times daily. 28 tablet 0  . Tamsulosin HCl (FLOMAX) 0.4 MG CAPS Take 0.4 mg by mouth daily.      No current facility-administered medications for this visit.     Past Medical History:  Diagnosis Date  . Anxiety   . Carotid stenosis   . COPD (chronic obstructive pulmonary disease) ([place])   . Coronary atherosclerosis of native coronary artery    DES to LAD and circumflex 2006  . Dementia    Dr Bjorn Loser - Neurology  . Depression   . Essential hypertension, benign   . GERD (gastroesophageal reflux disease)   . Glucose intolerance (impaired glucose tolerance)   . Hemorrhoids   . History of colon polyps   . Hyperlipidemia   . Insomnia   . Long-term memory loss    Aricept and Seroquel  . Nosebleed    09/15/12 - Dr Adriana Reams, ENT   . Osteoarthritis   .  Skin cancer   . Sleep apnea   . SVT (supraventricular tachycardia) ([facility])    Status post RFA    Past Surgical History:  Procedure Laterality Date  . BACK SURGERY    . Cataract surgery     Bilateral  . CERVICAL SPINE SURGERY  1978   x 2  . CIRCUMCISION     Age 20  . COLONOSCOPY    . EYE SURGERY    . FINGER SURGERY     Pointer finger on left hand  . [place]  . KNEE ARTHROSCOPY  04/03/2012   Procedure: ARTHROSCOPY KNEE;  Surgeon: Marin Shutter, MD;  Location: [facility];  Service: Orthopedics;  Laterality: Right;  Right Knee Arthroscopy with Debridement   . Polyp removed from nose    . Thumb surgery     Bilateral  . TONSILLECTOMY AND ADENOIDECTOMY    . TOTAL KNEE ARTHROPLASTY Right 10/07/2012   Procedure: TOTAL KNEE ARTHROPLASTY;  Surgeon:  Mauri Pole, MD;  Location: [facility];  Service: Orthopedics;  Laterality: Right;  Marland Kitchen VIDEO BRONCHOSCOPY Bilateral 07/13/2014   Procedure: VIDEO BRONCHOSCOPY WITH FLUORO;  Surgeon: Tanda Rockers, MD;  Location: [facility];  Service: Cardiopulmonary;  Laterality: Bilateral;    Social History   Social History  . Marital status: Married    Spouse name: N/A  . Number of children: N/A  . Years of education: N/A   Occupational History  . Retired    Social History Main Topics  . Smoking status: Former Smoker    Packs/day: 1.50    Years: 35.00    Types: Cigarettes    Start date: 03/07/1959    Quit date: 08/07/1999  . Smokeless tobacco: Never Used  . Alcohol use No  . Drug use: No  . Sexual activity: Yes   Other Topics Concern  . Not on file   Social History Narrative  . No narrative on file     Vitals:   02/13/17 1454  BP: 122/82  Pulse: 72  SpO2: 98%  Weight: 184 lb (83.5 kg)  Height: 5\' 7"  (1.702 m)    Wt Readings from Last 3 Encounters:  02/13/17 184 lb (83.5 kg)  12/05/16 183 lb 3.2 oz (83.1 kg)  11/23/16 182 lb (82.6 kg)     PHYSICAL EXAM General: NAD HEENT: Normal. Neck: No JVD, no thyromegaly. Lungs: Bilateral rhonchi. CV: Nondisplaced PMI.  Regular rate and rhythm, normal S1/S2, no S3/S4, no murmur. No pretibial or periankle edema.  No carotid bruit.   Abdomen: Soft, nontender, no distention.  Neurologic: Alert and oriented.  Psych: Normal affect. Skin: Normal. Musculoskeletal: No gross deformities.    ECG: Most recent ECG reviewed.   Labs: Lab Results  Component Value Date/Time   K 3.5 12/05/2016 04:54 AM   BUN 19 12/05/2016 04:54 AM   CREATININE 1.03 12/05/2016 04:54 AM   ALT 23 12/04/2016 05:01 AM   TSH 2.813 12/03/2016 07:43 PM   TSH 2.47 11/12/2016 02:08 PM   HGB 14.2 12/04/2016 05:01 AM     Lipids: Lab Results  Component Value Date/Time   LDLCALC 111 (H) 11/15/2011 08:36 AM   CHOL 193 11/15/2011 08:36 AM   TRIG 72.0 11/15/2011  08:36 AM   HDL 67.30 11/15/2011 08:36 AM       ASSESSMENT AND PLAN:  1. CAD with prior LAD and left circumflex stents: Symptomatically stable on Ranexa 500 mg bid. No changes. Exercise Cardiolite stress test and echocardiogram results noted above from  early 2016, no ischemia. Continue aspirin. Decided with his wife and reportedly his PCP to stop taking Lipitor in the past. Previously informed him regarding pleiotropic effects of statin therapy, but he preferred to not take it.  2. Essential HTN: Well controlled. No changes.  3. Hyperlipidemia:  Decided with his wife and reportedly his PCP to stop taking Lipitor in the past.  4. SVT s/p ablation: Symptomatically stable.  5. Carotid artery occlusion: I will obtain carotid Dopplers.   Disposition: Follow up 6 months   Kate Sable, M.D., F.A.C.C.

## 2017-03-13 ENCOUNTER — Ambulatory Visit: Payer: Medicare Other

## 2017-03-13 DIAGNOSIS — I779 Disorder of arteries and arterioles, unspecified: Secondary | ICD-10-CM

## 2017-03-13 DIAGNOSIS — R413 Other amnesia: Secondary | ICD-10-CM

## 2017-03-13 DIAGNOSIS — I739 Peripheral vascular disease, unspecified: Principal | ICD-10-CM

## 2017-03-14 ENCOUNTER — Telehealth: Payer: Self-pay | Admitting: *Deleted

## 2017-03-14 LAB — VAS US CAROTID
LCCAPDIAS: 15 cm/s
LCCAPSYS: 121 cm/s
LEFT ECA DIAS: -14 cm/s
LEFT VERTEBRAL DIAS: 6 cm/s
LICADDIAS: -21 cm/s
Left CCA dist dias: -14 cm/s
Left CCA dist sys: -81 cm/s
Left ICA dist sys: -100 cm/s
Left ICA prox dias: -12 cm/s
Left ICA prox sys: -49 cm/s
RCCAPDIAS: 11 cm/s
RIGHT ECA DIAS: -15 cm/s
RIGHT VERTEBRAL DIAS: 6 cm/s
Right CCA prox sys: 83 cm/s
Right cca dist sys: 78 cm/s

## 2017-03-14 NOTE — Telephone Encounter (Signed)
Notes recorded by Laurine Blazer, LPN on 09/09/8248 at 0:37 PM EDT Patient notified. Copy to pmd. ------  Notes recorded by Herminio Commons, MD on 03/14/2017 at 11:34 AM EDT No significant blockages.

## 2017-06-04 ENCOUNTER — Ambulatory Visit (INDEPENDENT_AMBULATORY_CARE_PROVIDER_SITE_OTHER): Payer: Medicare Other | Admitting: *Deleted

## 2017-06-04 DIAGNOSIS — Z23 Encounter for immunization: Secondary | ICD-10-CM | POA: Diagnosis not present

## 2017-06-10 ENCOUNTER — Other Ambulatory Visit: Payer: Self-pay | Admitting: Cardiovascular Disease

## 2017-08-13 DIAGNOSIS — B351 Tinea unguium: Secondary | ICD-10-CM | POA: Diagnosis not present

## 2017-08-13 DIAGNOSIS — M79676 Pain in unspecified toe(s): Secondary | ICD-10-CM | POA: Diagnosis not present

## 2017-09-03 ENCOUNTER — Encounter: Payer: Self-pay | Admitting: Cardiovascular Disease

## 2017-09-03 ENCOUNTER — Ambulatory Visit: Payer: PPO | Admitting: Cardiovascular Disease

## 2017-09-03 ENCOUNTER — Other Ambulatory Visit: Payer: Self-pay

## 2017-09-03 VITALS — BP 130/84 | HR 61 | Ht 67.0 in | Wt 180.0 lb

## 2017-09-03 DIAGNOSIS — Z955 Presence of coronary angioplasty implant and graft: Secondary | ICD-10-CM | POA: Diagnosis not present

## 2017-09-03 DIAGNOSIS — I779 Disorder of arteries and arterioles, unspecified: Secondary | ICD-10-CM | POA: Diagnosis not present

## 2017-09-03 DIAGNOSIS — I739 Peripheral vascular disease, unspecified: Secondary | ICD-10-CM

## 2017-09-03 DIAGNOSIS — Z8679 Personal history of other diseases of the circulatory system: Secondary | ICD-10-CM

## 2017-09-03 DIAGNOSIS — I25118 Atherosclerotic heart disease of native coronary artery with other forms of angina pectoris: Secondary | ICD-10-CM

## 2017-09-03 DIAGNOSIS — E7800 Pure hypercholesterolemia, unspecified: Secondary | ICD-10-CM | POA: Diagnosis not present

## 2017-09-03 DIAGNOSIS — I10 Essential (primary) hypertension: Secondary | ICD-10-CM

## 2017-09-03 NOTE — Patient Instructions (Signed)
Your physician wants you to follow-up in: 1 YEAR WITH DR KONESWARAN You will receive a reminder letter in the mail two months in advance. If you don't receive a letter, please call our office to schedule the follow-up appointment.  Your physician recommends that you continue on your current medications as directed. Please refer to the Current Medication list given to you today.  Thank you for choosing [facility]!!    

## 2017-09-03 NOTE — Progress Notes (Signed)
SUBJECTIVE: The patient presents for routine follow-up.  In summary, he reportedly has a history of CAD with LAD and left circumflex coronary artery stents in 2006, as well as hypertension, hyperlipidemia, SVT s/p ablation, and bilateral carotid artery disease. He has COPD and bronchiectasis and is followed by pulmonary.   Nuclear stress testing on 09/27/14 was normal with no evidence of ischemia or scar and normal LV systolic function. Echocardiogram demonstrated normal left ventricular systolic function, EF 96-22%, with mild to moderate aortic regurgitation.  He currently denies chest pain, palpitations, leg swelling, and shortness of breath.  He has advanced Alzheimer's and his wife has been under a lot of stress having to provide full-time care for him.   Soc: He and his wife, [name], live in [place]. She is also my patient. 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  He had to retire early, likely due to Alzheimer's disease  Review of Systems: As per "subjective", otherwise negative.  Allergies  Allergen Reactions  . Namenda [Memantine Hcl] Other (See Comments)    Hallucinations     Current Outpatient Medications  Medication Sig Dispense Refill  . aspirin 81 MG tablet Take 81 mg by mouth every morning.     . cholecalciferol (VITAMIN D) 1000 units tablet Take 1,000 Units by mouth daily.    Marland Kitchen donepezil (ARICEPT) 23 MG TABS tablet Take 1 tablet by mouth daily.    Marland Kitchen escitalopram (LEXAPRO) 20 MG tablet Take 20 mg by mouth every morning.     . finasteride (PROSCAR) 5 MG tablet Take 1 tablet by mouth daily.    Marland Kitchen LORazepam (ATIVAN) 2 MG tablet Take 2 mg by mouth at bedtime. Take 1 tablet in the morning and 2 tablets at bedtime.    . Melatonin 5 MG CAPS Take 2 capsules by mouth at bedtime.    . meloxicam (MOBIC) 7.5 MG tablet Take 7.5 mg by mouth daily as needed for pain.    . montelukast (SINGULAIR) 10 MG tablet Take 10 mg by mouth every  morning.    Marland Kitchen NITROSTAT 0.4 MG SL tablet Place 0.4 mg under the tongue as needed.    . pantoprazole (PROTONIX) 40 MG tablet Take 40 mg by mouth daily.     . QUEtiapine (SEROQUEL) 200 MG tablet Take 200-400 mg by mouth 2 (two) times daily. Take 1 tablet in the morning and 2 tablets in the evening.    Marland Kitchen RANEXA 500 MG 12 hr tablet TAKE ONE TABLET BY MOUTH TWICE DAILY 180 tablet 3  . ranolazine (RANEXA) 500 MG 12 hr tablet Take 1 tablet (500 mg total) by mouth 2 (two) times daily. 28 tablet 0  . Tamsulosin HCl (FLOMAX) 0.4 MG CAPS Take 0.4 mg by mouth daily.      No current facility-administered medications for this visit.     Past Medical History:  Diagnosis Date  . Anxiety   . Carotid stenosis   . COPD (chronic obstructive pulmonary disease) ([place])   . Coronary atherosclerosis of native coronary artery    DES to LAD and circumflex 2006  . Dementia    Dr Bjorn Loser - Neurology  . Depression   . Essential hypertension, benign   . GERD (gastroesophageal reflux disease)   . Glucose intolerance (impaired glucose tolerance)   . Hemorrhoids   . History of colon polyps   . Hyperlipidemia   . Insomnia   . Long-term memory loss  Aricept and Seroquel  . Nosebleed    09/15/12 - Dr Adriana Reams, ENT   . Osteoarthritis   . Skin cancer   . Sleep apnea   . SVT (supraventricular tachycardia) ([facility])    Status post RFA    Past Surgical History:  Procedure Laterality Date  . BACK SURGERY    . Cataract surgery     Bilateral  . CERVICAL SPINE SURGERY  1978   x 2  . CIRCUMCISION     Age 53  . COLONOSCOPY    . EYE SURGERY    . FINGER SURGERY     Pointer finger on left hand  . [place]  . KNEE ARTHROSCOPY  04/03/2012   Procedure: ARTHROSCOPY KNEE;  Surgeon: Marin Shutter, MD;  Location: [facility];  Service: Orthopedics;  Laterality: Right;  Right Knee Arthroscopy with Debridement   . Polyp removed from nose    . Thumb surgery     Bilateral  . TONSILLECTOMY AND ADENOIDECTOMY    .  TOTAL KNEE ARTHROPLASTY Right 10/07/2012   Procedure: TOTAL KNEE ARTHROPLASTY;  Surgeon: Mauri Pole, MD;  Location: [facility];  Service: Orthopedics;  Laterality: Right;  Marland Kitchen VIDEO BRONCHOSCOPY Bilateral 07/13/2014   Procedure: VIDEO BRONCHOSCOPY WITH FLUORO;  Surgeon: Tanda Rockers, MD;  Location: [facility];  Service: Cardiopulmonary;  Laterality: Bilateral;    Social History   Socioeconomic History  . Marital status: Married    Spouse name: Not on file  . Number of children: Not on file  . Years of education: Not on file  . Highest education level: Not on file  Social Needs  . Financial resource strain: Not on file  . Food insecurity - worry: Not on file  . Food insecurity - inability: Not on file  . Transportation needs - medical: Not on file  . Transportation needs - non-medical: Not on file  Occupational History  . Occupation: Retired  Tobacco Use  . Smoking status: Former Smoker    Packs/day: 1.50    Years: 35.00    Pack years: 52.50    Types: Cigarettes    Start date: 03/07/1959    Last attempt to quit: 08/07/1999    Years since quitting: 18.0  . Smokeless tobacco: Never Used  Substance and Sexual Activity  . Alcohol use: No    Alcohol/week: 0.0 oz  . Drug use: No  . Sexual activity: Yes  Other Topics Concern  . Not on file  Social History Narrative  . Not on file     Vitals:   09/03/17 1527  BP: 130/84  Pulse: 61  SpO2: 96%  Weight: 180 lb (81.6 kg)  Height: 5\' 7"  (1.702 m)    Wt Readings from Last 3 Encounters:  09/03/17 180 lb (81.6 kg)  02/13/17 184 lb (83.5 kg)  12/05/16 183 lb 3.2 oz (83.1 kg)     PHYSICAL EXAM General: NAD HEENT: Normal. Neck: No JVD, no thyromegaly. Lungs: Clear to auscultation bilaterally with normal respiratory effort. CV: Regular rate and rhythm, normal S1/S2, no S3/S4, no murmur. No pretibial or periankle edema.  No carotid bruit.   Abdomen: Soft, nontender, no distention.  Neurologic: Alert and oriented.  Psych:  Normal affect. Skin: Normal. Musculoskeletal: No gross deformities.    ECG: Most recent ECG reviewed.   Labs: Lab Results  Component Value Date/Time   K 3.5 12/05/2016 04:54 AM   BUN 19 12/05/2016 04:54 AM   CREATININE 1.03 12/05/2016 04:54 AM  ALT 23 12/04/2016 05:01 AM   TSH 2.813 12/03/2016 07:43 PM   TSH 2.47 11/12/2016 02:08 PM   HGB 14.2 12/04/2016 05:01 AM     Lipids: Lab Results  Component Value Date/Time   LDLCALC 111 (H) 11/15/2011 08:36 AM   CHOL 193 11/15/2011 08:36 AM   TRIG 72.0 11/15/2011 08:36 AM   HDL 67.30 11/15/2011 08:36 AM       ASSESSMENT AND PLAN:  1. CAD with prior LAD and left circumflexstents: Symptomatically stable onRanexa 500 mg bid. No changes.  Due to cost, his wife may wean him off of Ranexa.  I did inform her that there is an assistance program.  Exercise Cardiolite stress test and echocardiogram results noted above from early 2016, no ischemia. Continue aspirin. Decided with his wife and reportedly his PCP to stop taking Lipitor in the past. Previously informed him regarding pleiotropic effects of statin therapy, but he preferred to not take it.  2. Essential HTN: Well controlled. No changes.  3. Hyperlipidemia: Decided with his wife and reportedly his PCP to stop taking Lipitor in the past.  4. SVT s/p ablation: Symptomatically stable.  5. Carotid artery disease: 1-39% bilateral internal carotid artery stenosis in August 2018.  Continue aspirin.  He defers statin therapy.     Disposition: Follow up 1 year   Kate Sable, M.D., F.A.C.C.

## 2017-10-09 DIAGNOSIS — R300 Dysuria: Secondary | ICD-10-CM | POA: Diagnosis not present

## 2017-10-22 DIAGNOSIS — M79676 Pain in unspecified toe(s): Secondary | ICD-10-CM | POA: Diagnosis not present

## 2017-10-22 DIAGNOSIS — B351 Tinea unguium: Secondary | ICD-10-CM | POA: Diagnosis not present

## 2017-10-24 IMAGING — DX DG CHEST 2V
2 series · 2 of 2 positions shown · non-contrast
Comparison: PA and lateral chest x-ray November 12, 2016

CLINICAL DATA: Cough, COPD, former smoker, patient unable to
provide history due to dementia.

EXAM:
CHEST  2 VIEW

[chest lat]
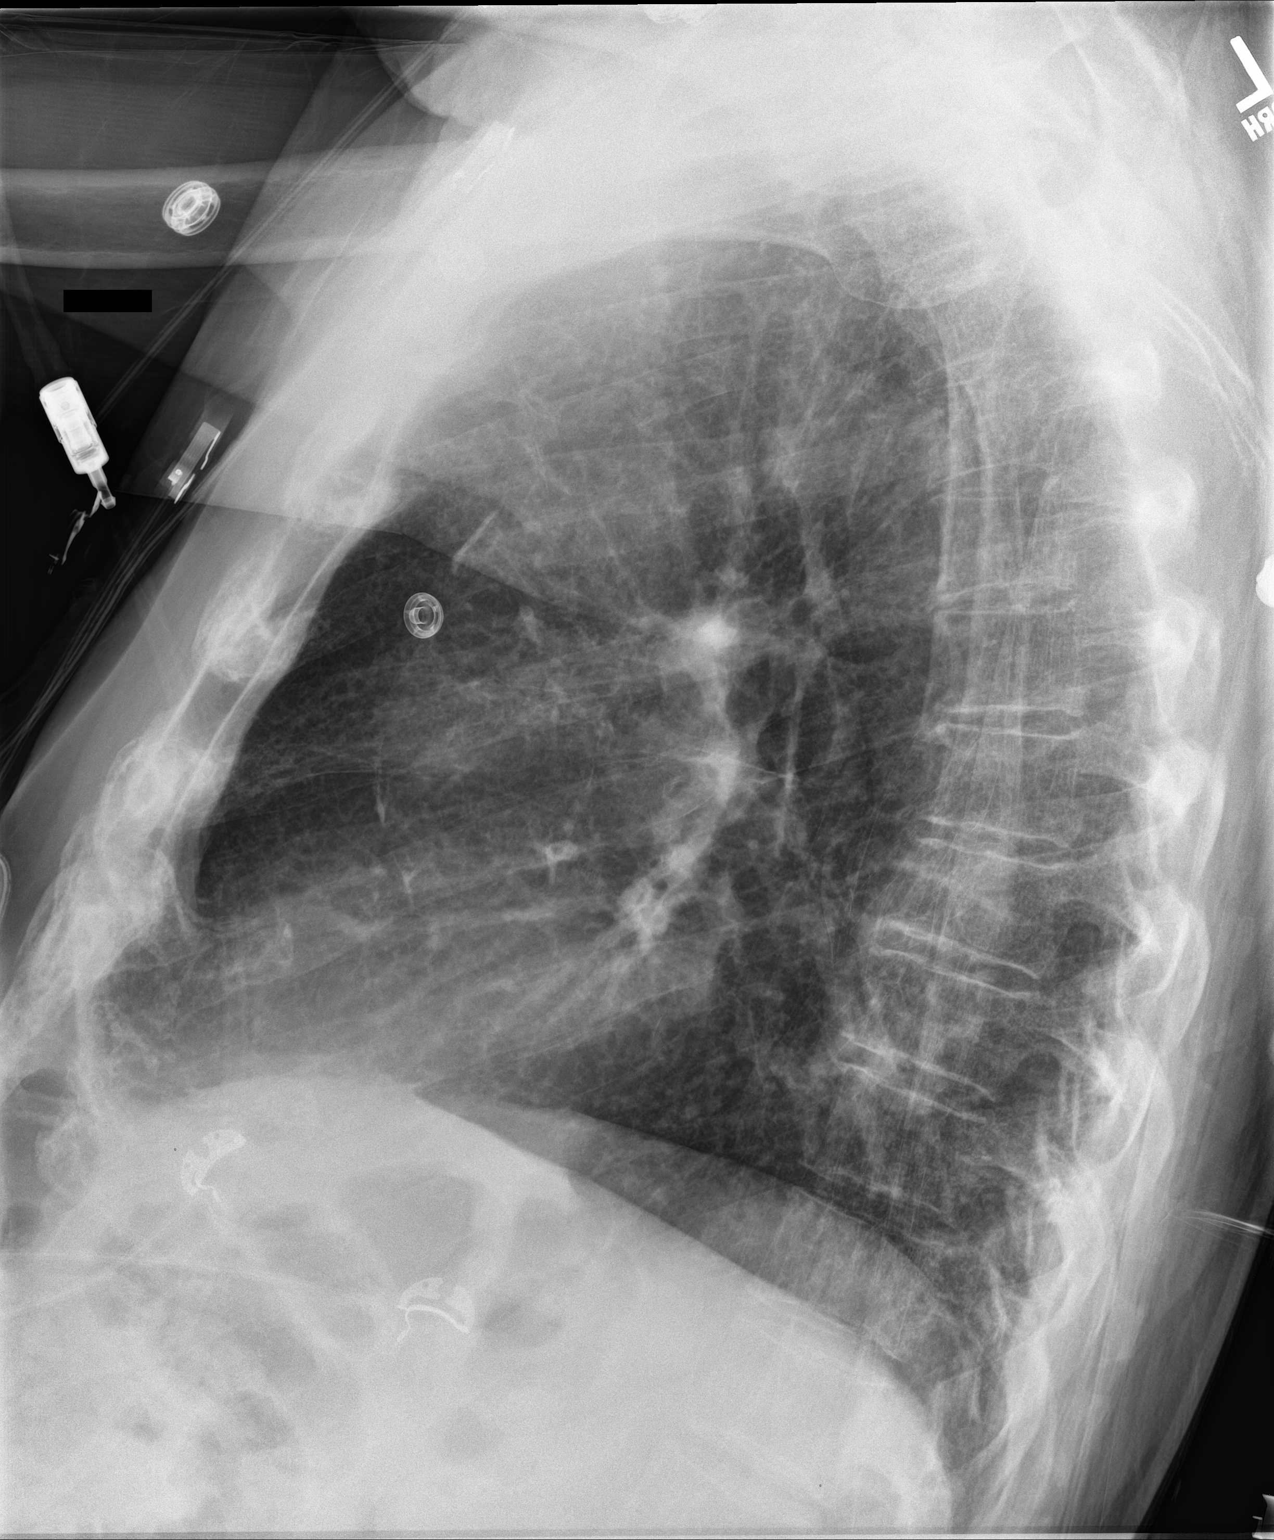

[chest ap]
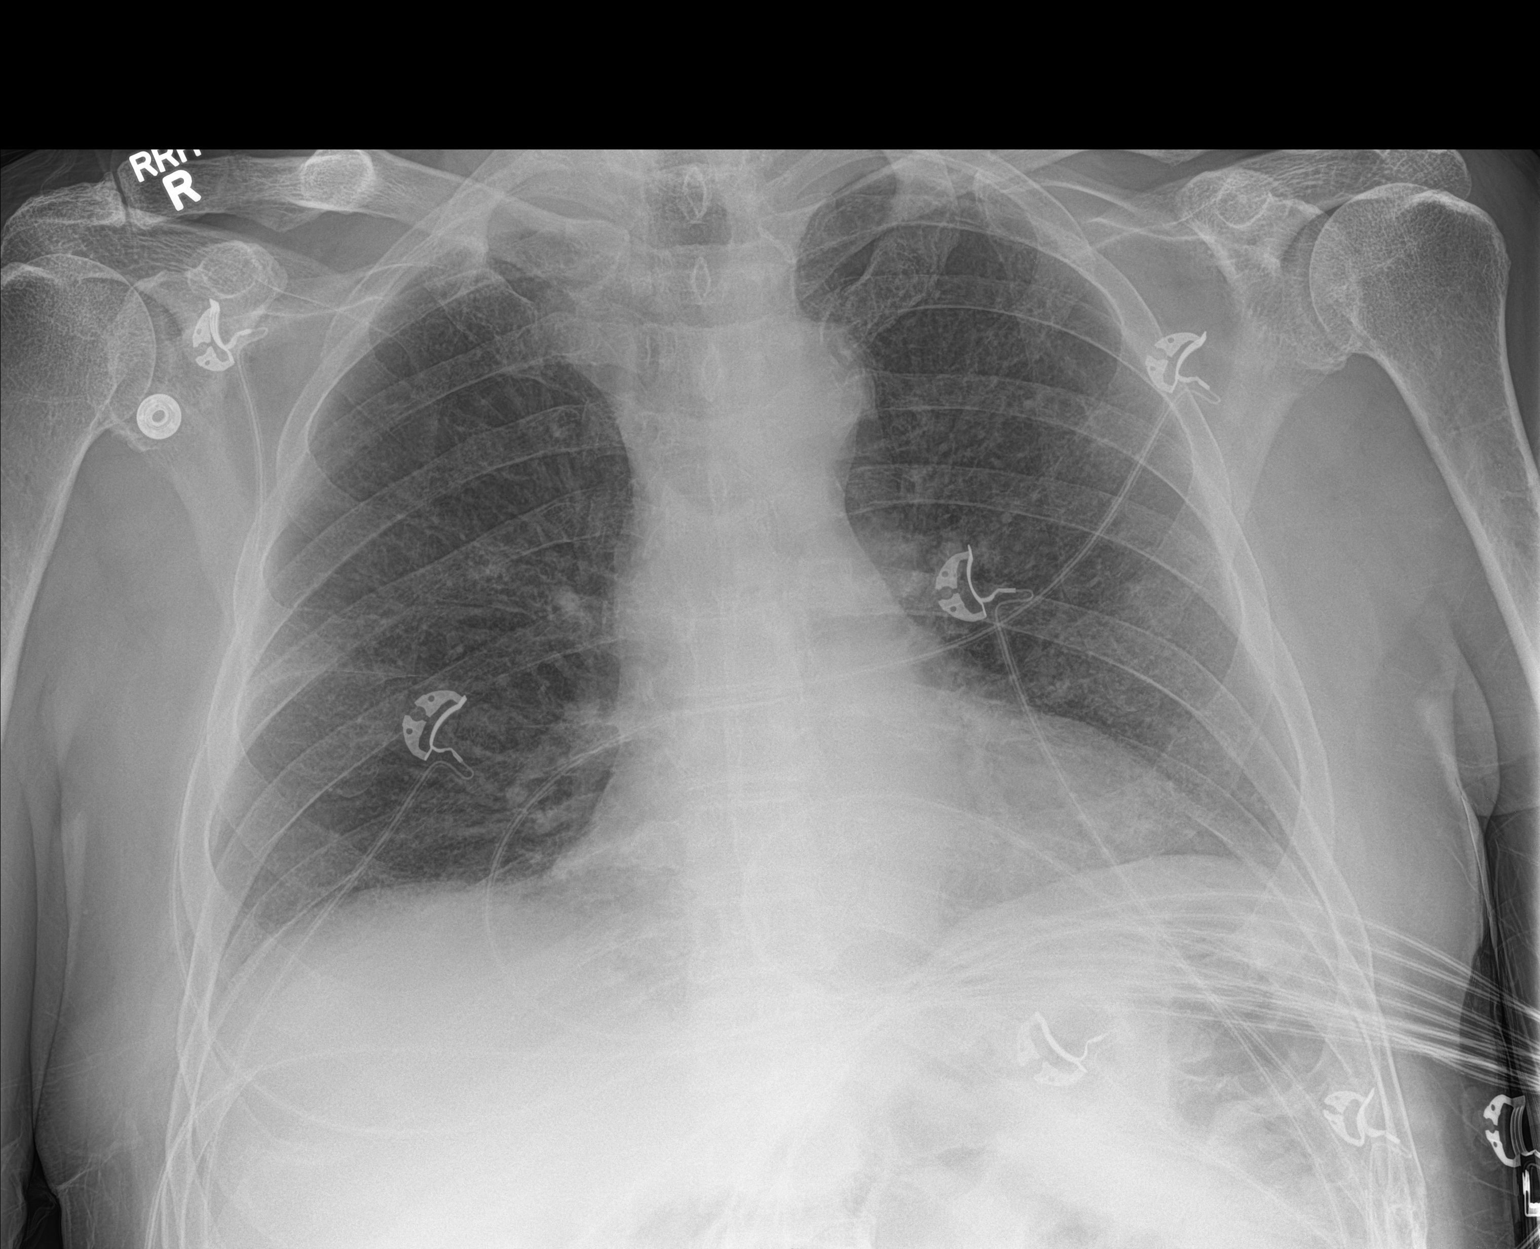

[2 of 2 positions shown; findings below may reference images not displayed]

FINDINGS: The lungs are reasonably well inflated. There is no alveolar
infiltrate or pleural effusion. The cardiac silhouette is top-normal
in size. The pulmonary vascularity is normal. There is calcification
in the wall of the thoracic aorta. The bony thorax exhibits no acute
abnormality. There is an old healed midshaft right clavicular
fracture.
IMPRESSION: Mild chronic bronchitic changes.  No pneumonia nor CHF.

Thoracic aortic atherosclerosis.

## 2017-10-25 DIAGNOSIS — R300 Dysuria: Secondary | ICD-10-CM | POA: Diagnosis not present

## 2017-10-28 ENCOUNTER — Other Ambulatory Visit: Payer: Self-pay

## 2017-10-28 ENCOUNTER — Emergency Department (HOSPITAL_COMMUNITY): Payer: PPO

## 2017-10-28 ENCOUNTER — Encounter (HOSPITAL_COMMUNITY): Payer: Self-pay | Admitting: Emergency Medicine

## 2017-10-28 ENCOUNTER — Emergency Department (HOSPITAL_COMMUNITY)
Admission: EM | Admit: 2017-10-28 | Discharge: 2017-10-28 | Disposition: A | Discharge status: Home / Self Care | Payer: PPO | Attending: Emergency Medicine | Admitting: Emergency Medicine

## 2017-10-28 DIAGNOSIS — J449 Chronic obstructive pulmonary disease, unspecified: Secondary | ICD-10-CM | POA: Diagnosis not present

## 2017-10-28 DIAGNOSIS — Z87891 Personal history of nicotine dependence: Secondary | ICD-10-CM | POA: Diagnosis not present

## 2017-10-28 DIAGNOSIS — I10 Essential (primary) hypertension: Secondary | ICD-10-CM | POA: Diagnosis not present

## 2017-10-28 DIAGNOSIS — Z96651 Presence of right artificial knee joint: Secondary | ICD-10-CM | POA: Insufficient documentation

## 2017-10-28 DIAGNOSIS — R531 Weakness: Secondary | ICD-10-CM | POA: Diagnosis not present

## 2017-10-28 DIAGNOSIS — F0150 Vascular dementia without behavioral disturbance: Secondary | ICD-10-CM | POA: Insufficient documentation

## 2017-10-28 DIAGNOSIS — R4182 Altered mental status, unspecified: Secondary | ICD-10-CM | POA: Diagnosis not present

## 2017-10-28 DIAGNOSIS — Z79899 Other long term (current) drug therapy: Secondary | ICD-10-CM | POA: Insufficient documentation

## 2017-10-28 DIAGNOSIS — R4020 Unspecified coma: Secondary | ICD-10-CM | POA: Diagnosis not present

## 2017-10-28 DIAGNOSIS — Z7982 Long term (current) use of aspirin: Secondary | ICD-10-CM | POA: Diagnosis not present

## 2017-10-28 DIAGNOSIS — I2510 Atherosclerotic heart disease of native coronary artery without angina pectoris: Secondary | ICD-10-CM | POA: Diagnosis not present

## 2017-10-28 LAB — CBC WITH DIFFERENTIAL/PLATELET
BASOS ABS: 0 10*3/uL (ref 0.0–0.1)
BASOS PCT: 1 %
EOS ABS: 0 10*3/uL (ref 0.0–0.7)
Eosinophils Relative: 1 %
HEMATOCRIT: 44.9 % (ref 39.0–52.0)
Hemoglobin: 14.7 g/dL (ref 13.0–17.0)
Lymphocytes Relative: 34 %
Lymphs Abs: 1.6 10*3/uL (ref 0.7–4.0)
MCH: 29.1 pg (ref 26.0–34.0)
MCHC: 32.7 g/dL (ref 30.0–36.0)
MCV: 88.9 fL (ref 78.0–100.0)
Monocytes Absolute: 0.9 10*3/uL (ref 0.1–1.0)
Monocytes Relative: 18 %
NEUTROS ABS: 2.3 10*3/uL (ref 1.7–7.7)
NEUTROS PCT: 46 %
PLATELETS: 165 10*3/uL (ref 150–400)
RBC: 5.05 MIL/uL (ref 4.22–5.81)
RDW: 13.8 % (ref 11.5–15.5)
WBC: 4.8 10*3/uL (ref 4.0–10.5)

## 2017-10-28 LAB — COMPREHENSIVE METABOLIC PANEL
ALT: 27 U/L (ref 17–63)
ANION GAP: 10 (ref 5–15)
AST: 27 U/L (ref 15–41)
Albumin: 3.9 g/dL (ref 3.5–5.0)
Alkaline Phosphatase: 61 U/L (ref 38–126)
BILIRUBIN TOTAL: 1 mg/dL (ref 0.3–1.2)
BUN: 12 mg/dL (ref 6–20)
CO2: 26 mmol/L (ref 22–32)
Calcium: 9.2 mg/dL (ref 8.9–10.3)
Chloride: 103 mmol/L (ref 101–111)
Creatinine, Ser: 1 mg/dL (ref 0.61–1.24)
GFR calc Af Amer: >60 mL/min (ref >60)
Glucose, Bld: 110 mg/dL — ABNORMAL HIGH (ref 65–99)
POTASSIUM: 3.5 mmol/L (ref 3.5–5.1)
Sodium: 139 mmol/L (ref 135–145)
TOTAL PROTEIN: 6.9 g/dL (ref 6.5–8.1)

## 2017-10-28 LAB — CBG MONITORING, ED: GLUCOSE-CAPILLARY: 105 mg/dL — AB (ref 65–99)

## 2017-10-28 LAB — TROPONIN I

## 2017-10-28 LAB — URINALYSIS, ROUTINE W REFLEX MICROSCOPIC
BILIRUBIN URINE: NEGATIVE
Glucose, UA: NEGATIVE mg/dL
Hgb urine dipstick: NEGATIVE
KETONES UR: NEGATIVE mg/dL
Leukocytes, UA: NEGATIVE
NITRITE: NEGATIVE
PROTEIN: NEGATIVE mg/dL
SPECIFIC GRAVITY, URINE: 1.018 (ref 1.005–1.030)
pH: 6 (ref 5.0–8.0)

## 2017-10-28 NOTE — ED Notes (Signed)
Patient noted to have right arm tremors during initial assessment. No distress noted.  Spouse at bedside, stated patient had same type tremors at bedtime night before.   No history of tremors before this.

## 2017-10-28 NOTE — ED Provider Notes (Signed)
[facility] Provider Note   CSN: [id number] Arrival date & time: 10/28/17  1556     History   Chief Complaint Chief Complaint  Patient presents with  . Altered Mental Status    HPI [name] [name] is a 77 y.o. male.  Patient's wife states the patient has become more confused lately he has significant dementia though  The history is provided by a relative. No language interpreter was used.  Altered Mental Status   This is a chronic problem. The current episode started more than 1 week ago. The problem has been gradually worsening. Pertinent negatives include no unresponsiveness. Risk factors: Alzheimer dementia. His past medical history does not include seizures.    Past Medical History:  Diagnosis Date  . Anxiety   . Carotid stenosis   . COPD (chronic obstructive pulmonary disease) ([place])   . Coronary atherosclerosis of native coronary artery    DES to LAD and circumflex 2006  . Dementia    Dr Bjorn Loser - Neurology  . Depression   . Essential hypertension, benign   . GERD (gastroesophageal reflux disease)   . Glucose intolerance (impaired glucose tolerance)   . Hemorrhoids   . History of colon polyps   . Hyperlipidemia   . Insomnia   . Long-term memory loss    Aricept and Seroquel  . Nosebleed    09/15/12 - Dr Adriana Reams, ENT   . Osteoarthritis   . Skin cancer   . Sleep apnea   . SVT (supraventricular tachycardia) ([place])    Status post RFA    Patient Active Problem List   Diagnosis Date Noted  . Sepsis ([place]) 12/03/2016  . Fever, unspecified 12/03/2016  . Tachycardia 12/03/2016  . Urinary tract infection without hematuria   . Hyperlipidemia   . Dyspnea on exertion 01/31/2016  . Pneumothorax 07/13/2014  . Pulmonary infiltrates 07/08/2014  . History of PSVT (paroxysmal supraventricular tachycardia) 03/16/2013  . S/P right TKA 10/08/2012  . Bilateral carotid artery occlusion 12/04/2010  . HYPERLIPIDEMIA 11/10/2008  . HYPERTENSION, BENIGN  11/10/2008  . CAD, NATIVE VESSEL 11/10/2008    Past Surgical History:  Procedure Laterality Date  . BACK SURGERY    . Cataract surgery     Bilateral  . CERVICAL SPINE SURGERY  1978   x 2  . CIRCUMCISION     Age 27  . COLONOSCOPY    . EYE SURGERY    . FINGER SURGERY     Pointer finger on left hand  . [place]  . KNEE ARTHROSCOPY  04/03/2012   Procedure: ARTHROSCOPY KNEE;  Surgeon: Marin Shutter, MD;  Location: [facility];  Service: Orthopedics;  Laterality: Right;  Right Knee Arthroscopy with Debridement   . Polyp removed from nose    . Thumb surgery     Bilateral  . TONSILLECTOMY AND ADENOIDECTOMY    . TOTAL KNEE ARTHROPLASTY Right 10/07/2012   Procedure: TOTAL KNEE ARTHROPLASTY;  Surgeon: Mauri Pole, MD;  [facility]: [facility];  Service: Orthopedics;  Laterality: Right;  Marland Kitchen VIDEO BRONCHOSCOPY Bilateral 07/13/2014   Procedure: VIDEO BRONCHOSCOPY WITH FLUORO;  Surgeon: Tanda Rockers, MD;  [facility];  Service: Cardiopulmonary;  Laterality: Bilateral;        Home Medications    Prior to Admission medications   Medication Sig Start Date End Date Taking? Authorizing Provider  aspirin 81 MG tablet Take 81 mg by mouth every morning.     [provider]  cholecalciferol (VITAMIN  D) 1000 units tablet Take 1,000 Units by mouth daily.    [provider]  donepezil (ARICEPT) 23 MG TABS tablet Take 1 tablet by mouth daily. 08/05/15   [provider]  escitalopram (LEXAPRO) 20 MG tablet Take 20 mg by mouth every morning.     [provider]  finasteride (PROSCAR) 5 MG tablet Take 1 tablet by mouth daily. 11/29/16   [provider]  LORazepam (ATIVAN) 2 MG tablet Take 2 mg by mouth at bedtime. Take 1 tablet in the morning and 2 tablets at bedtime.    [provider]  Melatonin 5 MG CAPS Take 2 capsules by mouth at bedtime.    [provider]  meloxicam (MOBIC) 7.5 MG tablet Take 7.5 mg by mouth daily as  needed for pain.    [provider]  montelukast (SINGULAIR) 10 MG tablet Take 10 mg by mouth every morning.    [provider]  NITROSTAT 0.4 MG SL tablet Place 0.4 mg under the tongue as needed. 07/20/14   [provider]  pantoprazole (PROTONIX) 40 MG tablet Take 40 mg by mouth daily.     [provider]  QUEtiapine (SEROQUEL) 200 MG tablet Take 200-400 mg by mouth 2 (two) times daily. Take 1 tablet in the morning and 2 tablets in the evening.    [provider]  RANEXA 500 MG 12 hr tablet TAKE ONE TABLET BY MOUTH TWICE DAILY 06/10/17   Herminio Commons, MD  ranolazine (RANEXA) 500 MG 12 hr tablet Take 1 tablet (500 mg total) by mouth 2 (two) times daily. 08/23/15   Herminio Commons, MD  Tamsulosin HCl (FLOMAX) 0.4 MG CAPS Take 0.4 mg by mouth daily.     [provider]    Family History Family History  Problem Relation Age of Onset  . Other Mother 78       Died old age  . Parkinsonism Mother   . Alzheimer's disease Father 4       Died  . Cancer Sister   . Arthritis Sister   . Heart disease Brother        Bypass surgery in his 65's  . Stroke Brother   . Lung cancer Sister        smoked    Social History Social History   Tobacco Use  . Smoking status: Former Smoker    Packs/day: 1.50    Years: 35.00    Pack years: 52.50    Types: Cigarettes    Start date: 03/07/1959    Last attempt to quit: 08/07/1999    Years since quitting: 18.2  . Smokeless tobacco: Never Used  Substance Use Topics  . Alcohol use: No    Alcohol/week: 0.0 oz  . Drug use: No     Allergies   Namenda [memantine hcl]   Review of Systems Review of Systems  Unable to perform ROS: Dementia     Physical Exam Updated Vital Signs BP (!) 149/96   Pulse (!) 107   Temp 98.6 F (37 C) (Oral)   Resp 16   Ht 5\' 8"  (1.727 m)   Wt 81.6 kg (180 lb)   SpO2 95%   BMI 27.37 kg/m   Physical Exam  Constitutional: He appears well-developed.    HENT:  Head: Normocephalic.  Eyes: Conjunctivae and EOM are normal. No scleral icterus.  Neck: Neck supple. No thyromegaly present.  Cardiovascular: Normal rate and regular rhythm. Exam reveals no gallop and no  friction rub.  No murmur heard. Pulmonary/Chest: No stridor. He has no wheezes. He has no rales. He exhibits no tenderness.  Abdominal: He exhibits no distension. There is no tenderness. There is no rebound.  Musculoskeletal: Normal range of motion. He exhibits no edema.  Lymphadenopathy:    He has no cervical adenopathy.  Neurological: He exhibits normal muscle tone. Coordination normal.  Oriented to person only  Skin: No rash noted. No erythema.  Psychiatric: He has a normal mood and affect. His behavior is normal.     ED Treatments / Results  Labs (all labs ordered are listed, but only abnormal results are displayed) Labs Reviewed  COMPREHENSIVE METABOLIC PANEL - Abnormal; Notable for the following components:      Result Value   Glucose, Bld 110 (*)    All other components within normal limits  CBG MONITORING, ED - Abnormal; Notable for the following components:   Glucose-Capillary 105 (*)    All other components within normal limits  CBC WITH DIFFERENTIAL/PLATELET  TROPONIN I  URINALYSIS, ROUTINE W REFLEX MICROSCOPIC    EKG EKG Interpretation  Date/Time:  Monday October 28 2017 17:12:17 EDT Ventricular Rate:  68 PR Interval:    QRS Duration: 90 QT Interval:  410 QTC Calculation: 436 R Axis:   -35 Text Interpretation:  Sinus rhythm Left axis deviation Artifact in lead(s) I II III aVR aVL Confirmed by Milton Ferguson [phone]) on 10/28/2017 7:53:25 PM   Radiology Dg Chest 2 View  Result Date: 10/28/2017 CLINICAL DATA:  Weakness, altered mental status EXAM: CHEST - 2 VIEW COMPARISON:  12/03/2016 FINDINGS: Heart and mediastinal contours are within normal limits. No focal opacities or effusions. No acute bony abnormality. IMPRESSION: No active cardiopulmonary  disease. Electronically Signed   By: Rolm Baptise M.D.   On: 10/28/2017 18:13   Ct Head Wo Contrast  Result Date: 10/28/2017 CLINICAL DATA:  Altered level of consciousness EXAM: CT HEAD WITHOUT CONTRAST TECHNIQUE: Contiguous axial images were obtained from the base of the skull through the vertex without intravenous contrast. COMPARISON:  12/03/2016 FINDINGS: Brain: There is atrophy and chronic small vessel disease changes. No acute intracranial abnormality. Specifically, no hemorrhage, hydrocephalus, mass lesion, acute infarction, or significant intracranial injury. Vascular: No hyperdense vessel or unexpected calcification. Skull: No acute calvarial abnormality. Sinuses/Orbits: Visualized paranasal sinuses and mastoids clear. Orbital soft tissues unremarkable. Other: None IMPRESSION: No acute intracranial abnormality. Atrophy, chronic microvascular disease. Electronically Signed   By: Rolm Baptise M.D.   On: 10/28/2017 17:58    Procedures Procedures (including critical care time)  Medications Ordered in ED Medications - No data to display   Initial Impression / Assessment and Plan / ED Course  I have reviewed the triage vital signs and the nursing notes.  Pertinent labs & imaging results that were available during my care of the patient were reviewed by me and considered in my medical decision making (see chart for details). Labs including chemistries CBC troponin are all unremarkable.  CT the head shows no acute changes.  Patient has worsening Alzheimer dementia.  He will follow-up with his PCP      Final Clinical Impressions(s) / ED Diagnoses   Final diagnoses:  Vascular dementia without behavioral disturbance    ED Discharge Orders    None       Milton Ferguson, MD 10/28/17 2008

## 2017-10-28 NOTE — ED Triage Notes (Signed)
Wife states pt has alzheimers and went to [facility] Friday for AMS. Pt wife states has had weakness, not talking as much and not making sense  As his usual. Not going to bathroom as usual. Had UA done Friday but was clear. Pt quiet in triage. Pt smile symmetrical and no arm drift.

## 2017-10-28 NOTE — ED Notes (Signed)
Patient to CT.

## 2017-10-28 NOTE — Discharge Instructions (Addendum)
Follow-up with your doctor in a week for recheck °

## 2017-12-09 ENCOUNTER — Other Ambulatory Visit (HOSPITAL_COMMUNITY): Payer: Self-pay | Admitting: Family

## 2017-12-09 DIAGNOSIS — M79662 Pain in left lower leg: Secondary | ICD-10-CM | POA: Diagnosis not present

## 2017-12-09 DIAGNOSIS — M25562 Pain in left knee: Secondary | ICD-10-CM | POA: Diagnosis not present

## 2017-12-13 ENCOUNTER — Other Ambulatory Visit (HOSPITAL_COMMUNITY): Payer: Self-pay | Admitting: Family

## 2017-12-13 ENCOUNTER — Encounter (HOSPITAL_COMMUNITY): Payer: Self-pay

## 2017-12-13 ENCOUNTER — Ambulatory Visit (HOSPITAL_COMMUNITY)
Admission: RE | Admit: 2017-12-13 | Discharge: 2017-12-13 | Disposition: A | Discharge status: Home / Self Care | Payer: PPO | Source: Ambulatory Visit | Attending: Family | Admitting: Family

## 2017-12-13 DIAGNOSIS — R600 Localized edema: Secondary | ICD-10-CM | POA: Diagnosis not present

## 2017-12-13 DIAGNOSIS — M25562 Pain in left knee: Secondary | ICD-10-CM

## 2017-12-13 DIAGNOSIS — M1712 Unilateral primary osteoarthritis, left knee: Secondary | ICD-10-CM | POA: Diagnosis not present

## 2017-12-13 DIAGNOSIS — M11262 Other chondrocalcinosis, left knee: Secondary | ICD-10-CM | POA: Diagnosis not present

## 2017-12-13 DIAGNOSIS — M79662 Pain in left lower leg: Secondary | ICD-10-CM | POA: Diagnosis not present

## 2018-01-14 DIAGNOSIS — B351 Tinea unguium: Secondary | ICD-10-CM | POA: Diagnosis not present

## 2018-01-14 DIAGNOSIS — M79676 Pain in unspecified toe(s): Secondary | ICD-10-CM | POA: Diagnosis not present

## 2018-03-14 DIAGNOSIS — M6281 Muscle weakness (generalized): Secondary | ICD-10-CM | POA: Diagnosis not present

## 2018-03-14 DIAGNOSIS — G301 Alzheimer's disease with late onset: Secondary | ICD-10-CM | POA: Diagnosis not present

## 2018-03-14 DIAGNOSIS — R4189 Other symptoms and signs involving cognitive functions and awareness: Secondary | ICD-10-CM | POA: Diagnosis not present

## 2018-03-17 DIAGNOSIS — K5900 Constipation, unspecified: Secondary | ICD-10-CM | POA: Diagnosis not present

## 2018-03-18 DIAGNOSIS — I10 Essential (primary) hypertension: Secondary | ICD-10-CM | POA: Diagnosis not present

## 2018-03-18 DIAGNOSIS — J449 Chronic obstructive pulmonary disease, unspecified: Secondary | ICD-10-CM | POA: Diagnosis not present

## 2018-03-18 DIAGNOSIS — G4700 Insomnia, unspecified: Secondary | ICD-10-CM | POA: Diagnosis not present

## 2018-03-18 DIAGNOSIS — K219 Gastro-esophageal reflux disease without esophagitis: Secondary | ICD-10-CM | POA: Diagnosis not present

## 2018-03-18 DIAGNOSIS — E782 Mixed hyperlipidemia: Secondary | ICD-10-CM | POA: Diagnosis not present

## 2018-03-18 DIAGNOSIS — R1032 Left lower quadrant pain: Secondary | ICD-10-CM | POA: Diagnosis not present

## 2018-03-27 DIAGNOSIS — I2584 Coronary atherosclerosis due to calcified coronary lesion: Secondary | ICD-10-CM | POA: Diagnosis not present

## 2018-03-27 DIAGNOSIS — J439 Emphysema, unspecified: Secondary | ICD-10-CM | POA: Diagnosis not present

## 2018-03-27 DIAGNOSIS — I10 Essential (primary) hypertension: Secondary | ICD-10-CM | POA: Diagnosis not present

## 2018-03-27 DIAGNOSIS — K219 Gastro-esophageal reflux disease without esophagitis: Secondary | ICD-10-CM | POA: Diagnosis not present

## 2018-03-27 DIAGNOSIS — J449 Chronic obstructive pulmonary disease, unspecified: Secondary | ICD-10-CM | POA: Diagnosis not present

## 2018-03-27 DIAGNOSIS — E782 Mixed hyperlipidemia: Secondary | ICD-10-CM | POA: Diagnosis not present

## 2018-03-27 DIAGNOSIS — G301 Alzheimer's disease with late onset: Secondary | ICD-10-CM | POA: Diagnosis not present

## 2018-04-02 DIAGNOSIS — D649 Anemia, unspecified: Secondary | ICD-10-CM | POA: Diagnosis not present

## 2018-04-02 DIAGNOSIS — Z79899 Other long term (current) drug therapy: Secondary | ICD-10-CM | POA: Diagnosis not present

## 2018-04-06 DIAGNOSIS — G301 Alzheimer's disease with late onset: Secondary | ICD-10-CM | POA: Diagnosis not present

## 2018-04-06 DIAGNOSIS — M6281 Muscle weakness (generalized): Secondary | ICD-10-CM | POA: Diagnosis not present

## 2018-04-06 DIAGNOSIS — R4189 Other symptoms and signs involving cognitive functions and awareness: Secondary | ICD-10-CM | POA: Diagnosis not present

## 2018-04-09 DIAGNOSIS — K219 Gastro-esophageal reflux disease without esophagitis: Secondary | ICD-10-CM | POA: Diagnosis not present

## 2018-04-09 DIAGNOSIS — J439 Emphysema, unspecified: Secondary | ICD-10-CM | POA: Diagnosis not present

## 2018-04-09 DIAGNOSIS — J449 Chronic obstructive pulmonary disease, unspecified: Secondary | ICD-10-CM | POA: Diagnosis not present

## 2018-04-09 DIAGNOSIS — E782 Mixed hyperlipidemia: Secondary | ICD-10-CM | POA: Diagnosis not present

## 2018-04-09 DIAGNOSIS — I2584 Coronary atherosclerosis due to calcified coronary lesion: Secondary | ICD-10-CM | POA: Diagnosis not present

## 2018-04-09 DIAGNOSIS — G301 Alzheimer's disease with late onset: Secondary | ICD-10-CM | POA: Diagnosis not present

## 2018-04-09 DIAGNOSIS — I10 Essential (primary) hypertension: Secondary | ICD-10-CM | POA: Diagnosis not present

## 2018-04-25 ENCOUNTER — Emergency Department (HOSPITAL_COMMUNITY): Payer: PPO

## 2018-04-25 ENCOUNTER — Encounter (HOSPITAL_COMMUNITY): Payer: Self-pay | Admitting: *Deleted

## 2018-04-25 ENCOUNTER — Emergency Department (HOSPITAL_COMMUNITY)
Admission: EM | Admit: 2018-04-25 | Discharge: 2018-04-25 | Disposition: A | Discharge status: Home / Self Care | Payer: PPO | Attending: Emergency Medicine | Admitting: Emergency Medicine

## 2018-04-25 ENCOUNTER — Other Ambulatory Visit: Payer: Self-pay

## 2018-04-25 DIAGNOSIS — R4182 Altered mental status, unspecified: Secondary | ICD-10-CM | POA: Diagnosis not present

## 2018-04-25 DIAGNOSIS — R404 Transient alteration of awareness: Secondary | ICD-10-CM | POA: Diagnosis not present

## 2018-04-25 DIAGNOSIS — G309 Alzheimer's disease, unspecified: Secondary | ICD-10-CM | POA: Diagnosis not present

## 2018-04-25 DIAGNOSIS — Z87891 Personal history of nicotine dependence: Secondary | ICD-10-CM | POA: Insufficient documentation

## 2018-04-25 DIAGNOSIS — R0902 Hypoxemia: Secondary | ICD-10-CM | POA: Diagnosis not present

## 2018-04-25 DIAGNOSIS — F0390 Unspecified dementia without behavioral disturbance: Secondary | ICD-10-CM | POA: Insufficient documentation

## 2018-04-25 DIAGNOSIS — R410 Disorientation, unspecified: Secondary | ICD-10-CM | POA: Diagnosis not present

## 2018-04-25 LAB — TROPONIN I: Troponin I: <0.03 ng/mL (ref <0.03)

## 2018-04-25 LAB — URINALYSIS, ROUTINE W REFLEX MICROSCOPIC
Bilirubin Urine: NEGATIVE
GLUCOSE, UA: NEGATIVE mg/dL
Hgb urine dipstick: NEGATIVE
Ketones, ur: NEGATIVE mg/dL
LEUKOCYTES UA: NEGATIVE
NITRITE: NEGATIVE
PROTEIN: NEGATIVE mg/dL
Specific Gravity, Urine: 1.011 (ref 1.005–1.030)
pH: 7 (ref 5.0–8.0)

## 2018-04-25 LAB — CBC WITH DIFFERENTIAL/PLATELET
BASOS ABS: 0.1 10*3/uL (ref 0.0–0.1)
Basophils Relative: 1 %
Eosinophils Absolute: 0.1 10*3/uL (ref 0.0–0.7)
Eosinophils Relative: 2 %
HCT: 46.1 % (ref 39.0–52.0)
HEMOGLOBIN: 15.1 g/dL (ref 13.0–17.0)
LYMPHS ABS: 1.4 10*3/uL (ref 0.7–4.0)
LYMPHS PCT: 25 %
MCH: 29.2 pg (ref 26.0–34.0)
MCHC: 32.8 g/dL (ref 30.0–36.0)
MCV: 89 fL (ref 78.0–100.0)
Monocytes Absolute: 0.8 10*3/uL (ref 0.1–1.0)
Monocytes Relative: 15 %
NEUTROS ABS: 3.2 10*3/uL (ref 1.7–7.7)
NEUTROS PCT: 57 %
Platelets: 157 10*3/uL (ref 150–400)
RBC: 5.18 MIL/uL (ref 4.22–5.81)
RDW: 14.6 % (ref 11.5–15.5)
WBC: 5.5 10*3/uL (ref 4.0–10.5)

## 2018-04-25 LAB — COMPREHENSIVE METABOLIC PANEL
ALT: 16 U/L (ref 0–44)
ANION GAP: 5 (ref 5–15)
AST: 17 U/L (ref 15–41)
Albumin: 4 g/dL (ref 3.5–5.0)
Alkaline Phosphatase: 69 U/L (ref 38–126)
BUN: 21 mg/dL (ref 8–23)
CHLORIDE: 105 mmol/L (ref 98–111)
CO2: 29 mmol/L (ref 22–32)
Calcium: 9 mg/dL (ref 8.9–10.3)
Creatinine, Ser: 1.06 mg/dL (ref 0.61–1.24)
GFR calc non Af Amer: >60 mL/min (ref >60)
Glucose, Bld: 96 mg/dL (ref 70–99)
POTASSIUM: 4.3 mmol/L (ref 3.5–5.1)
SODIUM: 139 mmol/L (ref 135–145)
Total Bilirubin: 0.3 mg/dL (ref 0.3–1.2)
Total Protein: 7.1 g/dL (ref 6.5–8.1)

## 2018-04-25 NOTE — ED Notes (Signed)
Patient provided with sprite for PO fluid challenge.

## 2018-04-25 NOTE — Discharge Instructions (Addendum)
Take your usual prescriptions as previously directed.  Call your regular medical doctor tomorrow to schedule a follow up appointment within the next 3 days.  Return to the Emergency Department immediately sooner if worsening.
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
## 2018-04-25 NOTE — ED Notes (Signed)
Patient tolerating PO fluids 
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
## 2018-04-25 NOTE — ED Notes (Signed)
Pt states he does not need to urinate at this time, aware of DO, pt given urinal and call light

## 2018-04-25 NOTE — ED Notes (Signed)
Patient's wife contacted to make aware he is in the emergency room, she will arrive as soon as possible.

## 2018-04-26 LAB — URINE CULTURE: CULTURE: NO GROWTH

## 2018-05-09 DIAGNOSIS — J449 Chronic obstructive pulmonary disease, unspecified: Secondary | ICD-10-CM | POA: Diagnosis not present

## 2018-05-09 DIAGNOSIS — E782 Mixed hyperlipidemia: Secondary | ICD-10-CM | POA: Diagnosis not present

## 2018-05-09 DIAGNOSIS — K219 Gastro-esophageal reflux disease without esophagitis: Secondary | ICD-10-CM | POA: Diagnosis not present

## 2018-05-09 DIAGNOSIS — I10 Essential (primary) hypertension: Secondary | ICD-10-CM | POA: Diagnosis not present

## 2018-05-09 DIAGNOSIS — G4700 Insomnia, unspecified: Secondary | ICD-10-CM | POA: Diagnosis not present

## 2018-05-20 DIAGNOSIS — F39 Unspecified mood [affective] disorder: Secondary | ICD-10-CM | POA: Diagnosis not present

## 2018-05-20 DIAGNOSIS — F0281 Dementia in other diseases classified elsewhere with behavioral disturbance: Secondary | ICD-10-CM | POA: Diagnosis not present

## 2018-05-20 DIAGNOSIS — I739 Peripheral vascular disease, unspecified: Secondary | ICD-10-CM | POA: Diagnosis not present

## 2018-05-20 DIAGNOSIS — I2584 Coronary atherosclerosis due to calcified coronary lesion: Secondary | ICD-10-CM | POA: Diagnosis not present

## 2018-05-23 DIAGNOSIS — G301 Alzheimer's disease with late onset: Secondary | ICD-10-CM | POA: Diagnosis not present

## 2018-05-23 DIAGNOSIS — R4189 Other symptoms and signs involving cognitive functions and awareness: Secondary | ICD-10-CM | POA: Diagnosis not present

## 2018-05-23 DIAGNOSIS — M6281 Muscle weakness (generalized): Secondary | ICD-10-CM | POA: Diagnosis not present

## 2018-06-13 DIAGNOSIS — F39 Unspecified mood [affective] disorder: Secondary | ICD-10-CM | POA: Diagnosis not present

## 2018-06-13 DIAGNOSIS — I739 Peripheral vascular disease, unspecified: Secondary | ICD-10-CM | POA: Diagnosis not present

## 2018-06-13 DIAGNOSIS — I2584 Coronary atherosclerosis due to calcified coronary lesion: Secondary | ICD-10-CM | POA: Diagnosis not present

## 2018-06-13 DIAGNOSIS — F0281 Dementia in other diseases classified elsewhere with behavioral disturbance: Secondary | ICD-10-CM | POA: Diagnosis not present

## 2018-06-24 DIAGNOSIS — J439 Emphysema, unspecified: Secondary | ICD-10-CM | POA: Diagnosis not present

## 2018-06-24 DIAGNOSIS — F0281 Dementia in other diseases classified elsewhere with behavioral disturbance: Secondary | ICD-10-CM | POA: Diagnosis not present

## 2018-06-24 DIAGNOSIS — K219 Gastro-esophageal reflux disease without esophagitis: Secondary | ICD-10-CM | POA: Diagnosis not present

## 2018-06-24 DIAGNOSIS — G4700 Insomnia, unspecified: Secondary | ICD-10-CM | POA: Diagnosis not present

## 2018-07-02 DIAGNOSIS — M5136 Other intervertebral disc degeneration, lumbar region: Secondary | ICD-10-CM | POA: Diagnosis not present

## 2018-07-02 DIAGNOSIS — F0281 Dementia in other diseases classified elsewhere with behavioral disturbance: Secondary | ICD-10-CM | POA: Diagnosis not present

## 2018-07-02 DIAGNOSIS — R040 Epistaxis: Secondary | ICD-10-CM | POA: Diagnosis not present

## 2018-07-02 DIAGNOSIS — R51 Headache: Secondary | ICD-10-CM | POA: Diagnosis not present

## 2018-07-15 DIAGNOSIS — M79676 Pain in unspecified toe(s): Secondary | ICD-10-CM | POA: Diagnosis not present

## 2018-07-15 DIAGNOSIS — B351 Tinea unguium: Secondary | ICD-10-CM | POA: Diagnosis not present

## 2018-08-09 DIAGNOSIS — R05 Cough: Secondary | ICD-10-CM | POA: Diagnosis not present

## 2018-08-09 DIAGNOSIS — R0989 Other specified symptoms and signs involving the circulatory and respiratory systems: Secondary | ICD-10-CM | POA: Diagnosis not present

## 2018-08-09 DIAGNOSIS — R509 Fever, unspecified: Secondary | ICD-10-CM | POA: Diagnosis not present

## 2018-08-18 DIAGNOSIS — D649 Anemia, unspecified: Secondary | ICD-10-CM | POA: Diagnosis not present

## 2018-08-18 DIAGNOSIS — Z79899 Other long term (current) drug therapy: Secondary | ICD-10-CM | POA: Diagnosis not present

## 2018-08-18 DIAGNOSIS — E7800 Pure hypercholesterolemia, unspecified: Secondary | ICD-10-CM | POA: Diagnosis not present

## 2018-08-20 DIAGNOSIS — I10 Essential (primary) hypertension: Secondary | ICD-10-CM | POA: Diagnosis not present

## 2018-08-20 DIAGNOSIS — F0281 Dementia in other diseases classified elsewhere with behavioral disturbance: Secondary | ICD-10-CM | POA: Diagnosis not present

## 2018-08-20 DIAGNOSIS — F39 Unspecified mood [affective] disorder: Secondary | ICD-10-CM | POA: Diagnosis not present

## 2018-08-20 DIAGNOSIS — M5136 Other intervertebral disc degeneration, lumbar region: Secondary | ICD-10-CM | POA: Diagnosis not present

## 2018-09-05 DIAGNOSIS — G301 Alzheimer's disease with late onset: Secondary | ICD-10-CM | POA: Diagnosis not present

## 2018-09-05 DIAGNOSIS — M6281 Muscle weakness (generalized): Secondary | ICD-10-CM | POA: Diagnosis not present

## 2018-09-08 DIAGNOSIS — M6281 Muscle weakness (generalized): Secondary | ICD-10-CM | POA: Diagnosis not present

## 2018-09-08 DIAGNOSIS — I739 Peripheral vascular disease, unspecified: Secondary | ICD-10-CM | POA: Diagnosis not present

## 2018-09-08 DIAGNOSIS — F0281 Dementia in other diseases classified elsewhere with behavioral disturbance: Secondary | ICD-10-CM | POA: Diagnosis not present

## 2018-09-08 DIAGNOSIS — G301 Alzheimer's disease with late onset: Secondary | ICD-10-CM | POA: Diagnosis not present

## 2018-09-08 DIAGNOSIS — I2584 Coronary atherosclerosis due to calcified coronary lesion: Secondary | ICD-10-CM | POA: Diagnosis not present

## 2018-09-08 DIAGNOSIS — F39 Unspecified mood [affective] disorder: Secondary | ICD-10-CM | POA: Diagnosis not present

## 2018-09-10 DIAGNOSIS — R269 Unspecified abnormalities of gait and mobility: Secondary | ICD-10-CM | POA: Diagnosis not present

## 2018-09-10 DIAGNOSIS — F0281 Dementia in other diseases classified elsewhere with behavioral disturbance: Secondary | ICD-10-CM | POA: Diagnosis not present

## 2018-09-10 DIAGNOSIS — M1990 Unspecified osteoarthritis, unspecified site: Secondary | ICD-10-CM | POA: Diagnosis not present

## 2018-09-10 DIAGNOSIS — J449 Chronic obstructive pulmonary disease, unspecified: Secondary | ICD-10-CM | POA: Diagnosis not present

## 2018-10-03 DIAGNOSIS — Z79899 Other long term (current) drug therapy: Secondary | ICD-10-CM | POA: Diagnosis not present

## 2018-10-03 DIAGNOSIS — Q2546 Tortuous aortic arch: Secondary | ICD-10-CM | POA: Diagnosis not present

## 2018-10-03 DIAGNOSIS — D649 Anemia, unspecified: Secondary | ICD-10-CM | POA: Diagnosis not present

## 2018-10-03 DIAGNOSIS — J9811 Atelectasis: Secondary | ICD-10-CM | POA: Diagnosis not present

## 2018-10-06 DIAGNOSIS — M6281 Muscle weakness (generalized): Secondary | ICD-10-CM | POA: Diagnosis not present

## 2018-10-06 DIAGNOSIS — G301 Alzheimer's disease with late onset: Secondary | ICD-10-CM | POA: Diagnosis not present

## 2018-10-13 DIAGNOSIS — J9811 Atelectasis: Secondary | ICD-10-CM | POA: Diagnosis not present

## 2018-10-13 DIAGNOSIS — R0989 Other specified symptoms and signs involving the circulatory and respiratory systems: Secondary | ICD-10-CM | POA: Diagnosis not present

## 2018-10-19 DIAGNOSIS — R269 Unspecified abnormalities of gait and mobility: Secondary | ICD-10-CM | POA: Diagnosis not present

## 2018-10-19 DIAGNOSIS — R000 Tachycardia, unspecified: Secondary | ICD-10-CM | POA: Diagnosis not present

## 2018-10-19 DIAGNOSIS — F0281 Dementia in other diseases classified elsewhere with behavioral disturbance: Secondary | ICD-10-CM | POA: Diagnosis not present

## 2018-10-19 DIAGNOSIS — F39 Unspecified mood [affective] disorder: Secondary | ICD-10-CM | POA: Diagnosis not present

## 2018-10-20 DIAGNOSIS — Z79899 Other long term (current) drug therapy: Secondary | ICD-10-CM | POA: Diagnosis not present

## 2018-10-20 DIAGNOSIS — D649 Anemia, unspecified: Secondary | ICD-10-CM | POA: Diagnosis not present

## 2018-10-20 DIAGNOSIS — K567 Ileus, unspecified: Secondary | ICD-10-CM | POA: Diagnosis not present

## 2018-10-20 DIAGNOSIS — A498 Other bacterial infections of unspecified site: Secondary | ICD-10-CM | POA: Diagnosis not present

## 2018-10-21 DIAGNOSIS — F0281 Dementia in other diseases classified elsewhere with behavioral disturbance: Secondary | ICD-10-CM | POA: Diagnosis not present

## 2018-10-21 DIAGNOSIS — F39 Unspecified mood [affective] disorder: Secondary | ICD-10-CM | POA: Diagnosis not present

## 2018-10-21 DIAGNOSIS — R269 Unspecified abnormalities of gait and mobility: Secondary | ICD-10-CM | POA: Diagnosis not present

## 2018-10-29 DIAGNOSIS — K219 Gastro-esophageal reflux disease without esophagitis: Secondary | ICD-10-CM | POA: Diagnosis not present

## 2018-11-05 DIAGNOSIS — G301 Alzheimer's disease with late onset: Secondary | ICD-10-CM | POA: Diagnosis not present

## 2018-11-05 DIAGNOSIS — M6281 Muscle weakness (generalized): Secondary | ICD-10-CM | POA: Diagnosis not present

## 2018-11-05 DIAGNOSIS — K219 Gastro-esophageal reflux disease without esophagitis: Secondary | ICD-10-CM | POA: Diagnosis not present

## 2018-11-05 DIAGNOSIS — R278 Other lack of coordination: Secondary | ICD-10-CM | POA: Diagnosis not present

## 2018-12-03 DIAGNOSIS — I480 Paroxysmal atrial fibrillation: Secondary | ICD-10-CM | POA: Diagnosis not present

## 2018-12-03 DIAGNOSIS — F0281 Dementia in other diseases classified elsewhere with behavioral disturbance: Secondary | ICD-10-CM | POA: Diagnosis not present

## 2018-12-03 DIAGNOSIS — F39 Unspecified mood [affective] disorder: Secondary | ICD-10-CM | POA: Diagnosis not present

## 2018-12-03 DIAGNOSIS — R269 Unspecified abnormalities of gait and mobility: Secondary | ICD-10-CM | POA: Diagnosis not present

## 2018-12-19 DIAGNOSIS — F39 Unspecified mood [affective] disorder: Secondary | ICD-10-CM | POA: Diagnosis not present

## 2018-12-19 DIAGNOSIS — R634 Abnormal weight loss: Secondary | ICD-10-CM | POA: Diagnosis not present

## 2018-12-19 DIAGNOSIS — F0281 Dementia in other diseases classified elsewhere with behavioral disturbance: Secondary | ICD-10-CM | POA: Diagnosis not present

## 2018-12-19 DIAGNOSIS — F418 Other specified anxiety disorders: Secondary | ICD-10-CM | POA: Diagnosis not present

## 2019-01-05 DIAGNOSIS — Z20828 Contact with and (suspected) exposure to other viral communicable diseases: Secondary | ICD-10-CM | POA: Diagnosis not present

## 2019-01-05 DIAGNOSIS — Z1383 Encounter for screening for respiratory disorder NEC: Secondary | ICD-10-CM | POA: Diagnosis not present

## 2019-01-07 DIAGNOSIS — F3341 Major depressive disorder, recurrent, in partial remission: Secondary | ICD-10-CM | POA: Diagnosis not present

## 2019-01-07 DIAGNOSIS — F0151 Vascular dementia with behavioral disturbance: Secondary | ICD-10-CM | POA: Diagnosis not present

## 2019-01-07 DIAGNOSIS — F064 Anxiety disorder due to known physiological condition: Secondary | ICD-10-CM | POA: Diagnosis not present

## 2019-01-07 DIAGNOSIS — G4701 Insomnia due to medical condition: Secondary | ICD-10-CM | POA: Diagnosis not present

## 2019-01-07 DIAGNOSIS — F062 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with delusions due to known physiological condition: Secondary | ICD-10-CM | POA: Diagnosis not present

## 2019-01-09 DIAGNOSIS — R634 Abnormal weight loss: Secondary | ICD-10-CM | POA: Diagnosis not present

## 2019-01-09 DIAGNOSIS — J439 Emphysema, unspecified: Secondary | ICD-10-CM | POA: Diagnosis not present

## 2019-01-09 DIAGNOSIS — F0391 Unspecified dementia with behavioral disturbance: Secondary | ICD-10-CM | POA: Diagnosis not present

## 2019-01-09 DIAGNOSIS — I252 Old myocardial infarction: Secondary | ICD-10-CM | POA: Diagnosis not present

## 2019-01-21 DIAGNOSIS — F0151 Vascular dementia with behavioral disturbance: Secondary | ICD-10-CM | POA: Diagnosis not present

## 2019-01-21 DIAGNOSIS — F3341 Major depressive disorder, recurrent, in partial remission: Secondary | ICD-10-CM | POA: Diagnosis not present

## 2019-01-21 DIAGNOSIS — F062 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with delusions due to known physiological condition: Secondary | ICD-10-CM | POA: Diagnosis not present

## 2019-01-21 DIAGNOSIS — G4701 Insomnia due to medical condition: Secondary | ICD-10-CM | POA: Diagnosis not present

## 2019-01-21 DIAGNOSIS — F064 Anxiety disorder due to known physiological condition: Secondary | ICD-10-CM | POA: Diagnosis not present

## 2019-02-04 DIAGNOSIS — R634 Abnormal weight loss: Secondary | ICD-10-CM | POA: Diagnosis not present

## 2019-02-04 DIAGNOSIS — F0391 Unspecified dementia with behavioral disturbance: Secondary | ICD-10-CM | POA: Diagnosis not present

## 2019-02-04 DIAGNOSIS — J439 Emphysema, unspecified: Secondary | ICD-10-CM | POA: Diagnosis not present

## 2019-02-04 DIAGNOSIS — I480 Paroxysmal atrial fibrillation: Secondary | ICD-10-CM | POA: Diagnosis not present

## 2019-02-04 DIAGNOSIS — N400 Enlarged prostate without lower urinary tract symptoms: Secondary | ICD-10-CM | POA: Diagnosis not present

## 2019-02-04 DIAGNOSIS — F329 Major depressive disorder, single episode, unspecified: Secondary | ICD-10-CM | POA: Diagnosis not present

## 2019-02-05 DIAGNOSIS — Z1383 Encounter for screening for respiratory disorder NEC: Secondary | ICD-10-CM | POA: Diagnosis not present

## 2019-02-05 DIAGNOSIS — Z20828 Contact with and (suspected) exposure to other viral communicable diseases: Secondary | ICD-10-CM | POA: Diagnosis not present

## 2019-02-06 DIAGNOSIS — G301 Alzheimer's disease with late onset: Secondary | ICD-10-CM | POA: Diagnosis not present

## 2019-02-19 DIAGNOSIS — G301 Alzheimer's disease with late onset: Secondary | ICD-10-CM | POA: Diagnosis not present

## 2019-02-19 DIAGNOSIS — R1311 Dysphagia, oral phase: Secondary | ICD-10-CM | POA: Diagnosis not present

## 2019-02-25 DIAGNOSIS — F0151 Vascular dementia with behavioral disturbance: Secondary | ICD-10-CM | POA: Diagnosis not present

## 2019-02-25 DIAGNOSIS — F062 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with delusions due to known physiological condition: Secondary | ICD-10-CM | POA: Diagnosis not present

## 2019-02-25 DIAGNOSIS — F3341 Major depressive disorder, recurrent, in partial remission: Secondary | ICD-10-CM | POA: Diagnosis not present

## 2019-02-27 DIAGNOSIS — N390 Urinary tract infection, site not specified: Secondary | ICD-10-CM | POA: Diagnosis not present

## 2019-03-02 DIAGNOSIS — Z79899 Other long term (current) drug therapy: Secondary | ICD-10-CM | POA: Diagnosis not present

## 2019-03-02 DIAGNOSIS — D649 Anemia, unspecified: Secondary | ICD-10-CM | POA: Diagnosis not present

## 2019-03-05 DIAGNOSIS — Z79899 Other long term (current) drug therapy: Secondary | ICD-10-CM | POA: Diagnosis not present

## 2019-03-05 DIAGNOSIS — E7800 Pure hypercholesterolemia, unspecified: Secondary | ICD-10-CM | POA: Diagnosis not present

## 2019-03-05 DIAGNOSIS — D649 Anemia, unspecified: Secondary | ICD-10-CM | POA: Diagnosis not present

## 2019-03-09 DIAGNOSIS — G301 Alzheimer's disease with late onset: Secondary | ICD-10-CM | POA: Diagnosis not present

## 2019-03-09 DIAGNOSIS — R1311 Dysphagia, oral phase: Secondary | ICD-10-CM | POA: Diagnosis not present

## 2019-03-11 DIAGNOSIS — F419 Anxiety disorder, unspecified: Secondary | ICD-10-CM | POA: Diagnosis not present

## 2019-03-11 DIAGNOSIS — F0391 Unspecified dementia with behavioral disturbance: Secondary | ICD-10-CM | POA: Diagnosis not present

## 2019-03-11 DIAGNOSIS — J439 Emphysema, unspecified: Secondary | ICD-10-CM | POA: Diagnosis not present

## 2019-03-11 DIAGNOSIS — F329 Major depressive disorder, single episode, unspecified: Secondary | ICD-10-CM | POA: Diagnosis not present

## 2019-03-11 DIAGNOSIS — I480 Paroxysmal atrial fibrillation: Secondary | ICD-10-CM | POA: Diagnosis not present

## 2019-03-11 DIAGNOSIS — Z1383 Encounter for screening for respiratory disorder NEC: Secondary | ICD-10-CM | POA: Diagnosis not present

## 2019-03-11 DIAGNOSIS — N400 Enlarged prostate without lower urinary tract symptoms: Secondary | ICD-10-CM | POA: Diagnosis not present

## 2019-03-19 DIAGNOSIS — Z20828 Contact with and (suspected) exposure to other viral communicable diseases: Secondary | ICD-10-CM | POA: Diagnosis not present

## 2019-03-19 DIAGNOSIS — Z1383 Encounter for screening for respiratory disorder NEC: Secondary | ICD-10-CM | POA: Diagnosis not present

## 2019-03-24 DIAGNOSIS — Z20828 Contact with and (suspected) exposure to other viral communicable diseases: Secondary | ICD-10-CM | POA: Diagnosis not present

## 2019-03-26 DIAGNOSIS — U071 COVID-19: Secondary | ICD-10-CM | POA: Diagnosis not present

## 2019-03-26 DIAGNOSIS — F0391 Unspecified dementia with behavioral disturbance: Secondary | ICD-10-CM | POA: Diagnosis not present

## 2019-03-26 DIAGNOSIS — J439 Emphysema, unspecified: Secondary | ICD-10-CM | POA: Diagnosis not present

## 2019-03-26 DIAGNOSIS — I480 Paroxysmal atrial fibrillation: Secondary | ICD-10-CM | POA: Diagnosis not present

## 2019-03-30 DIAGNOSIS — Z1383 Encounter for screening for respiratory disorder NEC: Secondary | ICD-10-CM | POA: Diagnosis not present

## 2019-03-30 DIAGNOSIS — Z20828 Contact with and (suspected) exposure to other viral communicable diseases: Secondary | ICD-10-CM | POA: Diagnosis not present

## 2019-04-06 DIAGNOSIS — Z20828 Contact with and (suspected) exposure to other viral communicable diseases: Secondary | ICD-10-CM | POA: Diagnosis not present

## 2019-04-06 DIAGNOSIS — Z1383 Encounter for screening for respiratory disorder NEC: Secondary | ICD-10-CM | POA: Diagnosis not present

## 2019-04-16 DIAGNOSIS — F3341 Major depressive disorder, recurrent, in partial remission: Secondary | ICD-10-CM | POA: Diagnosis not present

## 2019-04-16 DIAGNOSIS — F0151 Vascular dementia with behavioral disturbance: Secondary | ICD-10-CM | POA: Diagnosis not present

## 2019-04-16 DIAGNOSIS — F062 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with delusions due to known physiological condition: Secondary | ICD-10-CM | POA: Diagnosis not present

## 2019-04-21 DIAGNOSIS — I480 Paroxysmal atrial fibrillation: Secondary | ICD-10-CM | POA: Diagnosis not present

## 2019-04-21 DIAGNOSIS — F0391 Unspecified dementia with behavioral disturbance: Secondary | ICD-10-CM | POA: Diagnosis not present

## 2019-04-21 DIAGNOSIS — J439 Emphysema, unspecified: Secondary | ICD-10-CM | POA: Diagnosis not present

## 2019-04-21 DIAGNOSIS — U071 COVID-19: Secondary | ICD-10-CM | POA: Diagnosis not present

## 2019-04-27 DIAGNOSIS — F0391 Unspecified dementia with behavioral disturbance: Secondary | ICD-10-CM | POA: Diagnosis not present

## 2019-04-27 DIAGNOSIS — U071 COVID-19: Secondary | ICD-10-CM | POA: Diagnosis not present

## 2019-04-27 DIAGNOSIS — J439 Emphysema, unspecified: Secondary | ICD-10-CM | POA: Diagnosis not present

## 2019-04-27 DIAGNOSIS — R634 Abnormal weight loss: Secondary | ICD-10-CM | POA: Diagnosis not present

## 2019-05-01 ENCOUNTER — Encounter (HOSPITAL_COMMUNITY): Payer: Self-pay | Admitting: *Deleted

## 2019-05-01 ENCOUNTER — Emergency Department (HOSPITAL_COMMUNITY): Payer: PPO

## 2019-05-01 ENCOUNTER — Other Ambulatory Visit: Payer: Self-pay

## 2019-05-01 ENCOUNTER — Emergency Department (HOSPITAL_COMMUNITY)
Admission: EM | Admit: 2019-05-01 | Discharge: 2019-05-01 | Disposition: A | Discharge status: Home / Self Care | Payer: PPO | Attending: Emergency Medicine | Admitting: Emergency Medicine

## 2019-05-01 DIAGNOSIS — S299XXA Unspecified injury of thorax, initial encounter: Secondary | ICD-10-CM | POA: Diagnosis not present

## 2019-05-01 DIAGNOSIS — Y939 Activity, unspecified: Secondary | ICD-10-CM | POA: Diagnosis not present

## 2019-05-01 DIAGNOSIS — Z87891 Personal history of nicotine dependence: Secondary | ICD-10-CM | POA: Diagnosis not present

## 2019-05-01 DIAGNOSIS — S01111A Laceration without foreign body of right eyelid and periocular area, initial encounter: Secondary | ICD-10-CM | POA: Diagnosis not present

## 2019-05-01 DIAGNOSIS — I252 Old myocardial infarction: Secondary | ICD-10-CM | POA: Insufficient documentation

## 2019-05-01 DIAGNOSIS — J449 Chronic obstructive pulmonary disease, unspecified: Secondary | ICD-10-CM | POA: Diagnosis not present

## 2019-05-01 DIAGNOSIS — W19XXXA Unspecified fall, initial encounter: Secondary | ICD-10-CM | POA: Insufficient documentation

## 2019-05-01 DIAGNOSIS — R0902 Hypoxemia: Secondary | ICD-10-CM | POA: Diagnosis not present

## 2019-05-01 DIAGNOSIS — R531 Weakness: Secondary | ICD-10-CM | POA: Diagnosis not present

## 2019-05-01 DIAGNOSIS — R404 Transient alteration of awareness: Secondary | ICD-10-CM | POA: Diagnosis not present

## 2019-05-01 DIAGNOSIS — I10 Essential (primary) hypertension: Secondary | ICD-10-CM | POA: Diagnosis not present

## 2019-05-01 DIAGNOSIS — R4182 Altered mental status, unspecified: Secondary | ICD-10-CM | POA: Diagnosis not present

## 2019-05-01 DIAGNOSIS — U071 COVID-19: Secondary | ICD-10-CM | POA: Diagnosis not present

## 2019-05-01 DIAGNOSIS — Z79899 Other long term (current) drug therapy: Secondary | ICD-10-CM | POA: Diagnosis not present

## 2019-05-01 DIAGNOSIS — R230 Cyanosis: Secondary | ICD-10-CM | POA: Diagnosis not present

## 2019-05-01 DIAGNOSIS — F0280 Dementia in other diseases classified elsewhere without behavioral disturbance: Secondary | ICD-10-CM | POA: Insufficient documentation

## 2019-05-01 DIAGNOSIS — Z7982 Long term (current) use of aspirin: Secondary | ICD-10-CM | POA: Insufficient documentation

## 2019-05-01 DIAGNOSIS — E872 Acidosis: Secondary | ICD-10-CM

## 2019-05-01 DIAGNOSIS — Y999 Unspecified external cause status: Secondary | ICD-10-CM | POA: Insufficient documentation

## 2019-05-01 DIAGNOSIS — I2510 Atherosclerotic heart disease of native coronary artery without angina pectoris: Secondary | ICD-10-CM | POA: Insufficient documentation

## 2019-05-01 DIAGNOSIS — Z743 Need for continuous supervision: Secondary | ICD-10-CM | POA: Diagnosis not present

## 2019-05-01 DIAGNOSIS — Y92122 Bedroom in nursing home as the place of occurrence of the external cause: Secondary | ICD-10-CM | POA: Insufficient documentation

## 2019-05-01 DIAGNOSIS — Z03818 Encounter for observation for suspected exposure to other biological agents ruled out: Secondary | ICD-10-CM | POA: Diagnosis not present

## 2019-05-01 DIAGNOSIS — R456 Violent behavior: Secondary | ICD-10-CM | POA: Diagnosis not present

## 2019-05-01 DIAGNOSIS — G309 Alzheimer's disease, unspecified: Secondary | ICD-10-CM | POA: Insufficient documentation

## 2019-05-01 DIAGNOSIS — S0990XA Unspecified injury of head, initial encounter: Secondary | ICD-10-CM | POA: Diagnosis present

## 2019-05-01 DIAGNOSIS — R58 Hemorrhage, not elsewhere classified: Secondary | ICD-10-CM | POA: Diagnosis not present

## 2019-05-01 LAB — COMPREHENSIVE METABOLIC PANEL
ALT: 15 U/L (ref 0–44)
AST: 20 U/L (ref 15–41)
Albumin: 3.3 g/dL — ABNORMAL LOW (ref 3.5–5.0)
Alkaline Phosphatase: 73 U/L (ref 38–126)
Anion gap: 12 (ref 5–15)
BUN: 21 mg/dL (ref 8–23)
CO2: 23 mmol/L (ref 22–32)
Calcium: 9.3 mg/dL (ref 8.9–10.3)
Chloride: 101 mmol/L (ref 98–111)
Creatinine, Ser: 0.94 mg/dL (ref 0.61–1.24)
GFR calc Af Amer: >60 mL/min (ref >60)
GFR calc non Af Amer: >60 mL/min (ref >60)
Glucose, Bld: 106 mg/dL — ABNORMAL HIGH (ref 70–99)
Potassium: 4.9 mmol/L (ref 3.5–5.1)
Sodium: 136 mmol/L (ref 135–145)
Total Bilirubin: 0.9 mg/dL (ref 0.3–1.2)
Total Protein: 7.1 g/dL (ref 6.5–8.1)

## 2019-05-01 LAB — PROTIME-INR
INR: 1 (ref 0.8–1.2)
Prothrombin Time: 13 seconds (ref 11.4–15.2)

## 2019-05-01 LAB — URINALYSIS, ROUTINE W REFLEX MICROSCOPIC
Bacteria, UA: NONE SEEN
Bilirubin Urine: NEGATIVE
Glucose, UA: NEGATIVE mg/dL
Ketones, ur: NEGATIVE mg/dL
Leukocytes,Ua: NEGATIVE
Nitrite: NEGATIVE
Protein, ur: NEGATIVE mg/dL
Specific Gravity, Urine: 1.019 (ref 1.005–1.030)
pH: 5 (ref 5.0–8.0)

## 2019-05-01 LAB — CBC WITH DIFFERENTIAL/PLATELET
Abs Immature Granulocytes: 0.03 10*3/uL (ref 0.00–0.07)
Basophils Absolute: 0 10*3/uL (ref 0.0–0.1)
Basophils Relative: 0 %
Eosinophils Absolute: 0 10*3/uL (ref 0.0–0.5)
Eosinophils Relative: 0 %
HCT: 40.3 % (ref 39.0–52.0)
Hemoglobin: 13 g/dL (ref 13.0–17.0)
Immature Granulocytes: 0 %
Lymphocytes Relative: 22 %
Lymphs Abs: 1.6 10*3/uL (ref 0.7–4.0)
MCH: 30.6 pg (ref 26.0–34.0)
MCHC: 32.3 g/dL (ref 30.0–36.0)
MCV: 94.8 fL (ref 80.0–100.0)
Monocytes Absolute: 0.7 10*3/uL (ref 0.1–1.0)
Monocytes Relative: 10 %
Neutro Abs: 4.8 10*3/uL (ref 1.7–7.7)
Neutrophils Relative %: 68 %
Platelets: 205 10*3/uL (ref 150–400)
RBC: 4.25 MIL/uL (ref 4.22–5.81)
RDW: 17.2 % — ABNORMAL HIGH (ref 11.5–15.5)
WBC: 7.2 10*3/uL (ref 4.0–10.5)
nRBC: 0 % (ref 0.0–0.2)

## 2019-05-01 LAB — APTT: aPTT: 33 seconds (ref 24–36)

## 2019-05-01 LAB — LACTIC ACID, PLASMA
Lactic Acid, Venous: 5.1 mmol/L (ref 0.5–1.9)
Lactic Acid, Venous: 1.7 mmol/L (ref 0.5–1.9)

## 2019-05-01 LAB — SARS CORONAVIRUS 2 BY RT PCR (HOSPITAL ORDER, PERFORMED IN ~~LOC~~ HOSPITAL LAB): SARS Coronavirus 2: POSITIVE — AB

## 2019-05-01 MED ORDER — VANCOMYCIN HCL IN DEXTROSE 1-5 GM/200ML-% IV SOLN
1000.0000 mg | Freq: Once | INTRAVENOUS | Status: AC
  Administered 2019-05-01: 11:00:00 1000 mg via INTRAVENOUS
  Filled 2019-05-01: qty 200

## 2019-05-01 MED ORDER — LORAZEPAM 2 MG/ML IJ SOLN
1.0000 mg | INTRAMUSCULAR | Status: DC | PRN
  Filled 2019-05-01: qty 1

## 2019-05-01 MED ORDER — SODIUM CHLORIDE 0.9 % IV BOLUS (SEPSIS)
250.0000 mL | Freq: Once | INTRAVENOUS | Status: AC
  Administered 2019-05-01: 250 mL via INTRAVENOUS

## 2019-05-01 MED ORDER — SODIUM CHLORIDE 0.9 % IV BOLUS (SEPSIS)
500.0000 mL | Freq: Once | INTRAVENOUS | Status: AC
  Administered 2019-05-01: 500 mL via INTRAVENOUS

## 2019-05-01 MED ORDER — SODIUM CHLORIDE 0.9 % IV BOLUS (SEPSIS)
1000.0000 mL | Freq: Once | INTRAVENOUS | Status: AC
  Administered 2019-05-01: 10:00:00 1000 mL via INTRAVENOUS

## 2019-05-01 MED ORDER — SODIUM CHLORIDE 0.9 % IV SOLN
2.0000 g | Freq: Once | INTRAVENOUS | Status: AC
  Administered 2019-05-01: 2 g via INTRAVENOUS
  Filled 2019-05-01: qty 2

## 2019-05-01 MED ORDER — LORAZEPAM 2 MG/ML IJ SOLN
1.0000 mg | INTRAMUSCULAR | Status: DC | PRN
  Administered 2019-05-01: 1 mg via INTRAVENOUS

## 2019-05-01 MED ORDER — LIDOCAINE-EPINEPHRINE (PF) 2 %-1:200000 IJ SOLN
INTRAMUSCULAR | Status: AC
  Filled 2019-05-01: qty 20

## 2019-05-01 MED ORDER — DOXYCYCLINE MONOHYDRATE 100 MG PO TABS: 100.0000 mg | ORAL_TABLET | Freq: Two times a day (BID) | ORAL | 0 refills | Status: AC

## 2019-05-01 MED ORDER — LIDOCAINE-EPINEPHRINE 1 %-1:100000 IJ SOLN
10.0000 mL | Freq: Once | INTRAMUSCULAR | Status: DC
  Filled 2019-05-01: qty 10
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
## 2019-05-01 NOTE — Progress Notes (Signed)
Pharmacy Antibiotic Note  [name] [name] is a 78 y.o. male admitted on 05/01/2019 with pneumonia.  Pharmacy has been consulted for vancomycin and cefepime  Dosing.  Patient is a resident of [facility] and is currently a DNR. Likely, this patient will  be discharged on oral antibiotics and sent back to SNF if CT of his head shows no acute findings.  Plan: Cefepime 2g x1 dose given in ED Vancomycin 1g IV x1 dose given in ED  Weight: 113 lb (51.3 kg)  Temp (24hrs), Avg:95.3 F (35.2 C), Min:95.3 F (35.2 C), Max:95.3 F (35.2 C)  Recent Labs  Lab 05/01/19 0746 05/01/19 0800 05/01/19 1034  WBC 7.2  --   --   CREATININE 0.94  --   --   LATICACIDVEN  --  5.1* 1.7    CrCl cannot be calculated (Unknown ideal weight.).    Allergies  Allergen Reactions  . Namenda [Memantine Hcl] Other (See Comments)    Hallucinations     Antimicrobials this admission: cefepime 9/25 >>   vancomycin 9/25>>    Microbiology results:   9/25 [facility] x2: pending  9/25 MRSA PCR:  pending 9/25 UCx: in progress  Thank you for allowing pharmacy to be a part of this patient's care.  Despina Pole 05/01/2019 3:06 PM

## 2019-05-01 NOTE — ED Notes (Signed)
Date and time results received: 05/01/19 0910 (use smartphrase ".now" to insert current time)  Test: lactic acid Critical Value: 5.1  Name of Provider Notified: Dr. Steffanie Dunn  Orders Received? Or Actions Taken?: new orders

## 2019-05-01 NOTE — ED Notes (Signed)
ED Provider at bedside. 
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
## 2019-05-01 NOTE — Discharge Instructions (Addendum)
Instructions for Mr. Vigna:  The patient can continue to take Tylenol 3 times daily for pain relief. Apply cool ice packs to the patient's right eye 3-4 times daily as he tolerates it for about 15 minutes each time.  The patient is to take Doxycycline 100mg  two times daily for 7 days to treat for shortness of breath and sequelae of COVID.  This patient remains COVID+ and is to be quarantined for at least the next 2 weeks.  PLEASE KEEP A CLOSE EYE ON THIS PATIENT by rooming him near a nurses' station or via tele-monitoring to help prevent additional falls or injuries.  Milus Banister, [place], PGY-2 05/01/2019 1:41 PM

## 2019-05-01 NOTE — ED Provider Notes (Addendum)
ATTENDING SUPERVISORY NOTE I have personally viewed the imaging studies performed, and I was present for key and critical portions of the procedure as documented. I have personally seen and examined the patient, and discussed the plan of care with the resident.  I have reviewed the documentation of the resident and agree.   Laceration of eyelid of right eye without foreign body, initial encounter  .Critical Care Performed by: Elnora Morrison, MD Authorized by: Elnora Morrison, MD   Critical care provider statement:    Critical care time (minutes):  75   Critical care start time:  05/01/2019 8:20 AM   Critical care end time:  05/01/2019 9:35 AM   Critical care time was exclusive of:  Separately billable procedures and treating other patients and teaching time   Critical care was necessary to treat or prevent imminent or life-threatening deterioration of the following conditions:  Sepsis   Critical care was time spent personally by me on the following activities:  Evaluation of patient's response to treatment, examination of patient, ordering and performing treatments and interventions, ordering and review of laboratory studies, ordering and review of radiographic studies, pulse oximetry, re-evaluation of patient's condition, obtaining history from patient or surrogate and review of old charts  .Marland KitchenLaceration Repair  Date/Time: 05/04/2019 9:36 AM Performed by: Elnora Morrison, MD Authorized by: Elnora Morrison, MD   Consent:    Consent obtained:  Emergent situation   Consent given by:  Guardian   Risks discussed:  Infection and pain Anesthesia (see MAR for exact dosages):    Anesthesia method:  Local infiltration   Local anesthetic:  Lidocaine 2% WITH epi Laceration details:    Location:  Face   Face location:  R eyebrow   Length (cm):  3   Depth (mm):  5 Repair type:    Repair type:  Complex Pre-procedure details:    Preparation:  Patient was prepped and draped in usual sterile fashion  and imaging obtained to evaluate for foreign bodies Exploration:    Limited defect created (wound extended): no     Hemostasis achieved with:  Direct pressure   Wound exploration: wound explored through full range of motion     Wound extent: areolar tissue violated     Wound extent: no foreign bodies/material noted     Contaminated: no   Treatment:    Area cleansed with:  Betadine and saline   Amount of cleaning:  Extensive   Irrigation solution:  Sterile saline   Irrigation volume:  30   Irrigation method:  Syringe   Visualized foreign bodies/material removed: no     Debridement:  Minimal   Undermining:  None   Scar revision: no   Skin repair:    Repair method:  Sutures   Suture size:  3-0   Suture material:  Prolene   Suture technique:  Simple interrupted   Number of sutures:  4 Approximation:    Approximation:  Close Post-procedure details:    Dressing:  Adhesive bandage   Patient tolerance of procedure:  Tolerated with difficulty Comments:     Due to dementia, significant assistance required.    Discussed with wife medical decision maker and she would like patient to have antibiotics IV, fluids and treatment for infection.  COVID test positive.  Laceration repaired with multiple people helping. Sepsis - Repeat Assessment  Performed at:    1000  Vitals     Blood pressure 137/83, pulse 95, temperature (!) 95.3 F (35.2 C), temperature source Rectal, resp. rate  20, weight 51.3 kg, SpO2 93 %.  Heart:     Regular rate and rhythm  Lungs:   Rales  Capillary Refill:   <2 sec  Peripheral Pulse:   Radial pulse palpable  Skin:     Normal Color  Labs Reviewed  SARS CORONAVIRUS 2 (HOSPITAL ORDER, [facility]) - Abnormal; Notable for the following components:      Result Value   SARS Coronavirus 2 POSITIVE (*)    All other components within normal limits  CBC WITH DIFFERENTIAL/PLATELET - Abnormal; Notable for the following components:   RDW  17.2 (*)    All other components within normal limits  COMPREHENSIVE METABOLIC PANEL - Abnormal; Notable for the following components:   Glucose, Bld 106 (*)    Albumin 3.3 (*)    All other components within normal limits  LACTIC ACID, PLASMA - Abnormal; Notable for the following components:   Lactic Acid, Venous 5.1 (*)    All other components within normal limits  URINALYSIS, ROUTINE W REFLEX MICROSCOPIC - Abnormal; Notable for the following components:   Hgb urine dipstick SMALL (*)    All other components within normal limits  URINE CULTURE  CULTURE, BLOOD (ROUTINE X 2)  CULTURE, BLOOD (ROUTINE X 2)  MRSA PCR SCREENING  PROTIME-INR  LACTIC ACID, PLASMA  APTT      Early Chars Resler was evaluated in Emergency Department on 05/04/2019 for the symptoms described in the history of present illness. He was evaluated in the context of the global COVID-19 pandemic, which necessitated consideration that the patient might be at risk for infection with the SARS-CoV-2 virus that causes COVID-19. Institutional protocols and algorithms that pertain to the evaluation of patients at risk for COVID-19 are in a state of rapid change based on information released by regulatory bodies including the CDC and federal and state organizations. These policies and algorithms were followed during the patient's care in the ED.  Patient with concern for sepsis/ covid/ head injury. Lactic acid did improve with fluids, COVID positive.   Initially we recommended inpatient and IV abx/ fluids.  Resident doctor and myself had long conversations with health care decision maker regarding goals of care was patient has significant dementia and primarily comfort care with no aggressive treatments including: procedures, cpr, or intubation, nor IV medications recommended.  Family goal to keep patient out of the hospital thus admission cancelled and comfort care only. Code sepsis cancelled.  I assisted with laceration repair right  eyebrow to control bleeding.      Elnora Morrison, MD 05/01/19 1603    Elnora Morrison, MD 05/04/19 [phone]

## 2019-05-02 LAB — URINE CULTURE: Culture: NO GROWTH

## 2019-05-04 NOTE — ED Provider Notes (Signed)
Interim Progress Note  S: Patient's medical power of attorney, Stormy Card (wife), was contacted regarding next steps for patient care for this individual with end-stage Alzheimer's Dementia who is comfort care. It was explained to patient's wife that typically, when a patient falls and hits their head further imaging is utilized to rule out fractures or bleeds in the brain, face and neck. This patient, who happens to be COVID+, would be unable to have his scanning done until the end of the day and due to agitation would also need to be somewhat sedated with Ativan. The patient's wife stated the imaging was unnecessary to do, that she did not want him to have to be sedated for the imaging, that she would prefer he be returned to his nursing home sooner than later, and that even if there was a bleed she would not request surgery to address it since he is comfort care. It was explained to her that while the patient is not showing any physical exam findings suggestive of a brain bleed (as he is neurovascularly intact) that a bleed or more significant damage cannot be ruled out and could result in a life-threatening injury. The patient voiced understanding of the risk but declined imaging and intervention due to the patient's comfort care status.  O: BP (!) 122/97   Pulse 69   Temp (!) 95.3 F (35.2 C) (Rectal)   Resp 19   Wt 51.3 kg   SpO2 (!) 85%   BMI 17.70 kg/m     A/P: 78 year old male patient with Alzheimer's Dementia on comfort care presented with right eyebrow laceration s/p fall. Laceration has been sutured, bleeding has stopped, patient is neurovascularly stable. Vitals have improved and patient is no longer meeting sepsis criteria. Patient's medical decision maker has opted for patient to be returned to nursing home instead of admitted for fluids, IV antibiotics, or further monitoring.  Plan to contact [facility] for transport back to [facility] and rehab facility.  Daisy Floro,  DO 05/01/2019, 15:47 PGY-2, [place], [place], DO 05/04/19 [id number]    Elnora Morrison, MD 05/04/19 1027

## 2019-05-06 LAB — CULTURE, BLOOD (ROUTINE X 2)
Culture: NO GROWTH
Culture: NO GROWTH
Special Requests: ADEQUATE

## 2019-05-11 DIAGNOSIS — Z23 Encounter for immunization: Secondary | ICD-10-CM | POA: Diagnosis not present

## 2019-05-12 ENCOUNTER — Other Ambulatory Visit: Payer: Self-pay

## 2019-05-12 ENCOUNTER — Emergency Department (HOSPITAL_COMMUNITY)
Admission: EM | Admit: 2019-05-12 | Discharge: 2019-05-12 | Disposition: A | Discharge status: Home / Self Care | Payer: PPO | Attending: Emergency Medicine | Admitting: Emergency Medicine

## 2019-05-12 ENCOUNTER — Encounter (HOSPITAL_COMMUNITY): Payer: Self-pay

## 2019-05-12 DIAGNOSIS — I10 Essential (primary) hypertension: Secondary | ICD-10-CM | POA: Insufficient documentation

## 2019-05-12 DIAGNOSIS — S01111D Laceration without foreign body of right eyelid and periocular area, subsequent encounter: Secondary | ICD-10-CM

## 2019-05-12 DIAGNOSIS — F0281 Dementia in other diseases classified elsewhere with behavioral disturbance: Secondary | ICD-10-CM

## 2019-05-12 DIAGNOSIS — R2681 Unsteadiness on feet: Secondary | ICD-10-CM | POA: Diagnosis not present

## 2019-05-12 DIAGNOSIS — R456 Violent behavior: Secondary | ICD-10-CM | POA: Diagnosis not present

## 2019-05-12 DIAGNOSIS — J449 Chronic obstructive pulmonary disease, unspecified: Secondary | ICD-10-CM | POA: Diagnosis not present

## 2019-05-12 DIAGNOSIS — Z87891 Personal history of nicotine dependence: Secondary | ICD-10-CM | POA: Insufficient documentation

## 2019-05-12 DIAGNOSIS — W19XXXD Unspecified fall, subsequent encounter: Secondary | ICD-10-CM | POA: Diagnosis not present

## 2019-05-12 DIAGNOSIS — W19XXXA Unspecified fall, initial encounter: Secondary | ICD-10-CM | POA: Diagnosis not present

## 2019-05-12 DIAGNOSIS — S0993XD Unspecified injury of face, subsequent encounter: Secondary | ICD-10-CM | POA: Diagnosis present

## 2019-05-12 DIAGNOSIS — I480 Paroxysmal atrial fibrillation: Secondary | ICD-10-CM | POA: Diagnosis not present

## 2019-05-12 DIAGNOSIS — S01111A Laceration without foreign body of right eyelid and periocular area, initial encounter: Secondary | ICD-10-CM | POA: Diagnosis not present

## 2019-05-12 DIAGNOSIS — T148XXA Other injury of unspecified body region, initial encounter: Secondary | ICD-10-CM | POA: Diagnosis not present

## 2019-05-12 DIAGNOSIS — Z743 Need for continuous supervision: Secondary | ICD-10-CM | POA: Diagnosis not present

## 2019-05-12 DIAGNOSIS — R5381 Other malaise: Secondary | ICD-10-CM | POA: Diagnosis not present

## 2019-05-12 DIAGNOSIS — G309 Alzheimer's disease, unspecified: Secondary | ICD-10-CM | POA: Diagnosis not present

## 2019-05-12 HISTORY — DX: Unspecified atrial fibrillation: I48.91

## 2019-05-12 HISTORY — DX: Unspecified psychosis not due to a substance or known physiological condition: F29

## 2019-05-12 MED ORDER — STERILE WATER FOR INJECTION IJ SOLN
INTRAMUSCULAR | Status: AC
  Filled 2019-05-12: qty 10

## 2019-05-12 MED ORDER — ZIPRASIDONE MESYLATE 20 MG IM SOLR
10.0000 mg | Freq: Once | INTRAMUSCULAR | Status: AC
  Administered 2019-05-12: 20:00:00 10 mg via INTRAMUSCULAR
  Filled 2019-05-12: qty 20

## 2019-05-12 MED ORDER — LIDOCAINE-EPINEPHRINE (PF) 2 %-1:200000 IJ SOLN
10.0000 mL | Freq: Once | INTRAMUSCULAR | Status: AC
  Administered 2019-05-12: 10 mL
  Filled 2019-05-12: qty 10

## 2019-05-12 NOTE — ED Provider Notes (Signed)
[facility] EMERGENCY DEPARTMENT Provider Note   CSN: [id number] Arrival date & time: 05/12/19  1722     History   Chief Complaint Chief Complaint  Patient presents with  . Laceration    HPI [name] [name] is a 78 y.o. male.     [name] [name] is a 78 y.o. male with a history of Alzheimer's, COPD, hypertension, GERD, hyperlipidemia, A. fib, who presents to the emergency department from [facility] skilled nursing facility for evaluation of bleeding wound to the right eyebrow.  Patient was seen in the ED on 9/25 after a fall and sustained a laceration just beneath the right eyebrow this was closed with sutures, sutures were removed at nursing facility was picking out and messing with a scab and laceration reopened with bleeding.  Facility able to control bleeding with dressing but sent into the ED for further wound repair.  Patient is demented and unable to contribute at all to history.  Facility denies any new falls or head injury causing laceration to reopen.  Patient had previous COVID-19 infection but due to worsening dementia family chose to pursue comfort measures.  Level 5 caveat: Dementia     Past Medical History:  Diagnosis Date  . Anxiety   . Atrial fibrillation ([place])   . Carotid stenosis   . COPD (chronic obstructive pulmonary disease) ([place])   . Coronary atherosclerosis of native coronary artery    DES to LAD and circumflex 2006  . Dementia ([place])    Dr Bjorn Loser - Neurology  . Depression   . Essential hypertension, benign   . GERD (gastroesophageal reflux disease)   . Glucose intolerance (impaired glucose tolerance)   . Hemorrhoids   . History of colon polyps   . Hyperlipidemia   . Insomnia   . Long-term memory loss    Aricept and Seroquel  . Myocardial infarction ([place])   . Nosebleed    09/15/12 - Dr Adriana Reams, ENT   . Osteoarthritis   . 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([place])   . Skin cancer   . Sleep apnea   . SVT (supraventricular tachycardia) ([place])    Status post  RFA    Patient Active Problem List   Diagnosis Date Noted  . Sepsis ([place]) 12/03/2016  . Fever, unspecified 12/03/2016  . Tachycardia 12/03/2016  . Urinary tract infection without hematuria   . Hyperlipidemia   . Dyspnea on exertion 01/31/2016  . Pneumothorax 07/13/2014  . Pulmonary infiltrates 07/08/2014  . History of PSVT (paroxysmal supraventricular tachycardia) 03/16/2013  . S/P right TKA 10/08/2012  . Bilateral carotid artery occlusion 12/04/2010  . HYPERLIPIDEMIA 11/10/2008  . HYPERTENSION, BENIGN 11/10/2008  . CAD, NATIVE VESSEL 11/10/2008    Past Surgical History:  Procedure Laterality Date  . BACK SURGERY    . Cataract surgery     Bilateral  . CERVICAL SPINE SURGERY  1978   x 2  . CIRCUMCISION     Age 43  . COLONOSCOPY    . EYE SURGERY    . FINGER SURGERY     Pointer finger on left hand  . [place]  . KNEE ARTHROSCOPY  04/03/2012   Procedure: ARTHROSCOPY KNEE;  Surgeon: Marin Shutter, MD;  Location: [place];  Service: Orthopedics;  Laterality: Right;  Right Knee Arthroscopy with Debridement   . Polyp removed from nose    . Thumb surgery     Bilateral  . TONSILLECTOMY AND ADENOIDECTOMY    . TOTAL KNEE ARTHROPLASTY Right 10/07/2012  Procedure: TOTAL KNEE ARTHROPLASTY;  Surgeon: Mauri Pole, MD;  Location: [facility];  Service: Orthopedics;  Laterality: Right;  Marland Kitchen VIDEO BRONCHOSCOPY Bilateral 07/13/2014   Procedure: VIDEO BRONCHOSCOPY WITH FLUORO;  Surgeon: Tanda Rockers, MD;  Location: [facility];  Service: Cardiopulmonary;  Laterality: Bilateral;        Home Medications    Prior to Admission medications   Medication Sig Start Date End Date Taking? Authorizing Provider  acetaminophen (TYLENOL 8 HOUR) 650 MG CR tablet Take 650 mg by mouth every 8 (eight) hours as needed for pain.    [provider]  cholecalciferol (VITAMIN D) 1000 units tablet Take 1,000 Units by mouth daily.    [provider]  escitalopram (LEXAPRO) 20 MG  tablet Take 20 mg by mouth every morning.     [provider]  LORazepam (ATIVAN) 1 MG tablet Take 1 mg by mouth 2 (two) times daily. Take 1 tablet in the morning and 2 tablets at bedtime.    [provider]  Melatonin 5 MG CAPS Take 5 mg by mouth at bedtime.     [provider]  QUEtiapine (SEROQUEL) 200 MG tablet Take 200-400 mg by mouth See admin instructions. Take 1 tablet (200mg  total) in the morning and 2 tablets (400mg  total) in the evening.    [provider]  Tamsulosin HCl (FLOMAX) 0.4 MG CAPS Take 0.4 mg by mouth daily.     [provider]  donepezil (ARICEPT) 23 MG TABS tablet Take 1 tablet by mouth at bedtime.  08/05/15 05/01/19  [provider]  montelukast (SINGULAIR) 10 MG tablet Take 10 mg by mouth every morning.  05/01/19  [provider]    Family History Family History  Problem Relation Age of Onset  . Other Mother 42       Died old age  . Parkinsonism Mother   . Alzheimer's disease Father 71       Died  . Cancer Sister   . Arthritis Sister   . Heart disease Brother        Bypass surgery in his 66's  . Stroke Brother   . Lung cancer Sister        smoked    Social History Social History   Tobacco Use  . Smoking status: Former Smoker    Packs/day: 1.50    Years: 35.00    Pack years: 52.50    Types: Cigarettes    Start date: 03/07/1959    Quit date: 08/07/1999    Years since quitting: 19.7  . Smokeless tobacco: Never Used  Substance Use Topics  . Alcohol use: No    Alcohol/week: 0.0 standard drinks  . Drug use: No     Allergies   Namenda [memantine hcl]   Review of Systems Review of Systems  Unable to perform ROS: Dementia     Physical Exam Updated Vital Signs BP (!) 104/92 (BP Location: Right Arm)   Pulse 86   Temp 98.5 F (36.9 C) (Oral)   Resp 14   SpO2 (!) 85%   Physical Exam Vitals signs and nursing note reviewed.  Constitutional:      General: He is not in acute  distress.    Appearance: Normal appearance. He is well-developed and normal weight. He is not diaphoretic.  HENT:     Head: Normocephalic.     Comments: Laceration just beneath the right eyebrow reopened with small amount of bleeding, dried blood around the laceration.  Some bruising  around the right eye which appears to be chronic and improving. Eyes:     General:        Right eye: No discharge.        Left eye: No discharge.  Cardiovascular:     Rate and Rhythm: Normal rate and regular rhythm.  Pulmonary:     Effort: Pulmonary effort is normal. No respiratory distress.     Breath sounds: Normal breath sounds.     Comments: Respirations equal and unlabored, patient able to speak in full sentences, lungs clear to auscultation bilaterally Skin:    General: Skin is warm and dry.  Neurological:     Mental Status: He is alert.     Coordination: Coordination normal.     Comments: Alert, follows commands. Moving all extremities with normal coordination.  Ambulatory without assistance.  Psychiatric:        Mood and Affect: Mood normal.        Behavior: Behavior normal.      ED Treatments / Results  Labs (all labs ordered are listed, but only abnormal results are displayed) Labs Reviewed - No data to display  EKG None  Radiology No results found.  Procedures .Marland KitchenLaceration Repair  Date/Time: 05/12/2019 8:03 PM Performed by: Jacqlyn Larsen, PA-C Authorized by: Jacqlyn Larsen, PA-C   Consent:    Consent obtained:  Verbal   Consent given by:  Patient   Risks discussed:  Pain, infection, poor cosmetic result, poor wound healing and need for additional repair   Alternatives discussed:  No treatment Anesthesia (see MAR for exact dosages):    Anesthesia method:  Local infiltration   Local anesthetic:  Lidocaine 2% WITH epi Laceration details:    Location:  Face   Face location:  R eyebrow   Length (cm):  4   Depth (mm):  5 Repair type:    Repair type:  Simple Pre-procedure  details:    Preparation:  Patient was prepped and draped in usual sterile fashion Exploration:    Hemostasis achieved with:  Direct pressure and epinephrine   Wound exploration: entire depth of wound probed and visualized     Wound extent: areolar tissue violated   Treatment:    Area cleansed with:  Saline   Amount of cleaning:  Standard   Irrigation solution:  Sterile saline Skin repair:    Repair method:  Sutures   Suture size:  3-0   Wound skin closure material used: Vircyl.   Suture technique:  Simple interrupted   Number of sutures:  5 Approximation:    Approximation:  Close Post-procedure details:    Dressing:  Bulky dressing (Turban dressing applied)   Patient tolerance of procedure:  Tolerated well, no immediate complications Comments:     Patient seen on 9/25 for laceration repair after fall, stitches were taken out at skilled nursing facility but patient was picking at scabs and reopened wound, bleeding controlled on arrival.  Due to patient's dementia he is continually touching wound, therefore it was resutured with absorbable suture and bulky dressing put in place.   (including critical care time)  Medications Ordered in ED Medications  ziprasidone (GEODON) injection 10 mg (has no administration in time range)  sterile water (preservative free) injection (has no administration in time range)  lidocaine-EPINEPHrine (XYLOCAINE W/EPI) 2 %-1:200000 (PF) injection 10 mL (10 mLs Infiltration Given by Other 05/12/19 1843)     Initial Impression / Assessment and Plan / ED Course  I have reviewed the triage vital  signs and the nursing notes.  Pertinent labs & imaging results that were available during my care of the patient were reviewed by me and considered in my medical decision making (see chart for details).  Patient recently seen on 9/25 after he sustained a laceration beneath the right eyebrow from a fall, this was sutured closed, facility remove sutures as wound was  healing but then patient began to pick at wound and scab and today wound reopened with bleeding, bleeding controlled with bandage prior to arrival.  No new head injury.  Typically would avoid reclosing a wound this late after initial injury but due to patient's dementia feel that without sutures in place he will repeatedly reopen wound, he is already taken dressing off here in the department multiple times.  Wound closed with absorbable Vicryl sutures so that these will not need to be removed and should stay intact until the wound is well-healed.  With the assistance of multiple nursing staff wound anesthetized, cleaned and closed with 5 simple interrupted sutures.  Turban dressing placed.  Patient stable for discharge back to facility.  Wound care instructions provided.  While patient waiting for transport back to nursing facility notified by nursing staff that patient becoming increasingly agitated, likely sundowning from dementia, patient given one-time dose of Geodon with improvement in agitation, transported back to nursing facility without difficulty.  Final Clinical Impressions(s) / ED Diagnoses   Final diagnoses:  Laceration of right eyebrow, subsequent encounter  Alzheimer's dementia with behavioral disturbance, unspecified timing of dementia onset [facility])    ED Discharge Orders    None       Janet Berlin 05/13/19 0123    Noemi Chapel, MD 05/15/19 [phone]

## 2019-05-12 NOTE — Discharge Instructions (Signed)
The laceration was re-sutured.  Absorbable sutures were placed and these will dissolve on their own over time.  Please keep laceration covered to try and keep patient from touching it, I recommend a turban dressing.    Tylenol as needed for pain.  Monitor for any signs of infection such as redness, warmth, swelling or drainage, if these occur patient should return for reevaluation.

## 2019-05-12 NOTE — ED Triage Notes (Signed)
Received report from nurse at [facility].  Pt fell 05/01/2019 and received sutures to right brow.  Sutures were removed and pt has been picking and scratching at incision and area has re-opened.  Nurse placed dressing and bleeding is controlled.

## 2019-05-13 DIAGNOSIS — F0151 Vascular dementia with behavioral disturbance: Secondary | ICD-10-CM | POA: Diagnosis not present

## 2019-05-13 DIAGNOSIS — F062 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with delusions due to known physiological condition: Secondary | ICD-10-CM | POA: Diagnosis not present

## 2019-05-17 DIAGNOSIS — M19072 Primary osteoarthritis, left ankle and foot: Secondary | ICD-10-CM | POA: Diagnosis not present

## 2019-05-17 DIAGNOSIS — M7732 Calcaneal spur, left foot: Secondary | ICD-10-CM | POA: Diagnosis not present

## 2019-05-17 DIAGNOSIS — M79672 Pain in left foot: Secondary | ICD-10-CM | POA: Diagnosis not present

## 2019-05-20 DIAGNOSIS — R2242 Localized swelling, mass and lump, left lower limb: Secondary | ICD-10-CM | POA: Diagnosis not present

## 2019-05-21 DIAGNOSIS — G301 Alzheimer's disease with late onset: Secondary | ICD-10-CM | POA: Diagnosis not present

## 2019-05-21 DIAGNOSIS — D649 Anemia, unspecified: Secondary | ICD-10-CM | POA: Diagnosis not present

## 2019-05-21 DIAGNOSIS — I70212 Atherosclerosis of native arteries of extremities with intermittent claudication, left leg: Secondary | ICD-10-CM | POA: Diagnosis not present

## 2019-05-21 DIAGNOSIS — Z79899 Other long term (current) drug therapy: Secondary | ICD-10-CM | POA: Diagnosis not present

## 2019-05-25 DIAGNOSIS — G301 Alzheimer's disease with late onset: Secondary | ICD-10-CM | POA: Diagnosis not present

## 2019-05-27 DIAGNOSIS — F0151 Vascular dementia with behavioral disturbance: Secondary | ICD-10-CM | POA: Diagnosis not present

## 2019-05-27 DIAGNOSIS — F062 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with delusions due to known physiological condition: Secondary | ICD-10-CM | POA: Diagnosis not present

## 2019-06-01 DIAGNOSIS — R0989 Other specified symptoms and signs involving the circulatory and respiratory systems: Secondary | ICD-10-CM | POA: Diagnosis not present

## 2019-06-01 DIAGNOSIS — R05 Cough: Secondary | ICD-10-CM | POA: Diagnosis not present

## 2019-06-08 DIAGNOSIS — G301 Alzheimer's disease with late onset: Secondary | ICD-10-CM | POA: Diagnosis not present

## 2019-06-17 DIAGNOSIS — F0151 Vascular dementia with behavioral disturbance: Secondary | ICD-10-CM | POA: Diagnosis not present

## 2019-06-17 DIAGNOSIS — F062 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with delusions due to known physiological condition: Secondary | ICD-10-CM | POA: Diagnosis not present

## 2019-06-19 ENCOUNTER — Encounter (HOSPITAL_COMMUNITY): Payer: Self-pay | Admitting: Emergency Medicine

## 2019-06-19 ENCOUNTER — Other Ambulatory Visit: Payer: Self-pay

## 2019-06-19 ENCOUNTER — Emergency Department (HOSPITAL_COMMUNITY)
Admission: EM | Admit: 2019-06-19 | Discharge: 2019-06-20 | Disposition: A | Discharge status: Skilled Nursing Facility | Payer: PPO | Attending: Emergency Medicine | Admitting: Emergency Medicine

## 2019-06-19 DIAGNOSIS — I2510 Atherosclerotic heart disease of native coronary artery without angina pectoris: Secondary | ICD-10-CM | POA: Diagnosis not present

## 2019-06-19 DIAGNOSIS — S01112A Laceration without foreign body of left eyelid and periocular area, initial encounter: Secondary | ICD-10-CM | POA: Insufficient documentation

## 2019-06-19 DIAGNOSIS — W19XXXA Unspecified fall, initial encounter: Secondary | ICD-10-CM | POA: Diagnosis not present

## 2019-06-19 DIAGNOSIS — F0390 Unspecified dementia without behavioral disturbance: Secondary | ICD-10-CM | POA: Insufficient documentation

## 2019-06-19 DIAGNOSIS — S0181XA Laceration without foreign body of other part of head, initial encounter: Secondary | ICD-10-CM | POA: Diagnosis not present

## 2019-06-19 DIAGNOSIS — Y929 Unspecified place or not applicable: Secondary | ICD-10-CM | POA: Insufficient documentation

## 2019-06-19 DIAGNOSIS — R58 Hemorrhage, not elsewhere classified: Secondary | ICD-10-CM | POA: Diagnosis not present

## 2019-06-19 DIAGNOSIS — Y939 Activity, unspecified: Secondary | ICD-10-CM | POA: Diagnosis not present

## 2019-06-19 DIAGNOSIS — Y999 Unspecified external cause status: Secondary | ICD-10-CM | POA: Insufficient documentation

## 2019-06-19 DIAGNOSIS — Z85828 Personal history of other malignant neoplasm of skin: Secondary | ICD-10-CM | POA: Diagnosis not present

## 2019-06-19 DIAGNOSIS — S51011A Laceration without foreign body of right elbow, initial encounter: Secondary | ICD-10-CM | POA: Diagnosis not present

## 2019-06-19 DIAGNOSIS — R456 Violent behavior: Secondary | ICD-10-CM | POA: Diagnosis not present

## 2019-06-19 DIAGNOSIS — D649 Anemia, unspecified: Secondary | ICD-10-CM | POA: Diagnosis not present

## 2019-06-19 DIAGNOSIS — J449 Chronic obstructive pulmonary disease, unspecified: Secondary | ICD-10-CM | POA: Insufficient documentation

## 2019-06-19 DIAGNOSIS — Z87891 Personal history of nicotine dependence: Secondary | ICD-10-CM | POA: Diagnosis not present

## 2019-06-19 NOTE — ED Triage Notes (Signed)
[facility] - pt from [facility]. Pt had an unwitnessed fall, pt has laceration above left eyebrow. Pt has hx of dementia.

## 2019-06-20 ENCOUNTER — Emergency Department (HOSPITAL_COMMUNITY): Payer: PPO

## 2019-06-20 DIAGNOSIS — S01112A Laceration without foreign body of left eyelid and periocular area, initial encounter: Secondary | ICD-10-CM | POA: Diagnosis not present

## 2019-06-20 DIAGNOSIS — S0181XA Laceration without foreign body of other part of head, initial encounter: Secondary | ICD-10-CM | POA: Diagnosis not present

## 2019-06-20 DIAGNOSIS — F0390 Unspecified dementia without behavioral disturbance: Secondary | ICD-10-CM | POA: Diagnosis not present

## 2019-06-20 LAB — CBC WITH DIFFERENTIAL/PLATELET
Abs Immature Granulocytes: 0.03 10*3/uL (ref 0.00–0.07)
Basophils Absolute: 0 10*3/uL (ref 0.0–0.1)
Basophils Relative: 1 %
Eosinophils Absolute: 0 10*3/uL (ref 0.0–0.5)
Eosinophils Relative: 0 %
HCT: 30.1 % — ABNORMAL LOW (ref 39.0–52.0)
Hemoglobin: 9.2 g/dL — ABNORMAL LOW (ref 13.0–17.0)
Immature Granulocytes: 0 %
Lymphocytes Relative: 21 %
Lymphs Abs: 1.5 10*3/uL (ref 0.7–4.0)
MCH: 30.8 pg (ref 26.0–34.0)
MCHC: 30.6 g/dL (ref 30.0–36.0)
MCV: 100.7 fL — ABNORMAL HIGH (ref 80.0–100.0)
Monocytes Absolute: 0.8 10*3/uL (ref 0.1–1.0)
Monocytes Relative: 11 %
Neutro Abs: 4.7 10*3/uL (ref 1.7–7.7)
Neutrophils Relative %: 67 %
Platelets: 186 10*3/uL (ref 150–400)
RBC: 2.99 MIL/uL — ABNORMAL LOW (ref 4.22–5.81)
RDW: 15.8 % — ABNORMAL HIGH (ref 11.5–15.5)
WBC: 7.1 10*3/uL (ref 4.0–10.5)
nRBC: 0 % (ref 0.0–0.2)

## 2019-06-20 LAB — COMPREHENSIVE METABOLIC PANEL
ALT: 18 U/L (ref 0–44)
AST: 17 U/L (ref 15–41)
Albumin: 3 g/dL — ABNORMAL LOW (ref 3.5–5.0)
Alkaline Phosphatase: 59 U/L (ref 38–126)
Anion gap: 2 — ABNORMAL LOW (ref 5–15)
BUN: 27 mg/dL — ABNORMAL HIGH (ref 8–23)
CO2: 27 mmol/L (ref 22–32)
Calcium: 8.3 mg/dL — ABNORMAL LOW (ref 8.9–10.3)
Chloride: 108 mmol/L (ref 98–111)
Creatinine, Ser: 0.68 mg/dL (ref 0.61–1.24)
GFR calc Af Amer: >60 mL/min (ref >60)
GFR calc non Af Amer: >60 mL/min (ref >60)
Glucose, Bld: 100 mg/dL — ABNORMAL HIGH (ref 70–99)
Potassium: 4.2 mmol/L (ref 3.5–5.1)
Sodium: 137 mmol/L (ref 135–145)
Total Bilirubin: 0.3 mg/dL (ref 0.3–1.2)
Total Protein: 6.1 g/dL — ABNORMAL LOW (ref 6.5–8.1)

## 2019-06-20 NOTE — ED Notes (Signed)
Checked on pt. Pt sleeping at this time

## 2019-06-20 NOTE — ED Provider Notes (Signed)
Emergency Department Provider Note   I have reviewed the triage vital signs and the nursing notes.   HISTORY  Chief Complaint Laceration and Fall   HPI [name] [name] is a 78 y.o. male who has significant dementia presents to the emergency department today with unwitnessed fall.  Has a laceration above his left eyebrow that is very been repaired with Steri-Strips at the facility and is hemostatic.  Also has a skin tear to his right forearm that is hemostatic with a bandage in place.   Level V Caveat secondary to Dementia   Past Medical History:  Diagnosis Date   Anxiety    Atrial fibrillation (HCC)    Carotid stenosis    COPD (chronic obstructive pulmonary disease) (HCC)    Coronary atherosclerosis of native coronary artery    DES to LAD and circumflex 2006   Dementia (HCC)    Dr Bjorn Loser - Neurology   Depression    Essential hypertension, benign    GERD (gastroesophageal reflux disease)    Glucose intolerance (impaired glucose tolerance)    Hemorrhoids    History of colon polyps    Hyperlipidemia    Insomnia    Long-term memory loss    Aricept and Seroquel   Myocardial infarction ([place])    Nosebleed    09/15/12 - Dr Adriana Reams, ENT    Osteoarthritis    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[facility])    Skin cancer    Sleep apnea    SVT (supraventricular tachycardia) ([place])    Status post RFA    Patient Active Problem List   Diagnosis Date Noted   Sepsis ([place]) 12/03/2016   Fever, unspecified 12/03/2016   Tachycardia 12/03/2016   Urinary tract infection without hematuria    Hyperlipidemia    Dyspnea on exertion 01/31/2016   Pneumothorax 07/13/2014   Pulmonary infiltrates 07/08/2014   History of PSVT (paroxysmal supraventricular tachycardia) 03/16/2013   S/P right TKA 10/08/2012   Bilateral carotid artery occlusion 12/04/2010   HYPERLIPIDEMIA 11/10/2008   HYPERTENSION, BENIGN 11/10/2008   CAD, NATIVE VESSEL 11/10/2008    Past Surgical  History:  Procedure Laterality Date   BACK SURGERY     Cataract surgery     Bilateral   CERVICAL SPINE SURGERY  1978   x 2   CIRCUMCISION     Age 71   COLONOSCOPY     EYE SURGERY     FINGER SURGERY     Pointer finger on left hand   [place]   KNEE ARTHROSCOPY  04/03/2012   Procedure: ARTHROSCOPY KNEE;  Surgeon: Marin Shutter, MD;  Location: [facility];  Service: Orthopedics;  Laterality: Right;  Right Knee Arthroscopy with Debridement    Polyp removed from nose     Thumb surgery     Bilateral   TONSILLECTOMY AND ADENOIDECTOMY     TOTAL KNEE ARTHROPLASTY Right 10/07/2012   Procedure: TOTAL KNEE ARTHROPLASTY;  Surgeon: Mauri Pole, MD;  Location: [facility];  Service: Orthopedics;  Laterality: Right;   VIDEO BRONCHOSCOPY Bilateral 07/13/2014   Procedure: VIDEO BRONCHOSCOPY WITH FLUORO;  Surgeon: Tanda Rockers, MD;  Location[facility];  Service: Cardiopulmonary;  Laterality: Bilateral;    Current Outpatient Rx   Order #: [id number] Class: Historical Med   Order #: [id number] Class: Historical Med   Order #: [id number] Class: Historical Med   Order #: [id number] Class: Historical Med   Order #: [id number] Class: Historical Med   Order #: [id number] Class: Historical Med   Order #: [id number] Class: Historical  Med    Allergies Namenda [memantine hcl]  Family History  Problem Relation Age of Onset   Other Mother 39       Died old age   Parkinsonism Mother    Alzheimer's disease Father 62       Died   Cancer Sister    Arthritis Sister    Heart disease Brother        Bypass surgery in his 66's   Stroke Brother    Lung cancer Sister        smoked    Social History Social History   Tobacco Use   Smoking status: Former Smoker    Packs/day: 1.50    Years: 35.00    Pack years: 52.50    Types: Cigarettes    Start date: 03/07/1959    Quit date: 08/07/1999    Years since quitting: 19.8   Smokeless tobacco: Never Used  Substance Use Topics    Alcohol use: No    Alcohol/week: 0.0 standard drinks   Drug use: No    Review of Systems  Level V Caveat secondary to Dementia ____________________________________________   PHYSICAL EXAM:  VITAL SIGNS: ED Triage Vitals  Enc Vitals Group     BP 06/20/19 0144 (!) 101/53     Pulse Rate 06/20/19 0144 (!) 56     Resp 06/20/19 0144 17     Temp --      Temp src --      SpO2 06/20/19 0144 98 %     Weight 06/19/19 2356 113 lb 1.5 oz (51.3 kg)    Constitutional: Alert but disoriented. Seems baseline. Well appearing and in no acute distress. Eyes: Conjunctivae are normal. PERRL. EOMI. Head: Atraumatic. Nose: No congestion/rhinnorhea. Mouth/Throat: Mucous membranes are moist.  Oropharynx non-erythematous. Neck: No stridor.  No meningeal signs.   Cardiovascular: Normal rate, regular rhythm. Good peripheral circulation. Grossly normal heart sounds.   Respiratory: Normal respiratory effort.  No retractions. Lungs CTAB. Gastrointestinal: Soft and nontender. No distention.  Musculoskeletal: No cervical spine tenderness, thoracic spine tenderness or Lumbar spine tenderness.  No tenderness or pain with palpation and full ROM of all joints in upper and lower extremities.  No ecchymosis or other signs of trauma on back or extremities.  No Pain with AP or lateral compression of ribs.  No Paracervical ttp, paraspinal ttp Neurologic:  Normal speech and language. No gross focal neurologic deficits are appreciated.  Skin:  Skin is warm, dry and intact. No rash noted. Has a 1.5 cm laceration above his left eyebrow that is very been repaired with Steri-Strips at the facility and is hemostatic.  Also has a skin tear to his right forearm that is hemostatic with a bandage in place.    ____________________________________________   LABS (all labs ordered are listed, but only abnormal results are displayed)  Labs Reviewed  CBC WITH DIFFERENTIAL/PLATELET - Abnormal; Notable for the following  components:      Result Value   RBC 2.99 (*)    Hemoglobin 9.2 (*)    HCT 30.1 (*)    MCV 100.7 (*)    RDW 15.8 (*)    All other components within normal limits  COMPREHENSIVE METABOLIC PANEL - Abnormal; Notable for the following components:   Glucose, Bld 100 (*)    BUN 27 (*)    Calcium 8.3 (*)    Total Protein 6.1 (*)    Albumin 3.0 (*)    Anion gap 2 (*)    All other  components within normal limits   ____________________________________________   RADIOLOGY  Ct Head Wo Contrast  Result Date: 06/20/2019 CLINICAL DATA:  Dementia, laceration to forehead EXAM: CT HEAD WITHOUT CONTRAST CT CERVICAL SPINE WITHOUT CONTRAST TECHNIQUE: Multidetector CT imaging of the head and cervical spine was performed following the standard protocol without intravenous contrast. Multiplanar CT image reconstructions of the cervical spine were also generated. COMPARISON:  04/25/2018 FINDINGS: CT HEAD FINDINGS Brain: There is atrophy and chronic small vessel disease changes. No acute intracranial abnormality. Specifically, no hemorrhage, hydrocephalus, mass lesion, acute infarction, or significant intracranial injury. Vascular: No hyperdense vessel or unexpected calcification. Skull: No acute calvarial abnormality. Sinuses/Orbits: Visualized paranasal sinuses and mastoids clear. Orbital soft tissues unremarkable. Other: Soft tissue swelling in left forehead. CT CERVICAL SPINE FINDINGS Alignment: No subluxation Skull base and vertebrae: Motion decreases image quality. No fracture or focal bone lesion visualized. Soft tissues and spinal canal: No prevertebral fluid or swelling. No visible canal hematoma. Disc levels: Fusion from C4-C6. Moderate to advanced degenerative disc and facet disease diffusely. Upper chest: Emphysema.  No acute findings. Other: None IMPRESSION: Atrophy, chronic microvascular disease. No acute intracranial abnormality. Cervical spondylosis. No acute bony abnormality in the cervical spine.  Electronically Signed   By: Rolm Baptise M.D.   On: 06/20/2019 02:26   Ct Cervical Spine Wo Contrast  Result Date: 06/20/2019 CLINICAL DATA:  Dementia, laceration to forehead EXAM: CT HEAD WITHOUT CONTRAST CT CERVICAL SPINE WITHOUT CONTRAST TECHNIQUE: Multidetector CT imaging of the head and cervical spine was performed following the standard protocol without intravenous contrast. Multiplanar CT image reconstructions of the cervical spine were also generated. COMPARISON:  04/25/2018 FINDINGS: CT HEAD FINDINGS Brain: There is atrophy and chronic small vessel disease changes. No acute intracranial abnormality. Specifically, no hemorrhage, hydrocephalus, mass lesion, acute infarction, or significant intracranial injury. Vascular: No hyperdense vessel or unexpected calcification. Skull: No acute calvarial abnormality. Sinuses/Orbits: Visualized paranasal sinuses and mastoids clear. Orbital soft tissues unremarkable. Other: Soft tissue swelling in left forehead. CT CERVICAL SPINE FINDINGS Alignment: No subluxation Skull base and vertebrae: Motion decreases image quality. No fracture or focal bone lesion visualized. Soft tissues and spinal canal: No prevertebral fluid or swelling. No visible canal hematoma. Disc levels: Fusion from C4-C6. Moderate to advanced degenerative disc and facet disease diffusely. Upper chest: Emphysema.  No acute findings. Other: None IMPRESSION: Atrophy, chronic microvascular disease. No acute intracranial abnormality. Cervical spondylosis. No acute bony abnormality in the cervical spine. Electronically Signed   By: Rolm Baptise M.D.   On: 06/20/2019 02:26    ____________________________________________   PROCEDURES  Procedure(s) performed:   Procedures   ____________________________________________   INITIAL IMPRESSION / ASSESSMENT AND PLAN / ED COURSE  Fall with injuries to skin but no obvious calvarial or MSK injuries. Plan for dc.   Anemia is new. Will need pcp  follow up to further delineate. Not unstable and no indication for emergent workup of same.   Pertinent labs & imaging results that were available during my care of the patient were reviewed by me and considered in my medical decision making (see chart for details).   A medical screening exam was performed and I feel the patient has had an appropriate workup for their chief complaint at this time and likelihood of emergent condition existing is low. They have been counseled on decision, discharge, follow up and which symptoms necessitate immediate return to the emergency department. They or their family verbally stated understanding and agreement with plan and discharged in stable  condition.   ____________________________________________  FINAL CLINICAL IMPRESSION(S) / ED DIAGNOSES  Final diagnoses:  Fall, initial encounter  Facial laceration, initial encounter  Skin tear of right elbow without complication, initial encounter  Anemia, unspecified type     MEDICATIONS GIVEN DURING THIS VISIT:  Medications - No data to display   NEW OUTPATIENT MEDICATIONS STARTED DURING THIS VISIT:  Discharge Medication List as of 06/20/2019  2:57 AM      Note:  This note was prepared with assistance of Dragon voice recognition software. Occasional wrong-word or sound-a-like substitutions may have occurred due to the inherent limitations of voice recognition software.   Merrily Pew, MD 06/20/19 [phone]

## 2019-06-20 NOTE — ED Notes (Signed)
Upon initial assessment, Pt presented with severe dementia, does not recall how he got to [facility] or that he was even in a hospital. Pt presents with a laceration to anterior head. Bleeding controlled and bandage in place at this time. Pt was attempting to walk out of facility when he was redirected to the bed and asked to lay down.

## 2019-06-22 DIAGNOSIS — D649 Anemia, unspecified: Secondary | ICD-10-CM | POA: Diagnosis not present

## 2019-06-22 DIAGNOSIS — N189 Chronic kidney disease, unspecified: Secondary | ICD-10-CM | POA: Diagnosis not present

## 2019-07-01 DIAGNOSIS — F0151 Vascular dementia with behavioral disturbance: Secondary | ICD-10-CM | POA: Diagnosis not present

## 2019-07-01 DIAGNOSIS — F062 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with delusions due to known physiological condition: Secondary | ICD-10-CM | POA: Diagnosis not present

## 2019-07-07 DIAGNOSIS — Z03818 Encounter for observation for suspected exposure to other biological agents ruled out: Secondary | ICD-10-CM | POA: Diagnosis not present

## 2019-07-07 DIAGNOSIS — Z20828 Contact with and (suspected) exposure to other viral communicable diseases: Secondary | ICD-10-CM | POA: Diagnosis not present

## 2019-07-08 DIAGNOSIS — J439 Emphysema, unspecified: Secondary | ICD-10-CM | POA: Diagnosis not present

## 2019-07-08 DIAGNOSIS — F329 Major depressive disorder, single episode, unspecified: Secondary | ICD-10-CM | POA: Diagnosis not present

## 2019-07-08 DIAGNOSIS — U071 COVID-19: Secondary | ICD-10-CM | POA: Diagnosis not present

## 2019-07-08 DIAGNOSIS — F0391 Unspecified dementia with behavioral disturbance: Secondary | ICD-10-CM | POA: Diagnosis not present

## 2019-07-13 DIAGNOSIS — Z03818 Encounter for observation for suspected exposure to other biological agents ruled out: Secondary | ICD-10-CM | POA: Diagnosis not present

## 2019-07-13 DIAGNOSIS — Z20828 Contact with and (suspected) exposure to other viral communicable diseases: Secondary | ICD-10-CM | POA: Diagnosis not present

## 2019-07-15 DIAGNOSIS — F062 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with delusions due to known physiological condition: Secondary | ICD-10-CM | POA: Diagnosis not present

## 2019-07-15 DIAGNOSIS — F0151 Vascular dementia with behavioral disturbance: Secondary | ICD-10-CM | POA: Diagnosis not present

## 2019-07-21 ENCOUNTER — Encounter (HOSPITAL_COMMUNITY): Payer: Self-pay | Admitting: Emergency Medicine

## 2019-07-21 ENCOUNTER — Emergency Department (HOSPITAL_COMMUNITY)
Admission: EM | Admit: 2019-07-21 | Discharge: 2019-07-21 | Disposition: A | Discharge status: Skilled Nursing Facility | Payer: PPO | Attending: Emergency Medicine | Admitting: Emergency Medicine

## 2019-07-21 DIAGNOSIS — R456 Violent behavior: Secondary | ICD-10-CM | POA: Diagnosis not present

## 2019-07-21 DIAGNOSIS — I10 Essential (primary) hypertension: Secondary | ICD-10-CM | POA: Insufficient documentation

## 2019-07-21 DIAGNOSIS — W010XXA Fall on same level from slipping, tripping and stumbling without subsequent striking against object, initial encounter: Secondary | ICD-10-CM

## 2019-07-21 DIAGNOSIS — Y929 Unspecified place or not applicable: Secondary | ICD-10-CM | POA: Insufficient documentation

## 2019-07-21 DIAGNOSIS — Z79899 Other long term (current) drug therapy: Secondary | ICD-10-CM | POA: Diagnosis not present

## 2019-07-21 DIAGNOSIS — S0101XA Laceration without foreign body of scalp, initial encounter: Secondary | ICD-10-CM | POA: Insufficient documentation

## 2019-07-21 DIAGNOSIS — Y939 Activity, unspecified: Secondary | ICD-10-CM | POA: Diagnosis not present

## 2019-07-21 DIAGNOSIS — F0390 Unspecified dementia without behavioral disturbance: Secondary | ICD-10-CM | POA: Insufficient documentation

## 2019-07-21 DIAGNOSIS — S0990XA Unspecified injury of head, initial encounter: Secondary | ICD-10-CM | POA: Diagnosis present

## 2019-07-21 DIAGNOSIS — Z7401 Bed confinement status: Secondary | ICD-10-CM | POA: Diagnosis not present

## 2019-07-21 DIAGNOSIS — Z23 Encounter for immunization: Secondary | ICD-10-CM | POA: Diagnosis not present

## 2019-07-21 DIAGNOSIS — Z7722 Contact with and (suspected) exposure to environmental tobacco smoke (acute) (chronic): Secondary | ICD-10-CM | POA: Insufficient documentation

## 2019-07-21 DIAGNOSIS — W19XXXA Unspecified fall, initial encounter: Secondary | ICD-10-CM | POA: Diagnosis not present

## 2019-07-21 DIAGNOSIS — Z85820 Personal history of malignant melanoma of skin: Secondary | ICD-10-CM | POA: Diagnosis not present

## 2019-07-21 DIAGNOSIS — J449 Chronic obstructive pulmonary disease, unspecified: Secondary | ICD-10-CM | POA: Insufficient documentation

## 2019-07-21 DIAGNOSIS — R410 Disorientation, unspecified: Secondary | ICD-10-CM | POA: Diagnosis not present

## 2019-07-21 DIAGNOSIS — Y999 Unspecified external cause status: Secondary | ICD-10-CM | POA: Diagnosis not present

## 2019-07-21 DIAGNOSIS — R58 Hemorrhage, not elsewhere classified: Secondary | ICD-10-CM | POA: Diagnosis not present

## 2019-07-21 DIAGNOSIS — R5381 Other malaise: Secondary | ICD-10-CM | POA: Diagnosis not present

## 2019-07-21 DIAGNOSIS — R279 Unspecified lack of coordination: Secondary | ICD-10-CM | POA: Diagnosis not present

## 2019-07-21 DIAGNOSIS — R404 Transient alteration of awareness: Secondary | ICD-10-CM | POA: Diagnosis not present

## 2019-07-21 MED ORDER — LIDOCAINE-EPINEPHRINE (PF) 2 %-1:200000 IJ SOLN
INTRAMUSCULAR | Status: AC
  Filled 2019-07-21: qty 20

## 2019-07-21 MED ORDER — TETANUS-DIPHTH-ACELL PERTUSSIS 5-2.5-18.5 LF-MCG/0.5 IM SUSP
0.5000 mL | Freq: Once | INTRAMUSCULAR | Status: AC
  Administered 2019-07-21: 0.5 mL via INTRAMUSCULAR
  Filled 2019-07-21: qty 0.5

## 2019-07-21 NOTE — ED Provider Notes (Signed)
[facility] Provider Note   CSN: [id number] Arrival date & time: 07/21/19  0134     History Chief Complaint  Patient presents with  . Fall    [name] [name] is a 78 y.o. male.  [facility] s[facility]p fall at [facility]. Hx dementia - pt very limited historian - level 5 caveat. No loc noted. Patients mental status post fall, and prior to EMS transport reported at baseline. No anticoag use. Patient with laceration to scalp. No other apparent pain or injury reported. Tetanus unknown.   The history is provided by the patient and the EMS personnel. The history is limited by the condition of the patient.  Fall       Past Medical History:  Diagnosis Date  . Anxiety   . Atrial fibrillation ([place])   . Carotid stenosis   . COPD (chronic obstructive pulmonary disease) ([place])   . Coronary atherosclerosis of native coronary artery    DES to LAD and circumflex 2006  . Dementia ([place])    Dr Bjorn Loser - Neurology  . Depression   . Essential hypertension, benign   . GERD (gastroesophageal reflux disease)   . Glucose intolerance (impaired glucose tolerance)   . Hemorrhoids   . History of colon polyps   . Hyperlipidemia   . Insomnia   . Long-term memory loss    Aricept and Seroquel  . Myocardial infarction ([place])   . Nosebleed    09/15/12 - Dr Adriana Reams, ENT   . Osteoarthritis   . 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([place])   . Skin cancer   . Sleep apnea   . SVT (supraventricular tachycardia) ([place])    Status post RFA    Patient Active Problem List   Diagnosis Date Noted  . Sepsis ([place]) 12/03/2016  . Fever, unspecified 12/03/2016  . Tachycardia 12/03/2016  . Urinary tract infection without hematuria   . Hyperlipidemia   . Dyspnea on exertion 01/31/2016  . Pneumothorax 07/13/2014  . Pulmonary infiltrates 07/08/2014  . History of PSVT (paroxysmal supraventricular tachycardia) 03/16/2013  . S/P right TKA 10/08/2012  . Bilateral carotid artery occlusion 12/04/2010  . HYPERLIPIDEMIA  11/10/2008  . HYPERTENSION, BENIGN 11/10/2008  . CAD, NATIVE VESSEL 11/10/2008    Past Surgical History:  Procedure Laterality Date  . BACK SURGERY    . Cataract surgery     Bilateral  . CERVICAL SPINE SURGERY  1978   x 2  . CIRCUMCISION     Age 72  . COLONOSCOPY    . EYE SURGERY    . FINGER SURGERY     Pointer finger on left hand  . [place]  . KNEE ARTHROSCOPY  04/03/2012   Procedure: ARTHROSCOPY KNEE;  Surgeon: Marin Shutter, MD;  Location: [facility];  Service: Orthopedics;  Laterality: Right;  Right Knee Arthroscopy with Debridement   . Polyp removed from nose    . Thumb surgery     Bilateral  . TONSILLECTOMY AND ADENOIDECTOMY    . TOTAL KNEE ARTHROPLASTY Right 10/07/2012   Procedure: TOTAL KNEE ARTHROPLASTY;  Surgeon: Mauri Pole, MD;  Location: [facility];  Service: Orthopedics;  Laterality: Right;  Marland Kitchen VIDEO BRONCHOSCOPY Bilateral 07/13/2014   Procedure: VIDEO BRONCHOSCOPY WITH FLUORO;  Surgeon: Tanda Rockers, MD;  [facility];  Service: Cardiopulmonary;  Laterality: Bilateral;       Family History  Problem Relation Age of Onset  . Other Mother 22       Died old age  . Parkinsonism  Mother   . Alzheimer's disease Father 86       Died  . Cancer Sister   . Arthritis Sister   . Heart disease Brother        Bypass surgery in his 58's  . Stroke Brother   . Lung cancer Sister        smoked    Social History   Tobacco Use  . Smoking status: Former Smoker    Packs/day: 1.50    Years: 35.00    Pack years: 52.50    Types: Cigarettes    Start date: 03/07/1959    Quit date: 08/07/1999    Years since quitting: 19.9  . Smokeless tobacco: Never Used  Substance Use Topics  . Alcohol use: No    Alcohol/week: 0.0 standard drinks  . Drug use: No    Home Medications Prior to Admission medications   Medication Sig Start Date End Date Taking? Authorizing Provider  acetaminophen (TYLENOL 8 HOUR) 650 MG CR tablet Take 650 mg by mouth every 8 (eight)  hours as needed for pain.    [provider]  cholecalciferol (VITAMIN D) 1000 units tablet Take 1,000 Units by mouth daily.    [provider]  escitalopram (LEXAPRO) 20 MG tablet Take 20 mg by mouth every morning.     [provider]  LORazepam (ATIVAN) 1 MG tablet Take 1 mg by mouth 2 (two) times daily. Take 1 tablet in the morning and 2 tablets at bedtime.    [provider]  Melatonin 5 MG CAPS Take 5 mg by mouth at bedtime.     [provider]  QUEtiapine (SEROQUEL) 200 MG tablet Take 200-400 mg by mouth See admin instructions. Take 1 tablet (200mg  total) in the morning and 2 tablets (400mg  total) in the evening.    [provider]  Tamsulosin HCl (FLOMAX) 0.4 MG CAPS Take 0.4 mg by mouth daily.     [provider]  donepezil (ARICEPT) 23 MG TABS tablet Take 1 tablet by mouth at bedtime.  08/05/15 05/01/19  [provider]  montelukast (SINGULAIR) 10 MG tablet Take 10 mg by mouth every morning.  05/01/19  [provider]    Allergies    Namenda [memantine hcl]  Review of Systems   Review of Systems  Unable to perform ROS: Dementia  level 5 caveat - advanced dementia    Physical Exam Updated Vital Signs BP 139/86 (BP Location: Left Arm)   Pulse 67   Temp 98.1 F (36.7 C) (Oral)   Resp 16   SpO2 100%   Physical Exam Vitals and nursing note reviewed.  Constitutional:      Appearance: Normal appearance. He is well-developed.  HENT:     Head:     Comments: 5 cm laceration to scalp. No active bleeding.  Facial bones, orbits grossly intact.     Nose: Nose normal.     Comments: No nasal septal hematoma, no nosebleeding.     Mouth/Throat:     Mouth: Mucous membranes are moist.     Pharynx: Oropharynx is clear.  Eyes:     General: No scleral icterus.    Extraocular Movements: Extraocular movements intact.     Conjunctiva/sclera: Conjunctivae normal.     Pupils: Pupils are equal, round, and  reactive to light.  Neck:     Trachea: No tracheal deviation.  Cardiovascular:     Rate and Rhythm: Normal rate and regular rhythm.     Pulses: Normal  pulses.     Heart sounds: Normal heart sounds. No murmur. No friction rub. No gallop.   Pulmonary:     Effort: Pulmonary effort is normal. No accessory muscle usage or respiratory distress.     Breath sounds: Normal breath sounds.  Chest:     Chest wall: No tenderness.  Abdominal:     General: Bowel sounds are normal. There is no distension.     Palpations: Abdomen is soft.     Tenderness: There is no abdominal tenderness. There is no guarding.     Comments: No abd bruising or contusion.   Genitourinary:    Comments: No cva tenderness. Musculoskeletal:        General: No swelling.     Cervical back: Normal range of motion and neck supple. No rigidity.     Comments: CTLS spine, non tender, aligned, no step off. Good rom bil extremities without pain or focal bony tenderness. Distal pulses palp bil.   Skin:    General: Skin is warm and dry.     Findings: No rash.  Neurological:     Mental Status: He is alert.     Comments: Awake and alert. Mental status/confusion reported at baseline for patient. Moves bil extremities purposefully with good strength.   Psychiatric:        Mood and Affect: Mood normal.     ED Results / Procedures / Treatments   Labs (all labs ordered are listed, but only abnormal results are displayed) Labs Reviewed - No data to display  EKG None  Radiology No results found.  Procedures .Marland KitchenLaceration Repair  Date/Time: 07/21/2019 2:18 AM Performed by: Lajean Saver, MD Authorized by: Lajean Saver, MD   Anesthesia (see MAR for exact dosages):    Anesthesia method:  Local infiltration   Local anesthetic:  Lidocaine 2% WITH epi Laceration details:    Location:  Scalp   Scalp location:  Mid-scalp   Length (cm):  5 Repair type:    Repair type:  Simple Pre-procedure details:    Preparation:  Patient  was prepped and draped in usual sterile fashion Exploration:    Wound exploration: entire depth of wound probed and visualized     Contaminated: no   Treatment:    Area cleansed with:  Saline   Amount of cleaning:  Standard   Irrigation solution:  Sterile saline   Irrigation method:  Syringe   Visualized foreign bodies/material removed: no   Skin repair:    Repair method:  Staples   Number of staples:  10 Post-procedure details:    Dressing:  Antibiotic ointment   Patient tolerance of procedure:  Tolerated well, no immediate complications   (including critical care time)  Medications Ordered in ED Medications  lidocaine-EPINEPHrine (XYLOCAINE W/EPI) 2 %-1:200000 (PF) injection (has no administration in time range)  Tdap (BOOSTRIX) injection 0.5 mL (has no administration in time range)    ED Course  I have reviewed the triage vital signs and the nursing notes.  Pertinent labs & imaging results that were available during my care of the patient were reviewed by me and considered in my medical decision making (see chart for details).    MDM Rules/Calculators/A&P                      Wound cleaned. Staple wound closure - see note.   Tetanus im.  Reviewed nursing notes and prior charts for additional history.   Patients mental status remains c/w baseline, fully awake  and alert, purposefully moving about on stretcher. No apparent pain or discomfort.   Patient moving extremities and neck freely without any apparent pain or discomfort. Remains alert, content. No vomiting.   CTLS spine, non tender, aligned, no step off.  Patient currently appears stable for d/c.   Fall precautions. Return precautions provided.      Final Clinical Impression(s) / ED Diagnoses Final diagnoses:  None    Rx / DC Orders ED Discharge Orders    None       Lajean Saver, MD 07/21/19 [phone]

## 2019-07-21 NOTE — Discharge Instructions (Addendum)
It was our pleasure to provide your ER care today - we hope that you feel better.  Fall precautions - use assistance when getting up or ambulating.   Keep wound very clean.   Have staples removed in 7-10 days.   Return to ER if worse, new symptoms, fevers, new or severe pain, persistent vomiting, change in mental status, or other concern.

## 2019-07-21 NOTE — ED Notes (Signed)
[facility] called for transport at this time.

## 2019-07-21 NOTE — ED Triage Notes (Signed)
Pt found on floor at [facility] by staff. Fall not witness by staff. Pt at baseline for LOC per staff. Pt can become combative, and is disoriented x's 4. Pt has laceration on head, specific location unknown due to large about of blood and clots covering head. Pt only takes baby ASP. Not on any other blood thinners.

## 2019-07-27 DIAGNOSIS — Z20828 Contact with and (suspected) exposure to other viral communicable diseases: Secondary | ICD-10-CM | POA: Diagnosis not present

## 2019-07-27 DIAGNOSIS — Z03818 Encounter for observation for suspected exposure to other biological agents ruled out: Secondary | ICD-10-CM | POA: Diagnosis not present

## 2019-07-28 ENCOUNTER — Encounter: Payer: Self-pay | Admitting: Student

## 2019-07-28 ENCOUNTER — Telehealth (INDEPENDENT_AMBULATORY_CARE_PROVIDER_SITE_OTHER): Payer: PPO | Admitting: Student

## 2019-07-28 VITALS — BP 130/94 | HR 92 | Temp 97.4°F | Resp 20 | Ht 67.0 in | Wt 130.0 lb

## 2019-07-28 DIAGNOSIS — I10 Essential (primary) hypertension: Secondary | ICD-10-CM | POA: Diagnosis not present

## 2019-07-28 DIAGNOSIS — F0390 Unspecified dementia without behavioral disturbance: Secondary | ICD-10-CM

## 2019-07-28 DIAGNOSIS — Z8679 Personal history of other diseases of the circulatory system: Secondary | ICD-10-CM

## 2019-07-28 DIAGNOSIS — M6281 Muscle weakness (generalized): Secondary | ICD-10-CM | POA: Diagnosis not present

## 2019-07-28 DIAGNOSIS — Z7982 Long term (current) use of aspirin: Secondary | ICD-10-CM

## 2019-07-28 DIAGNOSIS — G301 Alzheimer's disease with late onset: Secondary | ICD-10-CM | POA: Diagnosis not present

## 2019-07-28 DIAGNOSIS — I2510 Atherosclerotic heart disease of native coronary artery without angina pectoris: Secondary | ICD-10-CM | POA: Diagnosis not present

## 2019-07-28 DIAGNOSIS — Z8249 Family history of ischemic heart disease and other diseases of the circulatory system: Secondary | ICD-10-CM

## 2019-07-28 DIAGNOSIS — I471 Supraventricular tachycardia: Secondary | ICD-10-CM

## 2019-07-28 NOTE — Progress Notes (Signed)
Virtual Visit via Telephone Note   This visit type was conducted due to national recommendations for restrictions regarding the COVID-19 Pandemic (e.g. social distancing) in an effort to limit this patient's exposure and mitigate transmission in our community.  Due to his co-morbid illnesses, this patient is at least at moderate risk for complications without adequate follow up.  This format is felt to be most appropriate for this patient at this time.  The patient did not have access to video technology/had technical difficulties with video requiring transitioning to audio format only (telephone).  All issues noted in this document were discussed and addressed.  No physical exam could be performed with this format.  Please refer to the patient's chart for his  consent to telehealth for [facility].   Date:  07/28/2019   ID:  [name] [name], DOB [birth year], MRN [id number]  Patient Location: [place] Provider Location: Office  PCP:  Scotty Court, DO  Cardiologist:  Kate Sable, MD  Electrophysiologist:  None   Evaluation Performed:  Follow-Up Visit  Chief Complaint: Overdue Visit  History of Present Illness:    [name] [name] is a 78 y.o. male with past medical history of CAD (s/p DES to LAD and LCx in 2006, low-risk NST in 09/2014), SVT (s/p prior ablation), HTN, HLD, COPD and dementia who presents for an overdue follow-up telehealth visit.   He was last examined by Dr. Bronson Ing in 08/2017 and denied any recent chest pain or dyspnea on exertion. He was suffering from advanced Alzheimer's dementia at that time and was being cared for by his wife at home. He was continued on ASA and statin therapy had previously been discontinued by his PCP and they did not wish to restart at that time.   He did present to [facility] ED in 04/2019 for evaluation following a fall and laceration to his right eye. He was found to be COVID positive and was initially going to be  admitted but family wished to pursue comfort care options and for him to avoid admission.  He presented back to the ED on 07/21/2019 for a recurrent fall and had a recurrent laceration to his scalp which was repaired with staples.   History is provided by the patient's nurse as he has severe dementia and is unable to contribute. She reports that he is sometimes not even alert and oriented x1. He is in a wheelchair throughout the day and sometimes hits his head on the sink which has led to his lacerations as outlined above. He has not reported to them any recent chest pain or palpitations. He has been in his usual state of health. His wife has communicated with them that they prefer comfort measures and do not wish for him to undergo any procedures or surgeries.   Past Medical History:  Diagnosis Date  . Anxiety   . Atrial fibrillation ([place])   . Carotid stenosis   . COPD (chronic obstructive pulmonary disease) ([place])   . Coronary atherosclerosis of native coronary artery    DES to LAD and circumflex 2006  . Dementia ([place])    Dr Bjorn Loser - Neurology  . Depression   . Essential hypertension, benign   . GERD (gastroesophageal reflux disease)   . Glucose intolerance (impaired glucose tolerance)   . Hemorrhoids   . History of colon polyps   . Hyperlipidemia   . Insomnia   . Long-term memory loss    Aricept and Seroquel  . Myocardial infarction (  [place])   . Nosebleed    09/15/12 - Dr Adriana Reams, ENT   . Osteoarthritis   . 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([place])   . Skin cancer   . Sleep apnea   . SVT (supraventricular tachycardia) ([facility])    Status post RFA   Past Surgical History:  Procedure Laterality Date  . BACK SURGERY    . Cataract surgery     Bilateral  . CERVICAL SPINE SURGERY  1978   x 2  . CIRCUMCISION     Age 46  . COLONOSCOPY    . EYE SURGERY    . FINGER SURGERY     Pointer finger on left hand  . [place]  . KNEE ARTHROSCOPY  04/03/2012   Procedure: ARTHROSCOPY KNEE;   Surgeon: Marin Shutter, MD;  Location: [facility];  Service: Orthopedics;  Laterality: Right;  Right Knee Arthroscopy with Debridement   . Polyp removed from nose    . Thumb surgery     Bilateral  . TONSILLECTOMY AND ADENOIDECTOMY    . TOTAL KNEE ARTHROPLASTY Right 10/07/2012   Procedure: TOTAL KNEE ARTHROPLASTY;  Surgeon: Mauri Pole, MD;  [facility];  Service: Orthopedics;  Laterality: Right;  Marland Kitchen VIDEO BRONCHOSCOPY Bilateral 07/13/2014   Procedure: VIDEO BRONCHOSCOPY WITH FLUORO;  Surgeon: Tanda Rockers, MD;  [facility];  Service: Cardiopulmonary;  Laterality: Bilateral;     Current Meds  Medication Sig  . acetaminophen (TYLENOL 8 HOUR) 650 MG CR tablet Take 650 mg by mouth every 8 (eight) hours as needed for pain.  Marland Kitchen aspirin EC 81 MG tablet Take 81 mg by mouth daily.  . cholecalciferol (VITAMIN D) 1000 units tablet Take 1,000 Units by mouth daily.  . divalproex (DEPAKOTE SPRINKLE) 125 MG capsule   . escitalopram (LEXAPRO) 20 MG tablet Take 20 mg by mouth every morning.   Marland Kitchen LORazepam (ATIVAN) 1 MG tablet Take 1 mg by mouth 2 (two) times daily. Take 1 tablet in the morning and 2 tablets at bedtime.  . Melatonin 5 MG CAPS Take 5 mg by mouth at bedtime.   . mirtazapine (REMERON) 7.5 MG tablet Take 7.5 mg by mouth at bedtime.  Marland Kitchen omeprazole (PRILOSEC) 20 MG capsule Take 20 mg by mouth daily.  . QUEtiapine (SEROQUEL) 200 MG tablet Take 200-400 mg by mouth See admin instructions. Take 1 tablet (200mg  total) in the morning and 2 tablets (400mg  total) in the evening.  . senna (SENOKOT) 8.6 MG tablet Take 1 tablet by mouth daily.  . Tamsulosin HCl (FLOMAX) 0.4 MG CAPS Take 0.4 mg by mouth daily.      Allergies:   Namenda [memantine hcl]   Social History   Tobacco Use  . Smoking status: Former Smoker    Packs/day: 1.50    Years: 35.00    Pack years: 52.50    Types: Cigarettes    Start date: 03/07/1959    Quit date: 08/07/1999    Years since quitting: 19.9  . Smokeless  tobacco: Never Used  Substance Use Topics  . Alcohol use: No    Alcohol/week: 0.0 standard drinks  . Drug use: No     Family Hx: The patient's family history includes Alzheimer's disease (age of onset: 32) in his father; Arthritis in his sister; Cancer in his sister; Heart disease in his brother; Lung cancer in his sister; Other (age of onset: 26) in his mother; Parkinsonism in his mother; Stroke in his brother.  ROS:   Please see  the history of present illness.     All other systems reviewed and are negative.   Prior CV studies:   The following studies were reviewed today:  Echocardiogram: 09/2014 Study Conclusions   - Left ventricle: The cavity size was normal. Wall thickness was  normal. Systolic function was normal. The estimated ejection  fraction was in the range of 60% to 65%. Indeterminate diastolic  function. Wall motion was normal; there were no regional wall  motion abnormalities.  - Aortic valve: Mildly calcified annulus. Trileaflet; mildly  thickened leaflets. There was mild to moderate regurgitation.  Valve area (VTI): 3.33 cm^2. Valve area (Vmax): 3.33 cm^2. Valve  area (Vmean): 2.68 cm^2. Regurgitation pressure half-time: 449  ms.  - Mitral valve: Mildly calcified annulus. Normal thickness leaflets  .  - Technically adequate study.   NST: 09/2014 IMPRESSION: 1. No reversible ischemia or infarction.  2. Normal left ventricular wall motion.  3. Left ventricular ejection fraction 61%  4. Low-risk stress test findings*.  Labs/Other Tests and Data Reviewed:    EKG:  An ECG dated 05/01/2019 was personally reviewed today and demonstrated: NSR, HR 91 with PAC's.   Recent Labs: 06/20/2019: ALT 18; BUN 27; Creatinine, Ser 0.68; Hemoglobin 9.2; Platelets 186; Potassium 4.2; Sodium 137   Recent Lipid Panel Lab Results  Component Value Date/Time   CHOL 193 11/15/2011 08:36 AM   TRIG 72.0 11/15/2011 08:36 AM   HDL 67.30 11/15/2011 08:36  AM   CHOLHDL 3 11/15/2011 08:36 AM   LDLCALC 111 (H) 11/15/2011 08:36 AM    Wt Readings from Last 3 Encounters:  07/28/19 130 lb (59 kg)  06/19/19 113 lb 1.5 oz (51.3 kg)  05/01/19 113 lb (51.3 kg)     Objective:    Vital Signs:  BP (!) 130/94   Pulse 92   Temp (!) 97.4 F (36.3 C)   Resp 20   Ht 5\' 7"  (1.702 m)   Wt 130 lb (59 kg)   BMI 20.36 kg/m    Not performed as this was a phone visit.   ASSESSMENT & PLAN:    1. CAD - he is s/p DES to LAD and LCx in 2006 with most recent ischemic evaluation being a low-risk NST in 09/2014. He has severe dementia which hinders the ROS but there have been no reports of chest pain or significant dyspnea.   - continue ASA. Not on statin therapy given advanced dementia and comfort measures.   2. SVT - s/p prior ablation. No reports of recent palpitations. He is no longer on beta-blocker therapy.  3. HTN - BP was stable at 130/94 on most recent check. He is no longer on antihypertensive medications.  4. Dementia - most history provided by patient's nurse given his advanced dementia.    In talking with the patient's nurse, the family has pursued mostly comfort measures given his severe dementia. Will arrange for Cardiology follow-up on an as-needed basis.   COVID-19 Education: The signs and symptoms of COVID-19 were discussed with the patient and how to seek care for testing (follow up with PCP or arrange E-visit). The importance of social distancing was discussed today.  Time:   Today, I have spent 11 minutes with the patient with telehealth technology discussing the above problems.     Medication Adjustments/Labs and Tests Ordered: Current medicines are reviewed at length with the patient today.  Concerns regarding medicines are outlined above.   Tests Ordered: No orders of the defined types were placed in  this encounter.   Medication Changes: No orders of the defined types were placed in this encounter.   Follow Up:  Follow-up with Cardiology as needed.   Signed, Erma Heritage, PA-C  07/28/2019 5:29 PM    [facility]

## 2019-07-28 NOTE — Patient Instructions (Signed)
Medication Instructions:  Your physician recommends that you continue on your current medications as directed. Please refer to the Current Medication list given to you today.  *If you need a refill on your cardiac medications before your next appointment, please call your pharmacy*  Lab Work: NONE   If you have labs (blood work) drawn today and your tests are completely normal, you will receive your results only by: Marland Kitchen MyChart Message (if you have MyChart) OR . A paper copy in the mail If you have any lab test that is abnormal or we need to change your treatment, we will call you to review the results.  Testing/Procedures: NONE   Follow-Up: At [facility], you and your health needs are our priority.  As part of our continuing mission to provide you with exceptional heart care, we have created designated Provider Care Teams.  These Care Teams include your primary Cardiologist (physician) and Advanced Practice Providers (APPs -  Physician Assistants and Nurse Practitioners) who all work together to provide you with the care you need, when you need it.  Your next appointment:    As Needed   The format for your next appointment:   Either In Person or Virtual  Provider:   You may see Kate Sable, MD or one of the following Advanced Practice Providers on your designated Care Team:    Bernerd Pho, PA-C   Ermalinda Barrios, PA-C    Other Instructions Thank you for choosing [facility]!

## 2019-08-05 DIAGNOSIS — F062 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with delusions due to known physiological condition: Secondary | ICD-10-CM | POA: Diagnosis not present

## 2019-08-05 DIAGNOSIS — F0151 Vascular dementia with behavioral disturbance: Secondary | ICD-10-CM | POA: Diagnosis not present

## 2019-08-10 DIAGNOSIS — Z20828 Contact with and (suspected) exposure to other viral communicable diseases: Secondary | ICD-10-CM | POA: Diagnosis not present

## 2019-08-10 DIAGNOSIS — Z03818 Encounter for observation for suspected exposure to other biological agents ruled out: Secondary | ICD-10-CM | POA: Diagnosis not present

## 2019-08-10 DIAGNOSIS — G301 Alzheimer's disease with late onset: Secondary | ICD-10-CM | POA: Diagnosis not present

## 2019-08-10 DIAGNOSIS — M6281 Muscle weakness (generalized): Secondary | ICD-10-CM | POA: Diagnosis not present

## 2019-08-12 DIAGNOSIS — U071 COVID-19: Secondary | ICD-10-CM | POA: Diagnosis not present

## 2019-08-12 DIAGNOSIS — I480 Paroxysmal atrial fibrillation: Secondary | ICD-10-CM | POA: Diagnosis not present

## 2019-08-12 DIAGNOSIS — N400 Enlarged prostate without lower urinary tract symptoms: Secondary | ICD-10-CM | POA: Diagnosis not present

## 2019-08-12 DIAGNOSIS — F0391 Unspecified dementia with behavioral disturbance: Secondary | ICD-10-CM | POA: Diagnosis not present

## 2019-08-19 DIAGNOSIS — F062 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with delusions due to known physiological condition: Secondary | ICD-10-CM | POA: Diagnosis not present

## 2019-08-19 DIAGNOSIS — F0151 Vascular dementia with behavioral disturbance: Secondary | ICD-10-CM | POA: Diagnosis not present

## 2019-08-24 DIAGNOSIS — Z1152 Encounter for screening for COVID-19: Secondary | ICD-10-CM | POA: Diagnosis not present

## 2019-08-24 DIAGNOSIS — Z20822 Contact with and (suspected) exposure to covid-19: Secondary | ICD-10-CM | POA: Diagnosis not present

## 2019-08-25 ENCOUNTER — Encounter (HOSPITAL_COMMUNITY): Payer: Self-pay | Admitting: *Deleted

## 2019-08-25 ENCOUNTER — Other Ambulatory Visit: Payer: Self-pay

## 2019-08-25 ENCOUNTER — Emergency Department (HOSPITAL_COMMUNITY)
Admission: EM | Admit: 2019-08-25 | Discharge: 2019-08-25 | Disposition: A | Discharge status: Home / Self Care | Payer: PPO | Attending: Emergency Medicine | Admitting: Emergency Medicine

## 2019-08-25 DIAGNOSIS — Z7982 Long term (current) use of aspirin: Secondary | ICD-10-CM | POA: Diagnosis not present

## 2019-08-25 DIAGNOSIS — R404 Transient alteration of awareness: Secondary | ICD-10-CM | POA: Diagnosis not present

## 2019-08-25 DIAGNOSIS — J449 Chronic obstructive pulmonary disease, unspecified: Secondary | ICD-10-CM | POA: Diagnosis not present

## 2019-08-25 DIAGNOSIS — R456 Violent behavior: Secondary | ICD-10-CM | POA: Diagnosis not present

## 2019-08-25 DIAGNOSIS — E119 Type 2 diabetes mellitus without complications: Secondary | ICD-10-CM | POA: Insufficient documentation

## 2019-08-25 DIAGNOSIS — R000 Tachycardia, unspecified: Secondary | ICD-10-CM | POA: Diagnosis not present

## 2019-08-25 DIAGNOSIS — Z79899 Other long term (current) drug therapy: Secondary | ICD-10-CM | POA: Diagnosis not present

## 2019-08-25 DIAGNOSIS — R69 Illness, unspecified: Secondary | ICD-10-CM | POA: Diagnosis not present

## 2019-08-25 DIAGNOSIS — Z87891 Personal history of nicotine dependence: Secondary | ICD-10-CM | POA: Diagnosis not present

## 2019-08-25 DIAGNOSIS — R1110 Vomiting, unspecified: Secondary | ICD-10-CM

## 2019-08-25 DIAGNOSIS — R112 Nausea with vomiting, unspecified: Secondary | ICD-10-CM | POA: Diagnosis not present

## 2019-08-25 DIAGNOSIS — R410 Disorientation, unspecified: Secondary | ICD-10-CM | POA: Diagnosis not present

## 2019-08-25 DIAGNOSIS — I2510 Atherosclerotic heart disease of native coronary artery without angina pectoris: Secondary | ICD-10-CM | POA: Insufficient documentation

## 2019-08-25 DIAGNOSIS — R58 Hemorrhage, not elsewhere classified: Secondary | ICD-10-CM | POA: Diagnosis not present

## 2019-08-25 DIAGNOSIS — Z96651 Presence of right artificial knee joint: Secondary | ICD-10-CM | POA: Diagnosis not present

## 2019-08-25 DIAGNOSIS — I10 Essential (primary) hypertension: Secondary | ICD-10-CM | POA: Diagnosis not present

## 2019-08-25 DIAGNOSIS — K920 Hematemesis: Secondary | ICD-10-CM | POA: Insufficient documentation

## 2019-08-25 DIAGNOSIS — Z7401 Bed confinement status: Secondary | ICD-10-CM | POA: Diagnosis not present

## 2019-08-25 DIAGNOSIS — R0902 Hypoxemia: Secondary | ICD-10-CM | POA: Diagnosis not present

## 2019-08-25 LAB — COMPREHENSIVE METABOLIC PANEL
ALT: 25 U/L (ref 0–44)
AST: 23 U/L (ref 15–41)
Albumin: 3.5 g/dL (ref 3.5–5.0)
Alkaline Phosphatase: 56 U/L (ref 38–126)
Anion gap: 12 (ref 5–15)
BUN: 46 mg/dL — ABNORMAL HIGH (ref 8–23)
CO2: 27 mmol/L (ref 22–32)
Calcium: 9.1 mg/dL (ref 8.9–10.3)
Chloride: 102 mmol/L (ref 98–111)
Creatinine, Ser: 1.24 mg/dL (ref 0.61–1.24)
GFR calc Af Amer: >60 mL/min (ref >60)
GFR calc non Af Amer: 56 mL/min — ABNORMAL LOW (ref >60)
Glucose, Bld: 121 mg/dL — ABNORMAL HIGH (ref 70–99)
Potassium: 3.9 mmol/L (ref 3.5–5.1)
Sodium: 141 mmol/L (ref 135–145)
Total Bilirubin: 0.2 mg/dL — ABNORMAL LOW (ref 0.3–1.2)
Total Protein: 7.3 g/dL (ref 6.5–8.1)

## 2019-08-25 LAB — CBC WITH DIFFERENTIAL/PLATELET
Abs Immature Granulocytes: 0.04 10*3/uL (ref 0.00–0.07)
Basophils Absolute: 0 10*3/uL (ref 0.0–0.1)
Basophils Relative: 0 %
Eosinophils Absolute: 0 10*3/uL (ref 0.0–0.5)
Eosinophils Relative: 0 %
HCT: 32.2 % — ABNORMAL LOW (ref 39.0–52.0)
Hemoglobin: 9.8 g/dL — ABNORMAL LOW (ref 13.0–17.0)
Immature Granulocytes: 0 %
Lymphocytes Relative: 14 %
Lymphs Abs: 1.4 10*3/uL (ref 0.7–4.0)
MCH: 28.2 pg (ref 26.0–34.0)
MCHC: 30.4 g/dL (ref 30.0–36.0)
MCV: 92.8 fL (ref 80.0–100.0)
Monocytes Absolute: 0.9 10*3/uL (ref 0.1–1.0)
Monocytes Relative: 9 %
Neutro Abs: 7.7 10*3/uL (ref 1.7–7.7)
Neutrophils Relative %: 77 %
Platelets: 232 10*3/uL (ref 150–400)
RBC: 3.47 MIL/uL — ABNORMAL LOW (ref 4.22–5.81)
RDW: 13.9 % (ref 11.5–15.5)
WBC: 10.1 10*3/uL (ref 4.0–10.5)
nRBC: 0 % (ref 0.0–0.2)

## 2019-08-25 LAB — HEMOGLOBIN AND HEMATOCRIT, BLOOD
HCT: 31.1 % — ABNORMAL LOW (ref 39.0–52.0)
Hemoglobin: 9.5 g/dL — ABNORMAL LOW (ref 13.0–17.0)

## 2019-08-25 LAB — LIPASE, BLOOD: Lipase: 18 U/L (ref 11–51)

## 2019-08-25 LAB — TYPE AND SCREEN
ABO/RH(D): O NEG
Antibody Screen: NEGATIVE

## 2019-08-25 MED ORDER — ONDANSETRON 4 MG PO TBDP: 4.0000 mg | ORAL_TABLET | Freq: Three times a day (TID) | ORAL | 0 refills | Status: AC | PRN

## 2019-08-25 MED ORDER — OMEPRAZOLE 20 MG PO CPDR: 20.0000 mg | DELAYED_RELEASE_CAPSULE | Freq: Two times a day (BID) | ORAL | 0 refills | Status: AC

## 2019-08-25 MED ORDER — ONDANSETRON HCL 4 MG/2ML IJ SOLN
4.0000 mg | Freq: Once | INTRAMUSCULAR | Status: AC
  Administered 2019-08-25: 4 mg via INTRAVENOUS
  Filled 2019-08-25: qty 2

## 2019-08-25 NOTE — ED Provider Notes (Signed)
[facility] Provider Note   CSN: [id number] Arrival date & time: 08/25/19  0407     History Chief Complaint  Patient presents with  . Hematemesis    [name] [name] is a 79 y.o. male.  HPI     This is a 79 year old male with a history of coronary artery disease, dementia, hypertension, hyperlipidemia who presents with hematemesis.  EMS was called to his living facility for episodes of dark brown emesis.  They had not noted any bloody stools or bloody emesis per report.  Patient is unable to contribute to history taking.  He is able to tell me that he does not have any pain.  Chart review reveals DNR.  No anticoagulants.  Level 5 caveat for dementia.  Past Medical History:  Diagnosis Date  . Anxiety   . Atrial fibrillation ([place])   . Carotid stenosis   . COPD (chronic obstructive pulmonary disease) ([place])   . Coronary atherosclerosis of native coronary artery    DES to LAD and circumflex 2006  . Dementia ([place])    Dr Bjorn Loser - Neurology  . Depression   . Essential hypertension, benign   . GERD (gastroesophageal reflux disease)   . Glucose intolerance (impaired glucose tolerance)   . Hemorrhoids   . History of colon polyps   . Hyperlipidemia   . Insomnia   . Long-term memory loss    Aricept and Seroquel  . Myocardial infarction ([place])   . Nosebleed    09/15/12 - Dr Adriana Reams, ENT   . Osteoarthritis   . 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([place])   . Skin cancer   . Sleep apnea   . SVT (supraventricular tachycardia) ([place])    Status post RFA    Patient Active Problem List   Diagnosis Date Noted  . Sepsis ([place]) 12/03/2016  . Fever, unspecified 12/03/2016  . Tachycardia 12/03/2016  . Urinary tract infection without hematuria   . Hyperlipidemia   . Dyspnea on exertion 01/31/2016  . Pneumothorax 07/13/2014  . Pulmonary infiltrates 07/08/2014  . History of PSVT (paroxysmal supraventricular tachycardia) 03/16/2013  . S/P right TKA 10/08/2012  . Bilateral  carotid artery occlusion 12/04/2010  . HYPERLIPIDEMIA 11/10/2008  . HYPERTENSION, BENIGN 11/10/2008  . CAD, NATIVE VESSEL 11/10/2008    Past Surgical History:  Procedure Laterality Date  . BACK SURGERY    . Cataract surgery     Bilateral  . CERVICAL SPINE SURGERY  1978   x 2  . CIRCUMCISION     Age 72  . COLONOSCOPY    . EYE SURGERY    . FINGER SURGERY     Pointer finger on left hand  . [place]  . KNEE ARTHROSCOPY  04/03/2012   Procedure: ARTHROSCOPY KNEE;  Surgeon: Marin Shutter, MD;  Location: [facility];  Service: Orthopedics;  Laterality: Right;  Right Knee Arthroscopy with Debridement   . Polyp removed from nose    . Thumb surgery     Bilateral  . TONSILLECTOMY AND ADENOIDECTOMY    . TOTAL KNEE ARTHROPLASTY Right 10/07/2012   Procedure: TOTAL KNEE ARTHROPLASTY;  Surgeon: Mauri Pole, MD;  [facility];  Service: Orthopedics;  Laterality: Right;  Marland Kitchen VIDEO BRONCHOSCOPY Bilateral 07/13/2014   Procedure: VIDEO BRONCHOSCOPY WITH FLUORO;  Surgeon: Tanda Rockers, MD;  [facility];  Service: Cardiopulmonary;  Laterality: Bilateral;       Family History  Problem Relation Age of Onset  . Other Mother 91  Died old age  . Parkinsonism Mother   . Alzheimer's disease Father 22       Died  . Cancer Sister   . Arthritis Sister   . Heart disease Brother        Bypass surgery in his 1's  . Stroke Brother   . Lung cancer Sister        smoked    Social History   Tobacco Use  . Smoking status: Former Smoker    Packs/day: 1.50    Years: 35.00    Pack years: 52.50    Types: Cigarettes    Start date: 03/07/1959    Quit date: 08/07/1999    Years since quitting: 20.0  . Smokeless tobacco: Never Used  Substance Use Topics  . Alcohol use: No    Alcohol/week: 0.0 standard drinks  . Drug use: No    Home Medications Prior to Admission medications   Medication Sig Start Date End Date Taking? Authorizing Provider  acetaminophen (TYLENOL 8 HOUR)  650 MG CR tablet Take 650 mg by mouth every 8 (eight) hours as needed for pain.    [provider]  aspirin EC 81 MG tablet Take 81 mg by mouth daily.    [provider]  cholecalciferol (VITAMIN D) 1000 units tablet Take 1,000 Units by mouth daily.    [provider]  divalproex (DEPAKOTE SPRINKLE) 125 MG capsule  07/08/19   [provider]  escitalopram (LEXAPRO) 20 MG tablet Take 20 mg by mouth every morning.     [provider]  LORazepam (ATIVAN) 1 MG tablet Take 1 mg by mouth 2 (two) times daily. Take 1 tablet in the morning and 2 tablets at bedtime.    [provider]  Melatonin 5 MG CAPS Take 5 mg by mouth at bedtime.     [provider]  mirtazapine (REMERON) 7.5 MG tablet Take 7.5 mg by mouth at bedtime. 07/07/19   [provider]  omeprazole (PRILOSEC) 20 MG capsule Take 20 mg by mouth daily. 06/22/19   [provider]  QUEtiapine (SEROQUEL) 200 MG tablet Take 200-400 mg by mouth See admin instructions. Take 1 tablet (200mg  total) in the morning and 2 tablets (400mg  total) in the evening.    [provider]  senna (SENOKOT) 8.6 MG tablet Take 1 tablet by mouth daily.    [provider]  Tamsulosin HCl (FLOMAX) 0.4 MG CAPS Take 0.4 mg by mouth daily.     [provider]  donepezil (ARICEPT) 23 MG TABS tablet Take 1 tablet by mouth at bedtime.  08/05/15 05/01/19  [provider]  montelukast (SINGULAIR) 10 MG tablet Take 10 mg by mouth every morning.  05/01/19  [provider]    Allergies    Namenda [memantine hcl]  Review of Systems   Review of Systems  Unable to perform ROS: Dementia  Gastrointestinal: Positive for nausea and vomiting. Negative for abdominal pain.  Psychiatric/Behavioral: Positive for confusion.    Physical Exam Updated Vital Signs BP 126/68   Pulse 96   Temp 98.1 F (36.7 C) (Tympanic)   Resp (!) 21   Ht 1.753 m (5\' 9" )   Wt 62.1 kg    SpO2 98%   BMI 20.23 kg/m   Physical Exam Vitals and nursing note reviewed.  Constitutional:      Appearance: He is well-developed.     Comments: Elderly, chronically ill-appearing Frequently spitting, no blood noted  HENT:     Head:  Normocephalic and atraumatic.     Mouth/Throat:     Mouth: Mucous membranes are dry.  Eyes:     Pupils: Pupils are equal, round, and reactive to light.  Cardiovascular:     Rate and Rhythm: Normal rate and regular rhythm.     Heart sounds: Normal heart sounds. No murmur.  Pulmonary:     Effort: Pulmonary effort is normal. No respiratory distress.     Breath sounds: Normal breath sounds. No wheezing.  Abdominal:     General: Bowel sounds are normal.     Palpations: Abdomen is soft.     Tenderness: There is no abdominal tenderness. There is no rebound.  Musculoskeletal:     Cervical back: Neck supple.     Right lower leg: No edema.     Left lower leg: No edema.  Lymphadenopathy:     Cervical: No cervical adenopathy.  Skin:    General: Skin is warm and dry.  Neurological:     Mental Status: He is alert.     Comments: Oriented only to self  Psychiatric:     Comments: Confusion with intermittent agitation     ED Results / Procedures / Treatments   Labs (all labs ordered are listed, but only abnormal results are displayed) Labs Reviewed  CBC WITH DIFFERENTIAL/PLATELET - Abnormal; Notable for the following components:      Result Value   RBC 3.47 (*)    Hemoglobin 9.8 (*)    HCT 32.2 (*)    All other components within normal limits  COMPREHENSIVE METABOLIC PANEL - Abnormal; Notable for the following components:   Glucose, Bld 121 (*)    BUN 46 (*)    Total Bilirubin 0.2 (*)    GFR calc non Af Amer 56 (*)    All other components within normal limits  LIPASE, BLOOD  HEMOGLOBIN AND HEMATOCRIT, BLOOD  TYPE AND SCREEN    EKG EKG Interpretation  Date/Time:  Tuesday August 25 2019 04:29:28 EST Ventricular Rate:  105 PR Interval:     QRS Duration: 92 QT Interval:  342 QTC Calculation: 452 R Axis:   39 Text Interpretation: Sinus tachycardia Multiple ventricular premature complexes Left ventricular hypertrophy Confirmed by Thayer Jew [id number]) on 08/25/2019 4:47:26 AM   Radiology No results found.  Procedures Procedures (including critical care time)  Medications Ordered in ED Medications  ondansetron (ZOFRAN) injection 4 mg (4 mg Intravenous Given 08/25/19 0450)    ED Course  I have reviewed the triage vital signs and the nursing notes.  Pertinent labs & imaging results that were available during my care of the patient were reviewed by me and considered in my medical decision making (see chart for details).  Clinical Course as of Aug 24 730  Tue Aug 25, 2019  0559 Spoke with patient's wife, who is his power of attorney.  She confirms DNR.  She also reports that she would like to avoid any invasive procedures.  Primary goal of care is comfort.  Discussed with her monitoring closely for recurrent emesis and repeat hemoglobin in 4 hours.  If stable, will plan for discharge.   [CH]    Clinical Course User Index [CH] Andie Mortimer, Barbette Hair, MD   MDM Rules/Calculators/A&P                      Patient presents with vomiting from his living facility.  He is hemodynamically stable.  He does not provide information.  Vital signs are reassuring.  He is spitting but no evidence of blood at the bedside.  I had a discussion with his wife who would like to avoid any invasive procedures.  Her main goal is for him to be comfortable.  Initial hemoglobin is 9.8 which is stable.  We will plan for repeat hemoglobin of 4 hours.  If stable and no ongoing vomiting or evidence of hematemesis, will discharge back to his living facility.  Signed out to oncoming physician.  Final Clinical Impression(s) / ED Diagnoses Final diagnoses:  None    Rx / DC Orders ED Discharge Orders    None       Keath Matera, Barbette Hair, MD 08/25/19  [phone]

## 2019-08-25 NOTE — ED Provider Notes (Signed)
Patient has remained stable in the emergency department.  Hemoglobin is 9.5 which is essentially unchanged from earlier in the morning.  No vomiting.  Discussed with his wife, she would prefer him to be discharged without any type of further work-up.  I will increase his Prilosec and also prescribe Zofran.  We discussed return precautions.  She does not want GI specific follow-up but rather follow-up with PCP as needed as her main goal is comfort.   Sherwood Gambler, MD 08/25/19 1011

## 2019-08-25 NOTE — ED Triage Notes (Signed)
Ems called out to nursing home for gi bleed, pt spitting up black emesis in room upon arrival to er, pt is confused, has hx of dementia

## 2019-08-25 NOTE — Discharge Instructions (Addendum)
If you develop recurrent vomiting, vomiting blood, dizziness, lightheadedness, chest pain, abdominal pain, or any other new/concerning symptoms then return to the ER for evaluation.  Increase your Prilosec from 20 mg a day to 20 mg twice per day for the next 2 weeks.

## 2019-08-27 DIAGNOSIS — M2041 Other hammer toe(s) (acquired), right foot: Secondary | ICD-10-CM | POA: Diagnosis not present

## 2019-08-27 DIAGNOSIS — I739 Peripheral vascular disease, unspecified: Secondary | ICD-10-CM | POA: Diagnosis not present

## 2019-08-27 DIAGNOSIS — L84 Corns and callosities: Secondary | ICD-10-CM | POA: Diagnosis not present

## 2019-08-27 DIAGNOSIS — L602 Onychogryphosis: Secondary | ICD-10-CM | POA: Diagnosis not present

## 2019-08-31 DIAGNOSIS — Z20828 Contact with and (suspected) exposure to other viral communicable diseases: Secondary | ICD-10-CM | POA: Diagnosis not present

## 2019-09-14 DIAGNOSIS — Z20822 Contact with and (suspected) exposure to covid-19: Secondary | ICD-10-CM | POA: Diagnosis not present

## 2019-09-14 DIAGNOSIS — Z1152 Encounter for screening for COVID-19: Secondary | ICD-10-CM | POA: Diagnosis not present

## 2019-09-17 DIAGNOSIS — U071 COVID-19: Secondary | ICD-10-CM | POA: Diagnosis not present

## 2019-09-17 DIAGNOSIS — F0391 Unspecified dementia with behavioral disturbance: Secondary | ICD-10-CM | POA: Diagnosis not present

## 2019-09-17 DIAGNOSIS — J439 Emphysema, unspecified: Secondary | ICD-10-CM | POA: Diagnosis not present

## 2019-09-17 DIAGNOSIS — I480 Paroxysmal atrial fibrillation: Secondary | ICD-10-CM | POA: Diagnosis not present

## 2019-09-18 DIAGNOSIS — F0151 Vascular dementia with behavioral disturbance: Secondary | ICD-10-CM | POA: Diagnosis not present

## 2019-09-18 DIAGNOSIS — F062 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with delusions due to known physiological condition: Secondary | ICD-10-CM | POA: Diagnosis not present

## 2019-09-28 DIAGNOSIS — Z20822 Contact with and (suspected) exposure to covid-19: Secondary | ICD-10-CM | POA: Diagnosis not present

## 2019-09-28 DIAGNOSIS — Z1152 Encounter for screening for COVID-19: Secondary | ICD-10-CM | POA: Diagnosis not present

## 2019-10-01 DIAGNOSIS — R918 Other nonspecific abnormal finding of lung field: Secondary | ICD-10-CM | POA: Diagnosis not present

## 2019-10-02 DIAGNOSIS — F062 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with delusions due to known physiological condition: Secondary | ICD-10-CM | POA: Diagnosis not present

## 2019-10-02 DIAGNOSIS — F0151 Vascular dementia with behavioral disturbance: Secondary | ICD-10-CM | POA: Diagnosis not present

## 2019-10-05 DIAGNOSIS — F0151 Vascular dementia with behavioral disturbance: Secondary | ICD-10-CM | POA: Diagnosis not present

## 2019-10-05 DIAGNOSIS — F062 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with delusions due to known physiological condition: Secondary | ICD-10-CM | POA: Diagnosis not present

## 2019-10-06 DIAGNOSIS — G301 Alzheimer's disease with late onset: Secondary | ICD-10-CM | POA: Diagnosis not present

## 2019-10-06 DIAGNOSIS — R1312 Dysphagia, oropharyngeal phase: Secondary | ICD-10-CM | POA: Diagnosis not present

## 2019-10-06 DIAGNOSIS — R2681 Unsteadiness on feet: Secondary | ICD-10-CM | POA: Diagnosis not present

## 2019-10-06 DIAGNOSIS — M6281 Muscle weakness (generalized): Secondary | ICD-10-CM | POA: Diagnosis not present

## 2019-10-06 DIAGNOSIS — R296 Repeated falls: Secondary | ICD-10-CM | POA: Diagnosis not present

## 2019-10-09 DIAGNOSIS — F062 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with delusions due to known physiological condition: Secondary | ICD-10-CM | POA: Diagnosis not present

## 2019-10-09 DIAGNOSIS — F0151 Vascular dementia with behavioral disturbance: Secondary | ICD-10-CM | POA: Diagnosis not present

## 2019-10-16 DIAGNOSIS — F0630 Mood disorder due to known physiological condition, unspecified: Secondary | ICD-10-CM | POA: Diagnosis not present

## 2019-10-16 DIAGNOSIS — F3341 Major depressive disorder, recurrent, in partial remission: Secondary | ICD-10-CM | POA: Diagnosis not present

## 2019-10-16 DIAGNOSIS — F062 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with delusions due to known physiological condition: Secondary | ICD-10-CM | POA: Diagnosis not present

## 2019-10-17 ENCOUNTER — Emergency Department (HOSPITAL_COMMUNITY)
Admission: EM | Admit: 2019-10-17 | Discharge: 2019-10-28 | Disposition: A | Discharge status: Home / Self Care | Payer: PPO | Attending: Emergency Medicine | Admitting: Emergency Medicine

## 2019-10-17 ENCOUNTER — Encounter (HOSPITAL_COMMUNITY): Payer: Self-pay | Admitting: Emergency Medicine

## 2019-10-17 DIAGNOSIS — F29 Unspecified psychosis not due to a substance or known physiological condition: Secondary | ICD-10-CM | POA: Insufficient documentation

## 2019-10-17 DIAGNOSIS — Z85828 Personal history of other malignant neoplasm of skin: Secondary | ICD-10-CM | POA: Diagnosis not present

## 2019-10-17 DIAGNOSIS — Z87891 Personal history of nicotine dependence: Secondary | ICD-10-CM | POA: Insufficient documentation

## 2019-10-17 DIAGNOSIS — R05 Cough: Secondary | ICD-10-CM | POA: Diagnosis not present

## 2019-10-17 DIAGNOSIS — Z7982 Long term (current) use of aspirin: Secondary | ICD-10-CM | POA: Diagnosis not present

## 2019-10-17 DIAGNOSIS — F918 Other conduct disorders: Secondary | ICD-10-CM | POA: Insufficient documentation

## 2019-10-17 DIAGNOSIS — Z046 Encounter for general psychiatric examination, requested by authority: Secondary | ICD-10-CM | POA: Insufficient documentation

## 2019-10-17 DIAGNOSIS — I10 Essential (primary) hypertension: Secondary | ICD-10-CM | POA: Diagnosis not present

## 2019-10-17 DIAGNOSIS — R404 Transient alteration of awareness: Secondary | ICD-10-CM | POA: Diagnosis not present

## 2019-10-17 DIAGNOSIS — F0390 Unspecified dementia without behavioral disturbance: Secondary | ICD-10-CM | POA: Diagnosis not present

## 2019-10-17 DIAGNOSIS — Z96651 Presence of right artificial knee joint: Secondary | ICD-10-CM | POA: Insufficient documentation

## 2019-10-17 DIAGNOSIS — F0280 Dementia in other diseases classified elsewhere without behavioral disturbance: Secondary | ICD-10-CM | POA: Insufficient documentation

## 2019-10-17 DIAGNOSIS — E870 Hyperosmolality and hypernatremia: Secondary | ICD-10-CM | POA: Insufficient documentation

## 2019-10-17 DIAGNOSIS — Z79899 Other long term (current) drug therapy: Secondary | ICD-10-CM | POA: Insufficient documentation

## 2019-10-17 DIAGNOSIS — J449 Chronic obstructive pulmonary disease, unspecified: Secondary | ICD-10-CM | POA: Insufficient documentation

## 2019-10-17 DIAGNOSIS — R4689 Other symptoms and signs involving appearance and behavior: Secondary | ICD-10-CM

## 2019-10-17 DIAGNOSIS — R059 Cough, unspecified: Secondary | ICD-10-CM

## 2019-10-17 DIAGNOSIS — G309 Alzheimer's disease, unspecified: Secondary | ICD-10-CM | POA: Insufficient documentation

## 2019-10-17 DIAGNOSIS — I2510 Atherosclerotic heart disease of native coronary artery without angina pectoris: Secondary | ICD-10-CM | POA: Insufficient documentation

## 2019-10-17 DIAGNOSIS — Z8659 Personal history of other mental and behavioral disorders: Secondary | ICD-10-CM

## 2019-10-17 DIAGNOSIS — I959 Hypotension, unspecified: Secondary | ICD-10-CM | POA: Diagnosis not present

## 2019-10-17 DIAGNOSIS — R509 Fever, unspecified: Secondary | ICD-10-CM | POA: Diagnosis not present

## 2019-10-17 LAB — CBC WITH DIFFERENTIAL/PLATELET
Abs Immature Granulocytes: 0.02 10*3/uL (ref 0.00–0.07)
Basophils Absolute: 0 10*3/uL (ref 0.0–0.1)
Basophils Relative: 1 %
Eosinophils Absolute: 0.1 10*3/uL (ref 0.0–0.5)
Eosinophils Relative: 2 %
HCT: 30.1 % — ABNORMAL LOW (ref 39.0–52.0)
Hemoglobin: 9 g/dL — ABNORMAL LOW (ref 13.0–17.0)
Immature Granulocytes: 1 %
Lymphocytes Relative: 36 %
Lymphs Abs: 1.6 10*3/uL (ref 0.7–4.0)
MCH: 25.4 pg — ABNORMAL LOW (ref 26.0–34.0)
MCHC: 29.9 g/dL — ABNORMAL LOW (ref 30.0–36.0)
MCV: 84.8 fL (ref 80.0–100.0)
Monocytes Absolute: 0.8 10*3/uL (ref 0.1–1.0)
Monocytes Relative: 17 %
Neutro Abs: 1.9 10*3/uL (ref 1.7–7.7)
Neutrophils Relative %: 43 %
Platelets: 179 10*3/uL (ref 150–400)
RBC: 3.55 MIL/uL — ABNORMAL LOW (ref 4.22–5.81)
RDW: 16.2 % — ABNORMAL HIGH (ref 11.5–15.5)
WBC: 4.4 10*3/uL (ref 4.0–10.5)
nRBC: 0 % (ref 0.0–0.2)

## 2019-10-17 LAB — URINALYSIS, ROUTINE W REFLEX MICROSCOPIC
Bilirubin Urine: NEGATIVE
Glucose, UA: NEGATIVE mg/dL
Hgb urine dipstick: NEGATIVE
Ketones, ur: NEGATIVE mg/dL
Leukocytes,Ua: NEGATIVE
Nitrite: NEGATIVE
Protein, ur: NEGATIVE mg/dL
Specific Gravity, Urine: 1.018 (ref 1.005–1.030)
pH: 6 (ref 5.0–8.0)

## 2019-10-17 LAB — RAPID URINE DRUG SCREEN, HOSP PERFORMED
Amphetamines: NOT DETECTED
Barbiturates: NOT DETECTED
Benzodiazepines: POSITIVE — AB
Cocaine: NOT DETECTED
Opiates: NOT DETECTED
Tetrahydrocannabinol: NOT DETECTED

## 2019-10-17 LAB — BASIC METABOLIC PANEL
Anion gap: 6 (ref 5–15)
BUN: 30 mg/dL — ABNORMAL HIGH (ref 8–23)
CO2: 27 mmol/L (ref 22–32)
Calcium: 8.7 mg/dL — ABNORMAL LOW (ref 8.9–10.3)
Chloride: 104 mmol/L (ref 98–111)
Creatinine, Ser: 0.73 mg/dL (ref 0.61–1.24)
GFR calc Af Amer: >60 mL/min (ref >60)
GFR calc non Af Amer: >60 mL/min (ref >60)
Glucose, Bld: 88 mg/dL (ref 70–99)
Potassium: 3.9 mmol/L (ref 3.5–5.1)
Sodium: 137 mmol/L (ref 135–145)

## 2019-10-17 LAB — ETHANOL: Alcohol, Ethyl (B): <10 mg/dL (ref <10)

## 2019-10-17 MED ORDER — SENNOSIDES-DOCUSATE SODIUM 8.6-50 MG PO TABS
1.0000 | ORAL_TABLET | Freq: Two times a day (BID) | ORAL | Status: DC
  Administered 2019-10-18 – 2019-10-27 (×15): 1 via ORAL
  Filled 2019-10-17 (×26): qty 1

## 2019-10-17 MED ORDER — MIRTAZAPINE 15 MG PO TABS
7.5000 mg | ORAL_TABLET | Freq: Every day | ORAL | Status: DC
  Administered 2019-10-18 – 2019-10-26 (×8): 7.5 mg via ORAL
  Filled 2019-10-17 (×9): qty 1

## 2019-10-17 MED ORDER — SERTRALINE HCL 50 MG PO TABS
25.0000 mg | ORAL_TABLET | Freq: Every day | ORAL | Status: DC
  Administered 2019-10-18 – 2019-10-28 (×11): 25 mg via ORAL
  Filled 2019-10-17 (×11): qty 1

## 2019-10-17 MED ORDER — RISPERIDONE 1 MG PO TABS
0.5000 mg | ORAL_TABLET | Freq: Two times a day (BID) | ORAL | Status: DC
  Administered 2019-10-18 – 2019-10-21 (×7): 0.5 mg via ORAL
  Filled 2019-10-17 (×8): qty 1

## 2019-10-17 MED ORDER — TAMSULOSIN HCL 0.4 MG PO CAPS
0.4000 mg | ORAL_CAPSULE | Freq: Every day | ORAL | Status: DC
  Administered 2019-10-18 – 2019-10-28 (×11): 0.4 mg via ORAL
  Filled 2019-10-17 (×11): qty 1

## 2019-10-17 MED ORDER — QUETIAPINE FUMARATE 100 MG PO TABS
100.0000 mg | ORAL_TABLET | Freq: Every day | ORAL | Status: DC
  Administered 2019-10-18 – 2019-10-21 (×3): 100 mg via ORAL
  Filled 2019-10-17 (×4): qty 1

## 2019-10-17 MED ORDER — LORAZEPAM 1 MG PO TABS
1.0000 mg | ORAL_TABLET | Freq: Four times a day (QID) | ORAL | Status: DC | PRN
  Administered 2019-10-18 – 2019-10-27 (×16): 1 mg via ORAL
  Filled 2019-10-17 (×16): qty 1

## 2019-10-17 MED ORDER — VITAMIN D3 25 MCG PO TABS
1000.0000 [IU] | ORAL_TABLET | Freq: Every day | ORAL | Status: DC
  Administered 2019-10-18 – 2019-10-28 (×10): 1000 [IU] via ORAL
  Filled 2019-10-17 (×14): qty 1

## 2019-10-17 MED ORDER — DIVALPROEX SODIUM 125 MG PO CSDR
250.0000 mg | DELAYED_RELEASE_CAPSULE | Freq: Two times a day (BID) | ORAL | Status: DC
  Administered 2019-10-18 – 2019-10-22 (×7): 250 mg via ORAL
  Filled 2019-10-17 (×15): qty 2

## 2019-10-17 NOTE — ED Notes (Signed)
Pt has attempted several times to get OOB.  Pt follows commands when ask to get back in bed.

## 2019-10-17 NOTE — ED Triage Notes (Signed)
Pt brought in for aggressive behavior from [facility]. Pt has had several recent med changes upon review of MAR. Polite and cooperative at this time.
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
## 2019-10-17 NOTE — ED Notes (Signed)
TTS has completed.

## 2019-10-17 NOTE — ED Provider Notes (Signed)
Blood pressure 125/68, pulse (!) 57, temperature 98 F (36.7 C), temperature source Oral, resp. rate 20, height 6' (1.829 m), weight 62 kg, SpO2 100 %.  Assuming care from Dr. Stark Jock.  In short, [name] [name] is a 79 y.o. male with a chief complaint of V70.1 and Aggressive Behavior .  Refer to the original H&P for additional details.  The current plan of care is to f/u on TTS evaluation and recommendation.   05:00 PM  TTS evaluated and recommends inpatient treatment. They are looking for placement. Will confirm home meds and re-order.   Home meds re-ordered. Patient's wife does not want him sent to a facility but is unwilling to take him back home with her. His current facility is unwilling to take him back without change in behavior and med adjustment. TTS aware and in discussion with wife and facility. They are looking for placement. Home meds ordered. Patient is medically clear.    Margette Fast, MD 10/17/19 2110

## 2019-10-17 NOTE — Progress Notes (Signed)
Patient meets criteria for inpatient treatment per Ricky Ala, NP. No appropriate beds at [facility] currently. CSW faxed referrals to the following facilities for review:  [facility]   [facility]   [facility]   [facility]    TTS will continue to seek bed placement.     Darletta Moll MSW, [place] Worker Disposition  [facility] Ph: [phone] Fax: [phone] 10/17/2019 3:30 PM

## 2019-10-18 DIAGNOSIS — F918 Other conduct disorders: Secondary | ICD-10-CM | POA: Diagnosis not present

## 2019-10-18 NOTE — BHH Counselor (Signed)
  REASSESSMENT  [place] reevaluated pt for safety and stability.  Pt was observed laying in the bed and appeared restless.  Pt was pleasant and cooperated to the best of his ability but was not oriented and unable to verify his name or DOB.  When pt was asked how was he feeling?  Pt responded "I feel better."  When pt was asked if he wanted to hurt himself?  Pt responded, "no".  When pt was asked if he saw anything that others could not see?  Pt responded "no".  When pt was asked if he wanted to hurt anyone?  Pt responded "well I guess just 1 person, when they started talking."   Pt continues to meet inpatient criteria. TTS will continue to look for inpatient placement.  Rayleigh Gillyard L. [place], [place], [facility], [facility] Therapeutic Triage Specialist  [phone]

## 2019-10-18 NOTE — ED Notes (Signed)
When giving pt meds, pt took meds in mouth and then spit out all of medication. reattempted to give pt his meds and pt kept spitting them out. meds placed in sharps bin.

## 2019-10-19 ENCOUNTER — Other Ambulatory Visit: Payer: Self-pay

## 2019-10-19 DIAGNOSIS — F918 Other conduct disorders: Secondary | ICD-10-CM | POA: Diagnosis not present

## 2019-10-19 NOTE — Progress Notes (Signed)
Patient continues to meet inpatient criteria. Patient has been faxed out to the following facilities for review:   [facility]  [facility]  CSW will continue to follow and assist with disposition planning.   Domenic Schwab, MSW, LCSW-A Clinical Disposition Social Worker [facility]/TTS [phone]

## 2019-10-19 NOTE — BHH Counselor (Signed)
Pt did not appear oriented at all. Pt was unable to participate in assessment but was able to open his eyes for a few minutes. Throughout the remainder of assessment Pt kept eyes shut and did not respond to questions.   Pt continues to meet inpatient criteria and will continue to be assessed.

## 2019-10-19 NOTE — ED Notes (Signed)
Assisted NT to change pt's brief and bed.  Pt trying to put legs over bed railing, difficult to redirect.

## 2019-10-19 NOTE — ED Notes (Signed)
Pt is sleeping, meal tray set on bedside table.

## 2019-10-20 DIAGNOSIS — F918 Other conduct disorders: Secondary | ICD-10-CM | POA: Diagnosis not present

## 2019-10-20 LAB — VALPROIC ACID LEVEL: Valproic Acid Lvl: 19 ug/mL — ABNORMAL LOW (ref 50.0–100.0)

## 2019-10-20 MED ORDER — ZIPRASIDONE MESYLATE 20 MG IM SOLR
10.0000 mg | Freq: Once | INTRAMUSCULAR | Status: AC
  Administered 2019-10-20: 10 mg via INTRAMUSCULAR
  Filled 2019-10-20: qty 20

## 2019-10-20 MED ORDER — LORAZEPAM 2 MG/ML IJ SOLN
1.0000 mg | Freq: Once | INTRAMUSCULAR | Status: AC
  Administered 2019-10-20: 1 mg via INTRAMUSCULAR
  Filled 2019-10-20: qty 1

## 2019-10-20 NOTE — ED Provider Notes (Signed)
The patient has been placed in psychiatric observation due to the need to provide a safe environment for the patient while obtaining psychiatric consultation and evaluation, as well as ongoing medical and medication management to treat the patient's condition.  The patient has not been placed under full IVC at this time.    Ripley Fraise, MD 10/20/19 [phone]

## 2019-10-20 NOTE — BH Assessment (Signed)
[place] Assessment Progress Note   TTS was going to reassess patient, but he has been taken out of his restraints and has been sleeping all day without any behavior issues.  TTS will try later this afternoon to reassess patient when he is awake.

## 2019-10-20 NOTE — ED Notes (Signed)
Pt meal tray placed on bedside table. Pt resting at this time. 

## 2019-10-20 NOTE — ED Notes (Signed)
Patient refused medications. Patient spit medications out onto floor even after mixed with applesauce.  Patient refused meals, chewing some bites then spitting all food onto floor.

## 2019-10-21 DIAGNOSIS — F918 Other conduct disorders: Secondary | ICD-10-CM | POA: Diagnosis not present

## 2019-10-21 LAB — COMPREHENSIVE METABOLIC PANEL
ALT: 17 U/L (ref 0–44)
AST: 15 U/L (ref 15–41)
Albumin: 3.5 g/dL (ref 3.5–5.0)
Alkaline Phosphatase: 68 U/L (ref 38–126)
Anion gap: 9 (ref 5–15)
BUN: 24 mg/dL — ABNORMAL HIGH (ref 8–23)
CO2: 26 mmol/L (ref 22–32)
Calcium: 9.4 mg/dL (ref 8.9–10.3)
Chloride: 103 mmol/L (ref 98–111)
Creatinine, Ser: 0.86 mg/dL (ref 0.61–1.24)
GFR calc Af Amer: >60 mL/min (ref >60)
GFR calc non Af Amer: >60 mL/min (ref >60)
Glucose, Bld: 103 mg/dL — ABNORMAL HIGH (ref 70–99)
Potassium: 4.2 mmol/L (ref 3.5–5.1)
Sodium: 138 mmol/L (ref 135–145)
Total Bilirubin: 0.6 mg/dL (ref 0.3–1.2)
Total Protein: 7.4 g/dL (ref 6.5–8.1)

## 2019-10-21 LAB — TSH: TSH: 2.753 u[IU]/mL (ref 0.350–4.500)

## 2019-10-21 MED ORDER — QUETIAPINE FUMARATE 100 MG PO TABS
100.0000 mg | ORAL_TABLET | Freq: Every day | ORAL | Status: DC
  Administered 2019-10-22: 10:00:00 100 mg via ORAL
  Filled 2019-10-21: qty 1

## 2019-10-21 MED ORDER — QUETIAPINE FUMARATE 100 MG PO TABS
200.0000 mg | ORAL_TABLET | Freq: Every day | ORAL | Status: DC
  Administered 2019-10-21 – 2019-10-26 (×6): 200 mg via ORAL
  Filled 2019-10-21 (×6): qty 2

## 2019-10-21 NOTE — Progress Notes (Signed)
I spoke with patient's wife Russelene Mandrell [phone]) as well as Jeannette How. Supervisor reports Seroquel was decreased as part of routine gradual dose reduction reviews done in their facility to try to decrease the number of psychotropic medications that residents receive. She did not know why Risperdal was started. She denied any knowledge of Mr. Wannamaker having side effects with higher dose of Seroquel.  Ms. Dyess reports patient's behaviors were better managed with Seroquel 200 mg QAM, 400 mg QSH. She reports that patient had poor appetite while on higher dose of Seroquel and that this seemed to improve when Seroquel was decreased. She is unsure if Remeron was started at the same time as Seroquel dose reduction. She agrees to plan to discontinue Risperdal and increase Seroquel to 100 mg QAM, 200 mg QHS. EKG, TSH, CMP pending.

## 2019-10-21 NOTE — ED Notes (Signed)
Tracker for nursing home cut off left wrist due to redness.

## 2019-10-21 NOTE — Progress Notes (Signed)
Patient ID: [name] [name], male   DOB: 06-15-1941, 79 y.o.   MRN: [id number]   As per TTS counselor, patients wife stated that patient was previously prescribed 600 mg of Seroquel daily and his facility decreased it to 100 mg. She added that he (patient) has decompensated since then and she stated she would like him to go back to the 600 mg. Following review of chart, it was noted that patient is also on Risperdal 0.5 mg po bid. I discussed patients wife concerns with psychiatrist. It is recommended that we make contact with [facility] extended care facility to see why the Seroquel was decreased. If there are significant risk associated with decrease, Risperdal will be increased, if not, it is recommended to increase the Seroquel to 100 mg po daily and 200 mg po daily at bedtime. I will follow-up with patients wife after 2:00 pm as requested. Prior to adjustments to medications, labs are needed to include; EKG, TSH, and CMP. I will place lab orders.

## 2019-10-21 NOTE — Progress Notes (Signed)
Referral information has been re-faxed to the following hospitals for review:  [facility] Details [facility] Details  [facility] Details [facility] Details  [facility] Details  [facility] Details [facility] Details [facility] Details [place] Details  [place] Details [facility] Details  [facility]  Disposition will continue to assist with inpatient placement needs.   Audree Camel, LCSW, [place] Disposition [facility]/TTS [phone]

## 2019-10-21 NOTE — BH Assessment (Signed)
TTS reassessment: Patient presents laying in bed asleep. He is unable to be roused for assessment. Patient's wife, [name] [name], is at bedside and provides collateral information. She states patient was previous prescribed 600 mg of Seroquel daily and his facility decreased it to 100 mg. She states he has decompensated since then and she would like him to go back to the 600 mg. She states this is not patient's baseline as he typically walks around his facility all day long. She also expressed concern that patient has not been accepted to a psych hospital. This counselor provided patient with an update on referral process. Wife had numerous questions about medications currently being prescribed. This counselor read from patient's [facility] but also stated that medications are out of my scope of practice but that our provider could discuss them.   [name] [name] requests [facility] provider contact her after 2:00 pm this afternoon at [phone] to discuss meds. [name] Maes, NP notified of request.

## 2019-10-21 NOTE — Progress Notes (Signed)
CSW spoke with Caryl Pina in admissions at [facility]. Pt has been denied admission due to aggression.   Audree Camel, LCSW, [place] Disposition [facility]/TTS [phone]

## 2019-10-22 DIAGNOSIS — F918 Other conduct disorders: Secondary | ICD-10-CM | POA: Diagnosis not present

## 2019-10-22 MED ORDER — QUETIAPINE FUMARATE 100 MG PO TABS: 200.0000 mg | ORAL_TABLET | Freq: Every day | ORAL | Status: DC

## 2019-10-22 MED ORDER — DIVALPROEX SODIUM 125 MG PO CSDR
500.0000 mg | DELAYED_RELEASE_CAPSULE | Freq: Two times a day (BID) | ORAL | Status: DC
  Administered 2019-10-22 – 2019-10-28 (×11): 500 mg via ORAL
  Filled 2019-10-22 (×16): qty 4

## 2019-10-22 MED ORDER — QUETIAPINE FUMARATE 100 MG PO TABS
400.0000 mg | ORAL_TABLET | Freq: Every day | ORAL | Status: DC
  Administered 2019-10-23 – 2019-10-28 (×6): 400 mg via ORAL
  Filled 2019-10-22 (×6): qty 4

## 2019-10-22 MED ORDER — ENSURE ENLIVE PO LIQD
237.0000 mL | Freq: Two times a day (BID) | ORAL | Status: DC
  Administered 2019-10-22 – 2019-10-28 (×7): 237 mL via ORAL
  Filled 2019-10-22 (×15): qty 237

## 2019-10-22 NOTE — Progress Notes (Addendum)
Patient ID: [name] [name], male   DOB: February 09, 1941, 79 y.o.   MRN: [id number]   Psychiatric reassessment  In brief; [name] [name] is a 79 y.o. male with past medical history of dementia, COPD, CAD, A. fib. Patient presented to [facility]  from his extended care facility [facility]) for evaluation of aggressive behavior.   Patient re-evaluated for psychiatric concerns. During this evaluation, he initially  was aroused however, he fell asleep shortly after the evaluation was started. Prior to the evaluation, I spoke to patients nurse who reported that last night, patient was agitated and combative thus, restraints were required. She added that patient is spitting out his food, incontinent, and making attempts to climb out of bed. Patient remains in restraints at this time. I discussed with [facility] psychiatrist patients behaviors and medication recommendations made. We will titrate his Depakote to 500 mg po bid as current valproic level is 19.  We will also titrate his Seroquel to 200 mg po QAM and 400 mg po QHS. Reviewed labs from yesterday and his TSH is normal, EKG shows no concerns with QTc interval and CMP normal besides slightly elevated glucose 103 and BUN 24. We will continue to monitor patients progress. Geropsychiatry continues to be recommended. Repeat valproic level in 3 days following dose adjustment which has been ordered.

## 2019-10-23 ENCOUNTER — Encounter (HOSPITAL_COMMUNITY): Payer: Self-pay | Admitting: Emergency Medicine

## 2019-10-23 DIAGNOSIS — F918 Other conduct disorders: Secondary | ICD-10-CM | POA: Diagnosis not present

## 2019-10-23 NOTE — Progress Notes (Signed)
Patient continues to meets criteria for inpatient treatment per Mordecai Maes, NP. No appropriate beds at [facility] currently. CSW faxed referrals to the following facilities for review:  [facility] Details  [facility] Details  [facility] Details  [facility] Details  [facility] Details  [facility] Details  [facility] Details  [facility] Details  [place] Details  [place] Details  [facility] Details  [facility] Office  TTS will continue to seek bed placement.     Darletta Moll MSW, [place] Worker Disposition  [facility] Ph: [phone] Fax: [phone] 10/23/2019 2:46 PM

## 2019-10-23 NOTE — ED Notes (Signed)
While Pt was receiving Depakote Capsules, Pt spit 2 capsules on the floor. Pt did however receive and swallow remaining medication. Pt comfortably resting at this time. Pt repositioned and covered with warm blanket.

## 2019-10-24 DIAGNOSIS — F918 Other conduct disorders: Secondary | ICD-10-CM | POA: Diagnosis not present

## 2019-10-24 NOTE — BHH Counselor (Signed)
Re-assessment per report: E[place], RN report today patient has been fine. Report today patient has not had any outburst and is not in any restraints.    Ricky Ala, NP, continue to recommend gero-psy.

## 2019-10-24 NOTE — ED Notes (Signed)
Pt awake and having loud verbal outbursts. Pt has been provided warm blankets but continues to throw them off and has attempted to swing arm at this RN while screaming "I want out!" This RN has attempted to verbally de-escalate Pt and assure him of his safety.

## 2019-10-24 NOTE — Progress Notes (Signed)
CSW faxed referral to [facility].  Darletta Moll MSW, [place] Worker Disposition  [facility] Ph: [phone] Fax: [phone]

## 2019-10-25 DIAGNOSIS — F918 Other conduct disorders: Secondary | ICD-10-CM | POA: Diagnosis not present

## 2019-10-25 LAB — VALPROIC ACID LEVEL: Valproic Acid Lvl: 60 ug/mL (ref 50.0–100.0)

## 2019-10-25 MED ORDER — ZIPRASIDONE MESYLATE 20 MG IM SOLR: 10.0000 mg | Freq: Once | INTRAMUSCULAR | Status: AC

## 2019-10-25 MED ORDER — ZIPRASIDONE MESYLATE 20 MG IM SOLR
INTRAMUSCULAR | Status: AC
  Administered 2019-10-25: 10 mg via INTRAMUSCULAR
  Filled 2019-10-25: qty 20

## 2019-10-25 NOTE — Progress Notes (Signed)
CSW spoke with Maudie Mercury with admissions at [facility] who reported they could not locate referral for this pt. CSW re-faxed referral to [facility].  Darletta Moll MSW, [place] Worker Disposition  [facility] Ph: [phone] Fax: [phone]

## 2019-10-25 NOTE — ED Notes (Signed)
Patient alert with sitter at bedside assisting with breakfast.
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
## 2019-10-26 ENCOUNTER — Emergency Department (HOSPITAL_COMMUNITY): Payer: PPO

## 2019-10-26 DIAGNOSIS — R05 Cough: Secondary | ICD-10-CM | POA: Diagnosis not present

## 2019-10-26 DIAGNOSIS — R509 Fever, unspecified: Secondary | ICD-10-CM | POA: Diagnosis not present

## 2019-10-26 DIAGNOSIS — F918 Other conduct disorders: Secondary | ICD-10-CM | POA: Diagnosis not present

## 2019-10-26 LAB — COMPREHENSIVE METABOLIC PANEL
ALT: 16 U/L (ref 0–44)
AST: 19 U/L (ref 15–41)
Albumin: 3.3 g/dL — ABNORMAL LOW (ref 3.5–5.0)
Alkaline Phosphatase: 74 U/L (ref 38–126)
Anion gap: 12 (ref 5–15)
BUN: 37 mg/dL — ABNORMAL HIGH (ref 8–23)
CO2: 27 mmol/L (ref 22–32)
Calcium: 9.6 mg/dL (ref 8.9–10.3)
Chloride: 109 mmol/L (ref 98–111)
Creatinine, Ser: 1.01 mg/dL (ref 0.61–1.24)
GFR calc Af Amer: >60 mL/min (ref >60)
GFR calc non Af Amer: >60 mL/min (ref >60)
Glucose, Bld: 122 mg/dL — ABNORMAL HIGH (ref 70–99)
Potassium: 4.3 mmol/L (ref 3.5–5.1)
Sodium: 148 mmol/L — ABNORMAL HIGH (ref 135–145)
Total Bilirubin: 0.4 mg/dL (ref 0.3–1.2)
Total Protein: 8.4 g/dL — ABNORMAL HIGH (ref 6.5–8.1)

## 2019-10-26 LAB — CBC WITH DIFFERENTIAL/PLATELET
Abs Immature Granulocytes: 0.05 10*3/uL (ref 0.00–0.07)
Basophils Absolute: 0.1 10*3/uL (ref 0.0–0.1)
Basophils Relative: 1 %
Eosinophils Absolute: 0 10*3/uL (ref 0.0–0.5)
Eosinophils Relative: 0 %
HCT: 39.7 % (ref 39.0–52.0)
Hemoglobin: 11.7 g/dL — ABNORMAL LOW (ref 13.0–17.0)
Immature Granulocytes: 1 %
Lymphocytes Relative: 8 %
Lymphs Abs: 0.8 10*3/uL (ref 0.7–4.0)
MCH: 24.2 pg — ABNORMAL LOW (ref 26.0–34.0)
MCHC: 29.5 g/dL — ABNORMAL LOW (ref 30.0–36.0)
MCV: 82.2 fL (ref 80.0–100.0)
Monocytes Absolute: 1.5 10*3/uL — ABNORMAL HIGH (ref 0.1–1.0)
Monocytes Relative: 15 %
Neutro Abs: 7.7 10*3/uL (ref 1.7–7.7)
Neutrophils Relative %: 75 %
Platelets: 257 10*3/uL (ref 150–400)
RBC: 4.83 MIL/uL (ref 4.22–5.81)
RDW: 17.3 % — ABNORMAL HIGH (ref 11.5–15.5)
WBC: 10.1 10*3/uL (ref 4.0–10.5)
nRBC: 0 % (ref 0.0–0.2)

## 2019-10-26 NOTE — Progress Notes (Signed)
Started [place] application. Will need authorization from [place] to their authorization review.   CSW is awaiting a phone call.   Domenic Schwab, MSW, LCSW-A Clinical Disposition Social Worker [facility]/TTS [phone]

## 2019-10-26 NOTE — ED Notes (Addendum)
Jeneen Rinks, PT, states patient is too combative for PT this am.

## 2019-10-26 NOTE — ED Notes (Signed)
Pt meal tray placed on bedside table. Pt resting at this time. 

## 2019-10-26 NOTE — ED Notes (Addendum)
PT put legs through the stretcher railing and stood up holding tightly onto railing. PT pulled condom cath off. Assisted pt back into bed with Wells Guiles, RN and [place], [place]. Placed brief on pt.

## 2019-10-26 NOTE — Progress Notes (Signed)
Pt referral information refaxed to the following hospitals for review:   [facility] Details  [facility] Details [facility] Details  [facility] Details  [facility] Details [facility] Details [facility] Details [place] Details  [place] Details [facility] Details  [facility]  Disposition will continue to follow for inpatient placement needs.   Audree Camel, LCSW, [place] Disposition [facility]/TTS [phone]

## 2019-10-26 NOTE — Progress Notes (Signed)
PT Cancellation Note  Patient Details Name: [name] [name] MRN: [id number] DOB: Jun 07, 1941   Cancelled Treatment:    Reason Eval/Treat Not Completed: Patient declined, no reason specified.  Patient not appropriate for skilled physical therapy at this time due to combative behavior and refusing to attempt functional mobility even with encouragement and repeated attempts.  Recommendation:  If patient's behavior changes and he becomes cooperative, then re-order physical therapy.      9:50 AM, 10/26/19 Lonell Grandchild, MPT Physical Therapist with [facility] [phone] office [phone] mobile phone

## 2019-10-26 NOTE — ED Notes (Signed)
Received report from previous Rn, pt lying on left side, will arouse when stimulated, sitter at bedside,

## 2019-10-26 NOTE — ED Provider Notes (Signed)
Emergency Medicine Observation Re-evaluation Note  [name] [name] is a 79 y.o. male, seen on rounds today.  Pt initially presented to the ED for complaints of Aggressive Behavior Currently, the patient is boarding in the ED waiting on placement in [place] facility. Patient is confused and exhibiting mild/moderate agitation at times. PT could not work with the patient this AM. Case Mgmt continues to reach out to facilities.   Physical Exam  BP (!) 150/65 (BP Location: Right Arm)   Pulse 81   Temp 98.7 F (37.1 C) (Axillary)   Resp 20   Ht 6' (1.829 m)   Wt 62 kg   SpO2 96%   BMI 18.54 kg/m  Physical Exam   General: Alert. Redirectable.  Resp: Comfortable, non-labored respirations.  Abd: No distension.   ED Course / MDM  EKG:EKG Interpretation  Date/Time:  Wednesday October 21 2019 13:53:09 EDT Ventricular Rate:  76 PR Interval:  168 QRS Duration: 86 QT Interval:  382 QTC Calculation: 429 R Axis:   -24 Text Interpretation: Normal sinus rhythm with sinus arrhythmia Minimal voltage criteria for LVH, may be normal variant ( Cornell product ) Nonspecific ST abnormality Abnormal ECG Confirmed by Nat Christen [phone]) on 10/22/2019 8:39:43 AM    I have reviewed the labs performed to date as well as medications administered while in observation.  Recent changes in the last 24 hours include repeat labs showing up-trending sodium but otherwise unremarkable.  Plan  Current plan is for continued Geri Psych placement and puch PO fluids.   Consider repeat labs tomorrow AM to reassess sodium. Will place this timed order.    Margette Fast, MD 10/26/19 [phone]

## 2019-10-27 DIAGNOSIS — F918 Other conduct disorders: Secondary | ICD-10-CM | POA: Diagnosis not present

## 2019-10-27 LAB — BASIC METABOLIC PANEL
Anion gap: 9 (ref 5–15)
BUN: 44 mg/dL — ABNORMAL HIGH (ref 8–23)
CO2: 30 mmol/L (ref 22–32)
Calcium: 9.7 mg/dL (ref 8.9–10.3)
Chloride: 113 mmol/L — ABNORMAL HIGH (ref 98–111)
Creatinine, Ser: 1.05 mg/dL (ref 0.61–1.24)
GFR calc Af Amer: >60 mL/min (ref >60)
GFR calc non Af Amer: >60 mL/min (ref >60)
Glucose, Bld: 122 mg/dL — ABNORMAL HIGH (ref 70–99)
Potassium: 4.2 mmol/L (ref 3.5–5.1)
Sodium: 152 mmol/L — ABNORMAL HIGH (ref 135–145)

## 2019-10-27 MED ORDER — QUETIAPINE FUMARATE 25 MG PO TABS: 250.0000 mg | ORAL_TABLET | Freq: Every day | ORAL | Status: DC

## 2019-10-27 MED ORDER — ZIPRASIDONE MESYLATE 20 MG IM SOLR
10.0000 mg | Freq: Once | INTRAMUSCULAR | Status: AC
  Administered 2019-10-27: 10 mg via INTRAMUSCULAR
  Filled 2019-10-27: qty 20

## 2019-10-27 MED ORDER — STERILE WATER FOR INJECTION IJ SOLN
INTRAMUSCULAR | Status: AC
  Administered 2019-10-27: 1.2 mL
  Filled 2019-10-27: qty 10

## 2019-10-27 NOTE — ED Notes (Signed)
Patients' wife came to visit for at least an hour today. She fed the patient lunch and he did not spit out food. The patients' wife states the patient is at his baseline prior to his stay in the ED.

## 2019-10-27 NOTE — Progress Notes (Addendum)
CSW left voice message with Lynnae Sandhoff, [facility], requesting a return call to discuss pt's disposition.   Audree Camel, LCSW, [place] Disposition CSW [facility] BHH/TTS [phone]  UPDATE: CSW spoke with pt's spouse who stated that Lynnae Sandhoff is not working today. She suggested that CSW contact Sharlett Iles, Nursing Director [phone]), which this writer did. CSW left voice message requesting a return phone call.    1140am Received phone call from social worker Kayla [phone]). [facility] will accept back to their facility as long as he has not been in restraints for a 24 hour period.

## 2019-10-27 NOTE — ED Notes (Signed)
Patient is grabbing in the air at object that are not present.

## 2019-10-27 NOTE — ED Provider Notes (Signed)
Emergency Medicine Observation Re-evaluation Note  RAVEN THEYS is a 79 y.o. male, seen on rounds today.  Pt initially presented to the ED for complaints of Aggressive Behavior Currently, the patient is resting comfortably.  Physical Exam  BP (!) 136/95 (BP Location: Right Arm)   Pulse (!) 102   Temp (!) 97.5 F (36.4 C) (Oral)   Resp 18   Ht 6' (1.829 m)   Wt 62 kg   SpO2 95%   BMI 18.54 kg/m  Physical Exam  ED Course / MDM  EKG:EKG Interpretation  Date/Time:  Wednesday October 21 2019 13:53:09 EDT Ventricular Rate:  76 PR Interval:  168 QRS Duration: 86 QT Interval:  382 QTC Calculation: 429 R Axis:   -24 Text Interpretation: Normal sinus rhythm with sinus arrhythmia Minimal voltage criteria for LVH, may be normal variant ( Cornell product ) Nonspecific ST abnormality Abnormal ECG Confirmed by Nat Christen [phone]) on 10/22/2019 8:39:43 AM    I have reviewed the labs performed to date as well as medications administered while in observation.  Recent changes in the last 24 hours include TOC still persuing placement. Intermittent  aggitation Plan  Current plan is for placement. Patient is not under full IVC at this time.  Repeat chemistry came back this morning and his sodium is risen to 152.  Reviewed with nurse and will continue to encourage fluids p.o.   Hayden Rasmussen, MD 10/27/19 [phone]

## 2019-10-27 NOTE — ED Notes (Signed)
Pt was given meal tray, pt is resting at this time.

## 2019-10-27 NOTE — NC FL2 (Signed)
[place] LEVEL OF CARE SCREENING TOOL     IDENTIFICATION  Patient Name: [name] [name] Birthdate: 1940-10-15 Sex: male Admission Date (Current Location): 10/17/2019  [place] and [place] Number:  [place] [id number] [place] and Address:  [place] [address] [place]      Provider Number: [id number]  Attending Physician Name and Address:  Default, Provider, MD  Relative Name and Phone Number:  Soryn Grazioli, Spouse, [phone]    Current Level of Care: Hospital Recommended Level of Care: [place] Prior Approval Number:    Date Approved/Denied:   PASRR Number: [id number]  Discharge Plan: SNF    Current Diagnoses: Patient Active Problem List   Diagnosis Date Noted  . Sepsis ([place]) 12/03/2016  . Fever, unspecified 12/03/2016  . Tachycardia 12/03/2016  . Urinary tract infection without hematuria   . Hyperlipidemia   . Dyspnea on exertion 01/31/2016  . Pneumothorax 07/13/2014  . Pulmonary infiltrates 07/08/2014  . History of PSVT (paroxysmal supraventricular tachycardia) 03/16/2013  . S/P right TKA 10/08/2012  . Bilateral carotid artery occlusion 12/04/2010  . HYPERLIPIDEMIA 11/10/2008  . HYPERTENSION, BENIGN 11/10/2008  . CAD, NATIVE VESSEL 11/10/2008    Orientation RESPIRATION BLADDER Height & Weight     Self, Place  Normal Incontinent Weight: 62 kg Height:  6' (182.9 cm)  BEHAVIORAL SYMPTOMS/MOOD NEUROLOGICAL BOWEL NUTRITION STATUS      Incontinent Diet(Dys 3/soft-pureed diet)  AMBULATORY STATUS COMMUNICATION OF NEEDS Skin   Extensive Assist Verbally Normal                       Personal Care Assistance Level of Assistance  Bathing, Feeding, Dressing Bathing Assistance: Maximum assistance Feeding assistance: Maximum assistance Dressing Assistance: Maximum assistance     Functional Limitations Info  Sight, Hearing, Speech Sight Info: Adequate Hearing Info: Adequate Speech Info:  Adequate    SPECIAL CARE FACTORS FREQUENCY  OT (By licensed OT), PT (By licensed PT)     PT Frequency: 3x/wk OT Frequency: 3x/wk            Contractures Contractures Info: Not present    Additional Factors Info  Code Status, Allergies, Psychotropic Code Status Info: DNR Allergies Info: : Namenda (Memantine Hcl) Psychotropic Info: seroquel 250mg  (bedtime), 400mg  (daily); zoloft 25mg  (daily); depakote sprinkle 500mg  (2x daily); remeron 7.5mg  (bedtime)         Current Medications (10/27/2019):  This is the current hospital active medication list Current Facility-Administered Medications  Medication Dose Route Frequency Provider Last Rate Last Admin  . divalproex (DEPAKOTE SPRINKLE) capsule 500 mg  500 mg Oral BID Mordecai Maes, NP   500 mg at 10/27/19 1025  . feeding supplement (ENSURE ENLIVE) (ENSURE ENLIVE) liquid 237 mL  237 mL Oral BID BM Nat Christen, MD   237 mL at 10/27/19 1238  . LORazepam (ATIVAN) tablet 1 mg  1 mg Oral Q6H PRN Long, Wonda Olds, MD   1 mg at 10/27/19 1253  . mirtazapine (REMERON) tablet 7.5 mg  7.5 mg Oral QHS Long, Wonda Olds, MD   7.5 mg at 10/26/19 2117  . QUEtiapine (SEROQUEL) tablet 250 mg  250 mg Oral QHS Sharma Covert, MD      . QUEtiapine (SEROQUEL) tablet 400 mg  400 mg Oral Daily Mordecai Maes, NP   400 mg at 10/27/19 1025  . senna-docusate (Senokot-S) tablet 1 tablet  1 tablet Oral BID Long, Wonda Olds, MD   1 tablet at 10/27/19 1025  .  sertraline (ZOLOFT) tablet 25 mg  25 mg Oral Daily Long, Wonda Olds, MD   25 mg at 10/27/19 1026  . tamsulosin (FLOMAX) capsule 0.4 mg  0.4 mg Oral Daily Long, Wonda Olds, MD   0.4 mg at 10/27/19 1025  . Vitamin D3 (Vitamin D) tablet 1,000 Units  1,000 Units Oral Daily Long, Wonda Olds, MD   1,000 Units at 10/27/19 1025   Current Outpatient Medications  Medication Sig Dispense Refill  . acetaminophen (TYLENOL 8 HOUR) 650 MG CR tablet Take 650 mg by mouth in the morning, at noon, and at bedtime.     .  cholecalciferol (VITAMIN D) 1000 units tablet Take 1,000 Units by mouth daily.    . divalproex (DEPAKOTE SPRINKLE) 125 MG capsule Take 250 mg by mouth 2 (two) times daily.     Marland Kitchen LORazepam (ATIVAN) 1 MG tablet Take 1 mg by mouth in the morning, at noon, and at bedtime.     Marland Kitchen LORazepam (ATIVAN) 2 MG/ML injection Inject 1 mg into the vein every 8 (eight) hours as needed (increased agitation).    . mirtazapine (REMERON) 7.5 MG tablet Take 7.5 mg by mouth at bedtime.    Marland Kitchen omeprazole (PRILOSEC) 20 MG capsule Take 1 capsule (20 mg total) by mouth 2 (two) times daily before a meal. (Patient taking differently: Take 20 mg by mouth in the morning. ) 28 capsule 0  . polyethylene glycol powder (GLYCOLAX/MIRALAX) 17 GM/SCOOP powder Take 17 g by mouth daily.    . QUEtiapine (SEROQUEL) 100 MG tablet Take 100 mg by mouth at bedtime.     . risperiDONE (RISPERDAL) 0.5 MG tablet Take 0.5 mg by mouth 2 (two) times daily.    Marland Kitchen senna-docusate (SENNA-PLUS) 8.6-50 MG tablet Take 1 tablet by mouth 2 (two) times daily.    . sertraline (ZOLOFT) 25 MG tablet Take 25 mg by mouth daily.    . Tamsulosin HCl (FLOMAX) 0.4 MG CAPS Take 0.4 mg by mouth daily.     . ondansetron (ZOFRAN ODT) 4 MG disintegrating tablet Take 1 tablet (4 mg total) by mouth every 8 (eight) hours as needed for nausea or vomiting. (Patient not taking: Reported on 10/17/2019) 10 tablet 0     Discharge Medications: Please see discharge summary for a list of discharge medications.  Relevant Imaging Results:  Relevant Lab Results:   Additional Information SSN: [id number]; Pending negative covid screen  Korey Prashad B Rhiann Boucher, LCSWA

## 2019-10-27 NOTE — ED Notes (Signed)
Patient was being assisted with eating his lunch tray. This nurse witnessed the patient ask for a drink of his Ensure, then purposefully spit it out across the room onto the floor. Patient was witnessed doing the same with his tea. Patient is restless.

## 2019-10-27 NOTE — Progress Notes (Signed)
Patient ID: [name] [name], male   DOB: [birth year], 79 y.o.   MRN: [id number]   Psychiatric reassessment  In brief; Tavio Majka Disheris a 79 y.o.male with past medical history of dementia, COPD, CAD, A. fib. Patient presented to [facility]  from his extended care facility [facility]) for evaluation of aggressive behavior. Patient was admitted to the ED  10/17/2019.   Patient re-evaluated for psychiatric concerns. During this evaluation, patient is slightly more alert compared to my previous evaluation although he remains confused. At one point during the evaluation, he spoke about a camera altghough there of curse was no cameras around. He preceded to  Reach out in the air as if he was grabbing for something and per sitter who was at bedside, this has been frequent. Sitter reported that patient has been spitting out everything and not drinking. I spoke to patients nurse who confirmed this and noted that patient is not eating much and there was one report that she received that he spit out his medication. As per nurse, she did not receive a report where patient was reaching in the air for things that was not there. As per nurse, patient conitnues to have periods of agitation. As per last MD re-evaluation which was dated 10/27/2019, "Patient is confused and exhibiting mild/moderate agitation at times." Patient has been faxed out to many facilitates for geropsych placement. [place] application was also started. CSW here at [facility] contacted  [facility], requesting a return call to discuss pt's disposition to see if patient will be able return once he is discharged and is awaiting a return phone call. I will discuss with psychiatrist here at [facility] patients plan as there are concerns that he may deteriorate  further while just sitting in the ED awaiting placement. We will also review his medications. At this time, I will continue to recommend inpatient hospitalization until case is discussed with psychiatrists.

## 2019-10-27 NOTE — Clinical Social Work Note (Addendum)
CSW received call from the social worker, Sara Lee, with [facility]. Patient has been psych cleared and [facility] will take the patient back tomorrow after the [facility] and discharge summary are signed and faxed to [facility] via [employer]. Caitlin informed CSW patient will not need a COVID test as patient has already been vaccinated. Transportation will need to be set up in the morning. CSW notified RN, Judye Bos, the physician will need to sign the [facility] and discharge summary.  Addendum: 5:26pm-signed FL2 faxed to [facility].  Tobi Bastos, LCSW Transitions of Care Clinical Social Worker [facility] Ph: [phone]

## 2019-10-27 NOTE — ED Notes (Signed)
Pt attempted to climb over bed rail. Pt also screaming at staff at this time. MD notified.

## 2019-10-27 NOTE — Progress Notes (Signed)
Patient ID: [name] [name], male   DOB: Dec 18, 1940, 79 y.o.   MRN: [id number]   Following psychiatric evaluation today, we recommended that patients wife visit him to see if his symptoms were improving and if in fact, he was back to baseline. Per chart review, patients' wife came to visit for at least an hour today. She fed the patient lunch and he did not spit out food. The patients' wife stated the patient is at his baseline prior to his stay in the ED.  Patient has been in the ED since 3/13/20201. While in the ED, adjustments were made with medications. He has had periods of agitation and restraints were required at times although restraints have not been required for the last 24 hours. As wife has noted that patient is back to baseline and adjustments to medications have been made, there appear to be no need for geropsychiatric placement. Patient is psychiatrically cleared at this time. CSW here at [facility] did follow-up with social worker at [facility] who stated because it was late notice, it is preferred that patient return tomorrow. CSW here at [facility] updated CSW at [facility] on disposition and request for patient to be discharged tomorrow to return back to [facility].   I spoke to Dr. Lacinda Axon and updated him on current disposition and patients return back to the nursing facility to [facility].

## 2019-10-27 NOTE — Progress Notes (Addendum)
CSW received a phone call from [place], [place] at [facility]. They can accept him Wednesday morning. They don't need an updated COVID screen, the patient has been vaccinated.   CSW called CSW Megan at [facility] ED. CSW informed her that the patient has been psych cleared. CSW completed Fl2 and will need to be signed by the MD.   Patient will go to room 508-B and number for report is [phone].   Disposition signing off.   Domenic Schwab, MSW, LCSW-A Clinical Disposition Social Worker [facility]/TTS [phone]

## 2019-10-28 DIAGNOSIS — R4182 Altered mental status, unspecified: Secondary | ICD-10-CM | POA: Diagnosis not present

## 2019-10-28 DIAGNOSIS — M6281 Muscle weakness (generalized): Secondary | ICD-10-CM | POA: Diagnosis not present

## 2019-10-28 DIAGNOSIS — F918 Other conduct disorders: Secondary | ICD-10-CM | POA: Diagnosis not present

## 2019-10-28 DIAGNOSIS — R296 Repeated falls: Secondary | ICD-10-CM | POA: Diagnosis not present

## 2019-10-28 DIAGNOSIS — R456 Violent behavior: Secondary | ICD-10-CM | POA: Diagnosis not present

## 2019-10-28 DIAGNOSIS — G301 Alzheimer's disease with late onset: Secondary | ICD-10-CM | POA: Diagnosis not present

## 2019-10-28 DIAGNOSIS — R1312 Dysphagia, oropharyngeal phase: Secondary | ICD-10-CM | POA: Diagnosis not present

## 2019-10-28 DIAGNOSIS — Z7401 Bed confinement status: Secondary | ICD-10-CM | POA: Diagnosis not present

## 2019-10-28 NOTE — ED Notes (Signed)
Pt unable to sign e sig

## 2019-10-28 NOTE — ED Notes (Signed)
AVS faxed to [facility], staff updated regarding care and meds. Report given to nurse at [facility].

## 2019-10-28 NOTE — Discharge Instructions (Addendum)
You were seen in the emergency department today with aggressive behavior.  We have restarted your medications.  You were found to have an elevated sodium level here.  Please continue to push hydrating fluids whenever possible.  You will need to have repeat lab work done in the next 24 hours to reassess your sodium.  The reading today was 152 but is expected to improve with hydration.

## 2019-10-28 NOTE — ED Provider Notes (Addendum)
Emergency Medicine Observation Re-evaluation Note  [name] [name] is a 79 y.o. male, seen on rounds today.  Pt initially presented to the ED for complaints of Aggressive Behavior Currently, the patient is awaiting geri psych placement.  Physical Exam  BP 113/70 (BP Location: Right Arm)   Pulse 77   Temp 98.2 F (36.8 C) (Oral)   Resp 18   Ht 6' (1.829 m)   Wt 62 kg   SpO2 98%   BMI 18.54 kg/m  Physical Exam   Alert and redirectable. No acute distress.  Even, un-labroed respirations.   ED Course / MDM  EKG:EKG Interpretation  Date/Time:  Wednesday October 21 2019 13:53:09 EDT Ventricular Rate:  76 PR Interval:  168 QRS Duration: 86 QT Interval:  382 QTC Calculation: 429 R Axis:   -24 Text Interpretation: Normal sinus rhythm with sinus arrhythmia Minimal voltage criteria for LVH, may be normal variant ( Cornell product ) Nonspecific ST abnormality Abnormal ECG Confirmed by Nat Christen [id number]) on 10/22/2019 8:39:43 AM    I have reviewed the labs performed to date as well as medications administered while in observation.  Recent changes in the last 24 hours include N/A. Plan  Current plan is for repeat chemistry in the AM to follow up-trending Na. Continuing to encourage PO fluids. Will order AM labs.   09:42 AM  Notify the patient has been accepted back to his nursing facility, [facility].  I have listed the hypernatremia diagnosis and advised that labs be repeated there in 24 hours to follow his sodium levels.  Also advised that they continue to push hydrating fluids PO with close PCP follow up.    Margette Fast, MD 10/28/19 [id number]    Margette Fast, MD 10/28/19 [phone]

## 2019-10-28 NOTE — Progress Notes (Signed)
CSW left VM for Shannon at [facility] to call back to arrange for discharge and transfer. CSW awaiting call back.   [facility] Transitions of Care  Clinical Social Worker  Ph: [phone]

## 2019-10-29 DIAGNOSIS — N400 Enlarged prostate without lower urinary tract symptoms: Secondary | ICD-10-CM | POA: Diagnosis not present

## 2019-10-29 DIAGNOSIS — I480 Paroxysmal atrial fibrillation: Secondary | ICD-10-CM | POA: Diagnosis not present

## 2019-10-29 DIAGNOSIS — J439 Emphysema, unspecified: Secondary | ICD-10-CM | POA: Diagnosis not present

## 2019-10-29 DIAGNOSIS — F0391 Unspecified dementia with behavioral disturbance: Secondary | ICD-10-CM | POA: Diagnosis not present

## 2019-10-29 DIAGNOSIS — U071 COVID-19: Secondary | ICD-10-CM | POA: Diagnosis not present

## 2019-10-30 DIAGNOSIS — D649 Anemia, unspecified: Secondary | ICD-10-CM | POA: Diagnosis not present

## 2019-10-30 DIAGNOSIS — M11262 Other chondrocalcinosis, left knee: Secondary | ICD-10-CM | POA: Diagnosis not present

## 2019-10-30 DIAGNOSIS — Z96651 Presence of right artificial knee joint: Secondary | ICD-10-CM | POA: Diagnosis not present

## 2019-10-30 DIAGNOSIS — M25561 Pain in right knee: Secondary | ICD-10-CM | POA: Diagnosis not present

## 2019-10-30 DIAGNOSIS — M25562 Pain in left knee: Secondary | ICD-10-CM | POA: Diagnosis not present

## 2019-10-30 DIAGNOSIS — Z79899 Other long term (current) drug therapy: Secondary | ICD-10-CM | POA: Diagnosis not present

## 2019-10-30 DIAGNOSIS — J189 Pneumonia, unspecified organism: Secondary | ICD-10-CM | POA: Diagnosis not present

## 2019-10-31 DIAGNOSIS — N170 Acute kidney failure with tubular necrosis: Secondary | ICD-10-CM | POA: Diagnosis not present

## 2019-10-31 DIAGNOSIS — A498 Other bacterial infections of unspecified site: Secondary | ICD-10-CM | POA: Diagnosis not present

## 2019-10-31 DIAGNOSIS — R627 Adult failure to thrive: Secondary | ICD-10-CM | POA: Diagnosis not present

## 2019-10-31 DIAGNOSIS — E870 Hyperosmolality and hypernatremia: Secondary | ICD-10-CM | POA: Diagnosis not present

## 2019-10-31 DIAGNOSIS — R634 Abnormal weight loss: Secondary | ICD-10-CM | POA: Diagnosis not present

## 2019-10-31 DIAGNOSIS — F0391 Unspecified dementia with behavioral disturbance: Secondary | ICD-10-CM | POA: Diagnosis not present

## 2019-11-02 DIAGNOSIS — E860 Dehydration: Secondary | ICD-10-CM | POA: Diagnosis not present

## 2019-11-02 DIAGNOSIS — J189 Pneumonia, unspecified organism: Secondary | ICD-10-CM | POA: Diagnosis not present

## 2019-11-02 DIAGNOSIS — R1310 Dysphagia, unspecified: Secondary | ICD-10-CM | POA: Diagnosis not present

## 2019-11-02 DIAGNOSIS — R627 Adult failure to thrive: Secondary | ICD-10-CM | POA: Diagnosis not present

## 2019-11-04 DIAGNOSIS — R634 Abnormal weight loss: Secondary | ICD-10-CM | POA: Diagnosis not present

## 2019-11-04 DIAGNOSIS — N170 Acute kidney failure with tubular necrosis: Secondary | ICD-10-CM | POA: Diagnosis not present

## 2019-11-04 DIAGNOSIS — F0391 Unspecified dementia with behavioral disturbance: Secondary | ICD-10-CM | POA: Diagnosis not present

## 2019-11-04 DIAGNOSIS — R627 Adult failure to thrive: Secondary | ICD-10-CM | POA: Diagnosis not present

## 2019-11-04 DIAGNOSIS — E870 Hyperosmolality and hypernatremia: Secondary | ICD-10-CM | POA: Diagnosis not present

## 2019-11-05 DIAGNOSIS — R296 Repeated falls: Secondary | ICD-10-CM | POA: Diagnosis not present

## 2019-11-05 DIAGNOSIS — G301 Alzheimer's disease with late onset: Secondary | ICD-10-CM | POA: Diagnosis not present

## 2019-11-05 DIAGNOSIS — R1312 Dysphagia, oropharyngeal phase: Secondary | ICD-10-CM | POA: Diagnosis not present

## 2019-11-05 DIAGNOSIS — M6281 Muscle weakness (generalized): Secondary | ICD-10-CM | POA: Diagnosis not present

## 2019-12-05 DEATH — deceased

## 2020-03-21 IMAGING — CR DG CHEST 1V PORT
1 series · 2 of 2 positions shown · non-contrast
Comparison: Portable exam 7273 hours repeated at 6615 hours
compared to 04/25/2018

CLINICAL DATA: Found on floor at nursing facility, unwitnessed fall
dementia

EXAM:
PORTABLE CHEST 1 VIEW

[Series 1: portable · 0.17mm/px · 2 of 2 slices shown]
[im 1/2]
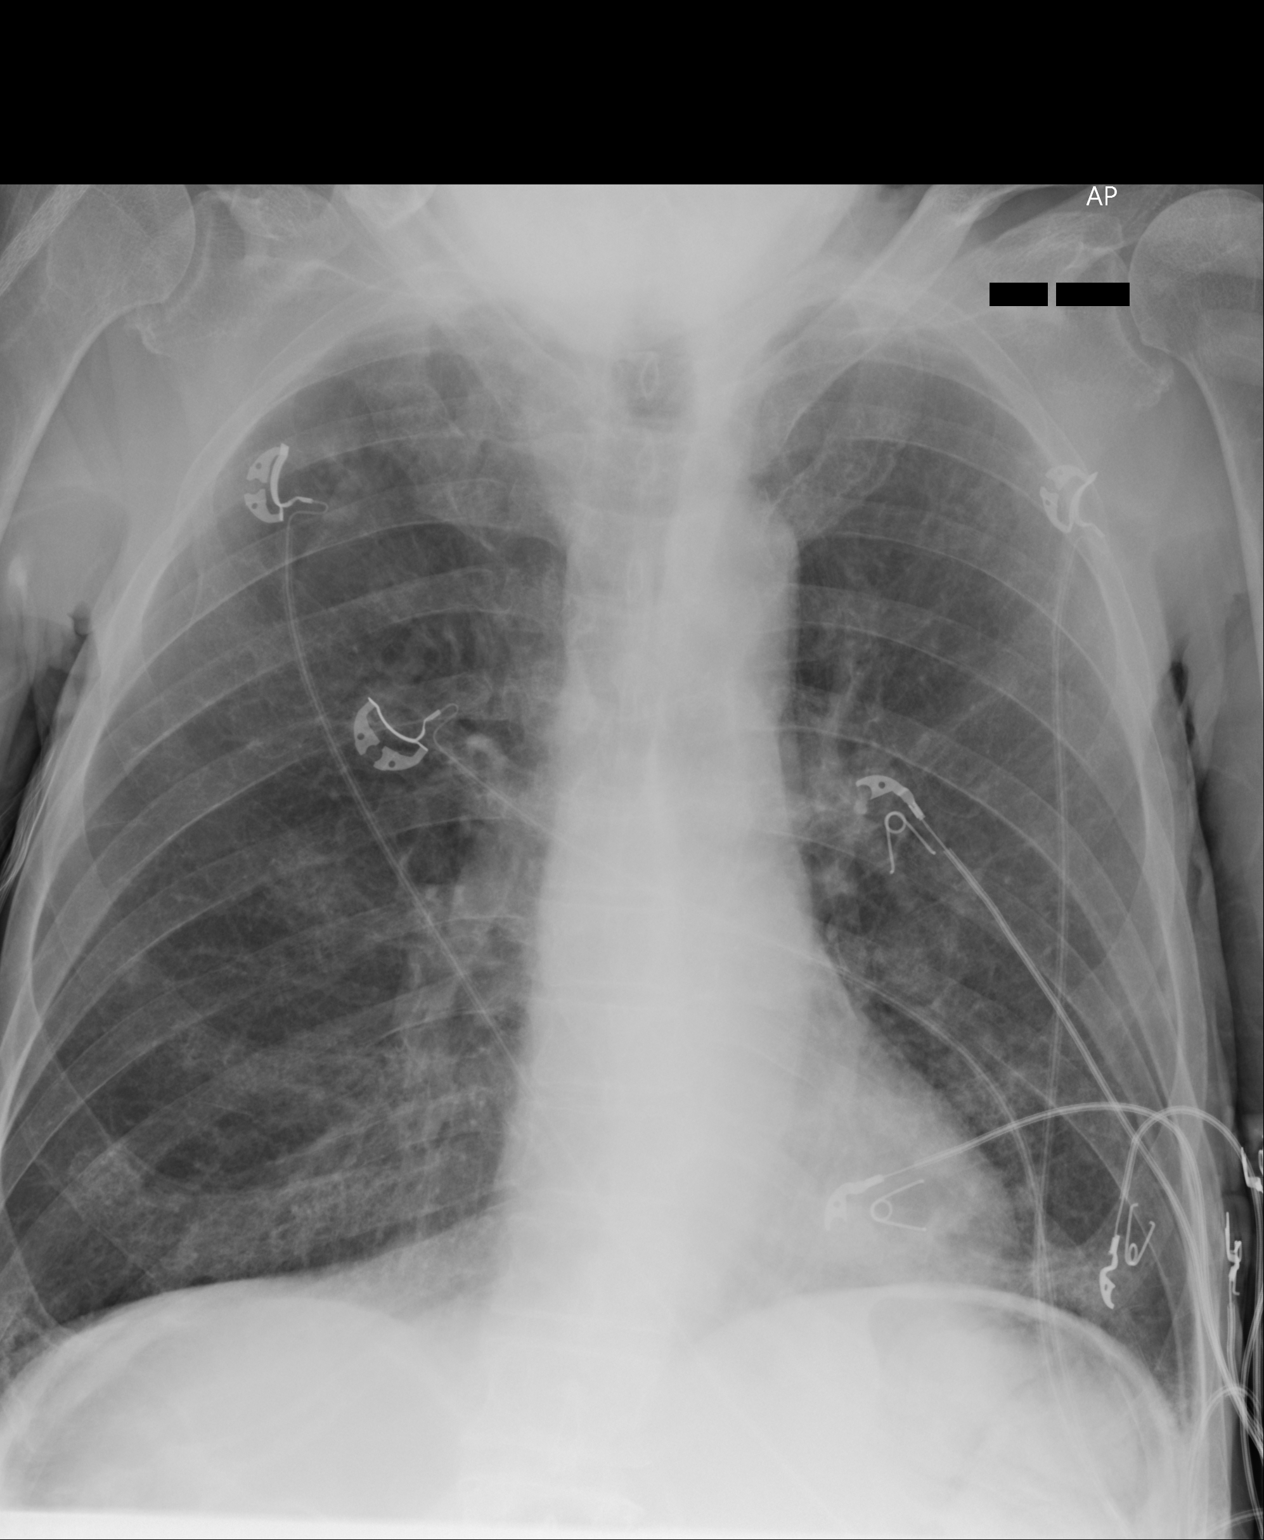
[im 2/2]
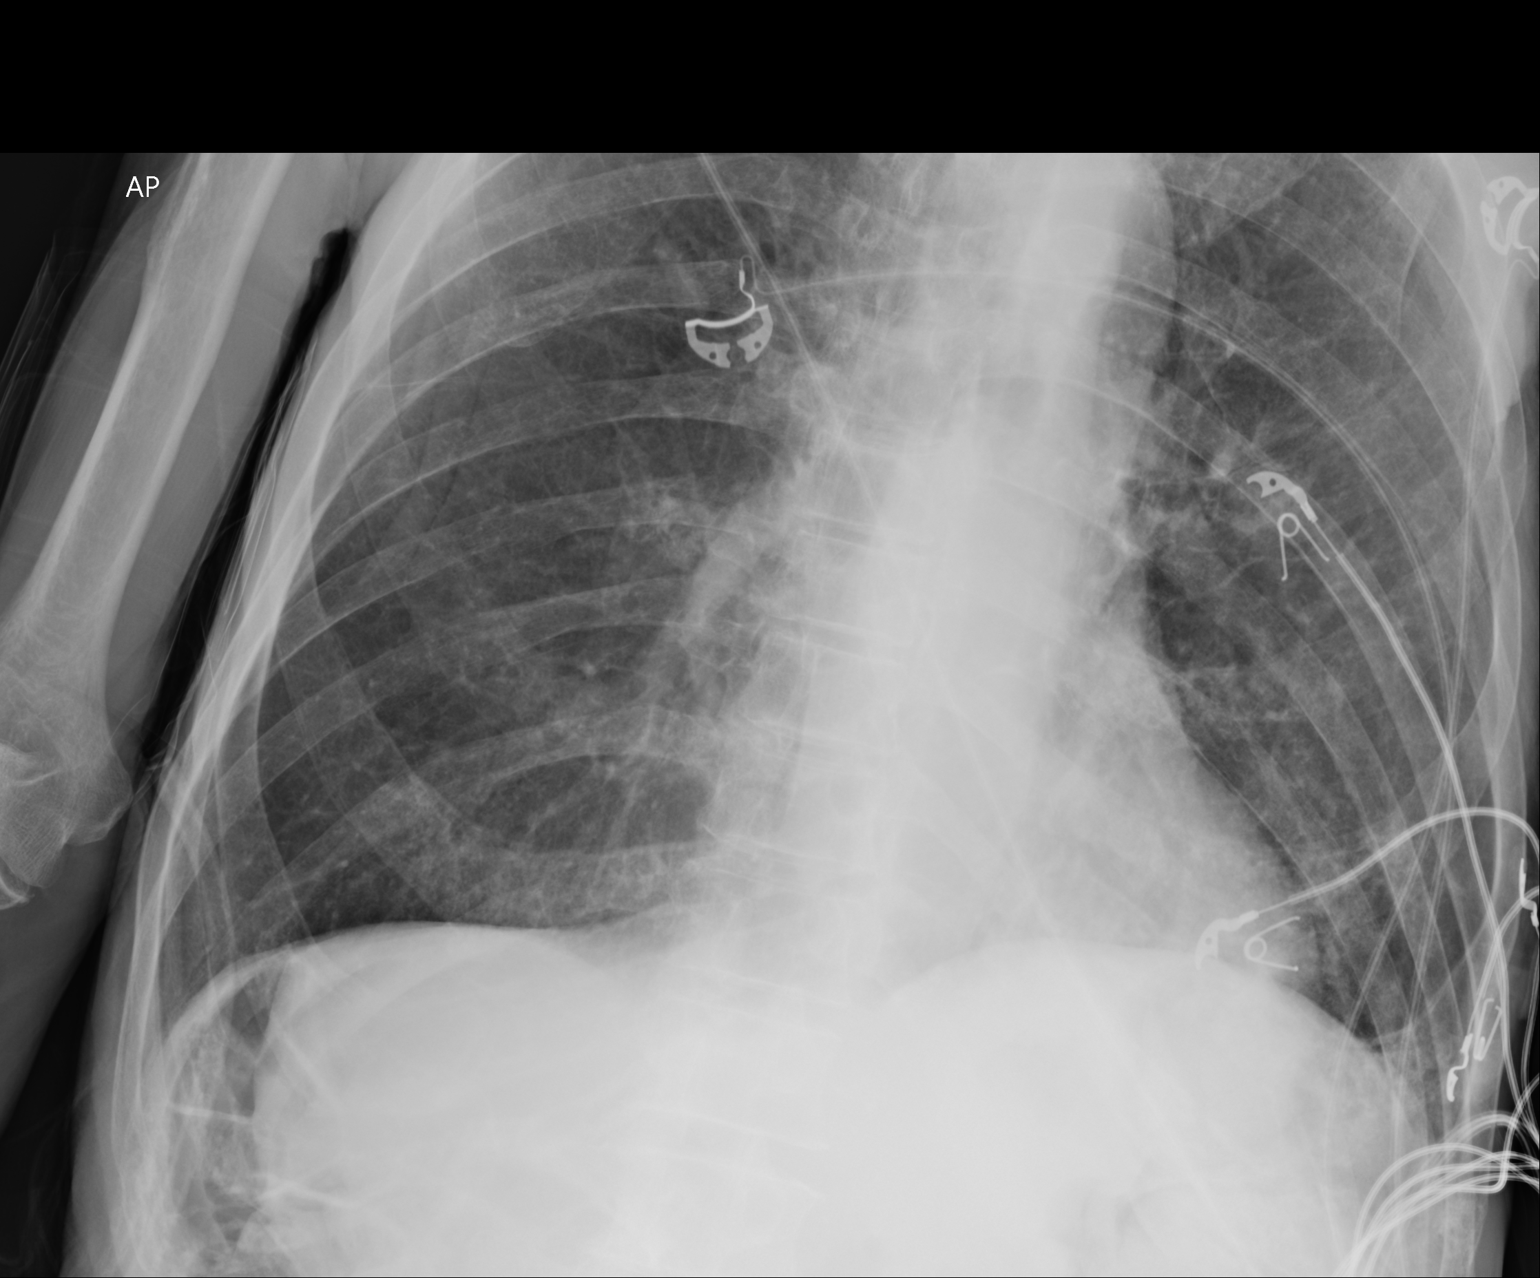

[2 of 2 positions shown; findings below may reference images not displayed]

FINDINGS: Normal heart size, mediastinal contours, and pulmonary vascularity.

Minimal LEFT basilar atelectasis.

Lungs otherwise clear.

No definite infiltrate, pleural effusion or pneumothorax.

Bones demineralized.

Old healed mid RIGHT clavicular fracture.

Colon interposition between liver and diaphragm.
IMPRESSION: LEFT basilar atelectasis.

Otherwise negative exam.
